# Patient Record
Sex: Female | Born: 1940 | Race: White | Hispanic: No | Marital: Married | Smoking: Never smoker
Health system: Southern US, Community
[De-identification: ages and names within clinical notes are randomized; demographics above are authoritative.]

## PROBLEM LIST (undated history)

## (undated) DIAGNOSIS — S4992XA Unspecified injury of left shoulder and upper arm, initial encounter: Secondary | ICD-10-CM

## (undated) DIAGNOSIS — Z8719 Personal history of other diseases of the digestive system: Secondary | ICD-10-CM

## (undated) DIAGNOSIS — B029 Zoster without complications: Secondary | ICD-10-CM

## (undated) DIAGNOSIS — K219 Gastro-esophageal reflux disease without esophagitis: Secondary | ICD-10-CM

## (undated) DIAGNOSIS — Z8669 Personal history of other diseases of the nervous system and sense organs: Secondary | ICD-10-CM

## (undated) DIAGNOSIS — F329 Major depressive disorder, single episode, unspecified: Secondary | ICD-10-CM

## (undated) DIAGNOSIS — M25561 Pain in right knee: Secondary | ICD-10-CM

## (undated) DIAGNOSIS — H02409 Unspecified ptosis of unspecified eyelid: Secondary | ICD-10-CM

## (undated) DIAGNOSIS — H5440 Blindness, one eye, unspecified eye: Secondary | ICD-10-CM

## (undated) DIAGNOSIS — E039 Hypothyroidism, unspecified: Secondary | ICD-10-CM

## (undated) DIAGNOSIS — K5792 Diverticulitis of intestine, part unspecified, without perforation or abscess without bleeding: Secondary | ICD-10-CM

## (undated) DIAGNOSIS — F32A Depression, unspecified: Secondary | ICD-10-CM

## (undated) DIAGNOSIS — C9210 Chronic myeloid leukemia, BCR/ABL-positive, not having achieved remission: Secondary | ICD-10-CM

## (undated) DIAGNOSIS — M1990 Unspecified osteoarthritis, unspecified site: Secondary | ICD-10-CM

## (undated) DIAGNOSIS — Z856 Personal history of leukemia: Secondary | ICD-10-CM

## (undated) DIAGNOSIS — Z973 Presence of spectacles and contact lenses: Secondary | ICD-10-CM

## (undated) HISTORY — PX: OTHER SURGICAL HISTORY: SHX169

## (undated) HISTORY — PX: COLONOSCOPY: SHX174

## (undated) HISTORY — DX: Unspecified ptosis of unspecified eyelid: H02.409

## (undated) HISTORY — DX: Chronic myeloid leukemia, BCR/ABL-positive, not having achieved remission: C92.10

## (undated) HISTORY — PX: APPENDECTOMY: SHX54

## (undated) HISTORY — DX: Unspecified osteoarthritis, unspecified site: M19.90

## (undated) HISTORY — DX: Blindness, one eye, unspecified eye: H54.40

## (undated) HISTORY — DX: Unspecified injury of left shoulder and upper arm, initial encounter: S49.92XA

## (undated) HISTORY — DX: Pain in right knee: M25.561

## (undated) HISTORY — DX: Personal history of other diseases of the nervous system and sense organs: Z86.69

---

## 1978-07-11 HISTORY — PX: OTHER SURGICAL HISTORY: SHX169

## 1998-11-19 ENCOUNTER — Inpatient Hospital Stay (HOSPITAL_COMMUNITY): Admission: AD | Admit: 1998-11-19 | Discharge: 1998-11-21 | Payer: Self-pay | Admitting: *Deleted

## 1998-11-20 ENCOUNTER — Encounter: Payer: Self-pay | Admitting: *Deleted

## 2001-03-30 ENCOUNTER — Ambulatory Visit (HOSPITAL_COMMUNITY): Admission: RE | Admit: 2001-03-30 | Discharge: 2001-03-30 | Payer: Self-pay | Admitting: Internal Medicine

## 2001-03-30 ENCOUNTER — Encounter: Admission: RE | Admit: 2001-03-30 | Discharge: 2001-03-30 | Payer: Self-pay | Admitting: Oncology

## 2001-03-30 ENCOUNTER — Encounter (INDEPENDENT_AMBULATORY_CARE_PROVIDER_SITE_OTHER): Payer: Self-pay | Admitting: Internal Medicine

## 2001-03-30 ENCOUNTER — Encounter (HOSPITAL_COMMUNITY): Admission: RE | Admit: 2001-03-30 | Discharge: 2001-04-29 | Payer: Self-pay | Admitting: Oncology

## 2001-04-30 ENCOUNTER — Encounter (HOSPITAL_COMMUNITY): Admission: RE | Admit: 2001-04-30 | Discharge: 2001-05-30 | Payer: Self-pay | Admitting: Oncology

## 2001-04-30 ENCOUNTER — Encounter: Admission: RE | Admit: 2001-04-30 | Discharge: 2001-04-30 | Payer: Self-pay | Admitting: Oncology

## 2001-06-12 ENCOUNTER — Encounter (HOSPITAL_COMMUNITY): Admission: RE | Admit: 2001-06-12 | Discharge: 2001-07-12 | Payer: Self-pay | Admitting: Oncology

## 2001-06-12 ENCOUNTER — Encounter: Admission: RE | Admit: 2001-06-12 | Discharge: 2001-06-12 | Payer: Self-pay | Admitting: Oncology

## 2001-07-25 ENCOUNTER — Encounter (HOSPITAL_COMMUNITY): Admission: RE | Admit: 2001-07-25 | Discharge: 2001-08-24 | Payer: Self-pay | Admitting: Oncology

## 2001-07-25 ENCOUNTER — Encounter: Admission: RE | Admit: 2001-07-25 | Discharge: 2001-07-25 | Payer: Self-pay | Admitting: Oncology

## 2001-07-28 ENCOUNTER — Encounter: Payer: Self-pay | Admitting: Internal Medicine

## 2001-07-28 ENCOUNTER — Emergency Department (HOSPITAL_COMMUNITY): Admission: EM | Admit: 2001-07-28 | Discharge: 2001-07-28 | Payer: Self-pay | Admitting: Internal Medicine

## 2001-08-09 ENCOUNTER — Ambulatory Visit (HOSPITAL_COMMUNITY): Admission: RE | Admit: 2001-08-09 | Discharge: 2001-08-09 | Payer: Self-pay | Admitting: Pulmonary Disease

## 2001-08-31 ENCOUNTER — Encounter: Admission: RE | Admit: 2001-08-31 | Discharge: 2001-08-31 | Payer: Self-pay | Admitting: Oncology

## 2001-08-31 ENCOUNTER — Encounter (HOSPITAL_COMMUNITY): Admission: RE | Admit: 2001-08-31 | Discharge: 2001-09-30 | Payer: Self-pay | Admitting: Oncology

## 2001-10-22 ENCOUNTER — Encounter (HOSPITAL_COMMUNITY): Admission: RE | Admit: 2001-10-22 | Discharge: 2001-11-21 | Payer: Self-pay | Admitting: Oncology

## 2001-10-22 ENCOUNTER — Encounter: Admission: RE | Admit: 2001-10-22 | Discharge: 2001-10-22 | Payer: Self-pay | Admitting: Oncology

## 2001-10-31 ENCOUNTER — Observation Stay (HOSPITAL_COMMUNITY): Admission: AD | Admit: 2001-10-31 | Discharge: 2001-11-02 | Payer: Self-pay | Admitting: Pulmonary Disease

## 2001-11-27 ENCOUNTER — Encounter (HOSPITAL_COMMUNITY): Admission: RE | Admit: 2001-11-27 | Discharge: 2001-12-27 | Payer: Self-pay | Admitting: Oncology

## 2001-11-27 ENCOUNTER — Encounter: Admission: RE | Admit: 2001-11-27 | Discharge: 2001-11-27 | Payer: Self-pay | Admitting: Oncology

## 2001-12-31 ENCOUNTER — Encounter: Admission: RE | Admit: 2001-12-31 | Discharge: 2001-12-31 | Payer: Self-pay | Admitting: Oncology

## 2001-12-31 ENCOUNTER — Encounter (HOSPITAL_COMMUNITY): Admission: RE | Admit: 2001-12-31 | Discharge: 2002-01-30 | Payer: Self-pay | Admitting: Oncology

## 2002-02-06 ENCOUNTER — Encounter: Admission: RE | Admit: 2002-02-06 | Discharge: 2002-02-06 | Payer: Self-pay | Admitting: Oncology

## 2002-02-06 ENCOUNTER — Encounter (HOSPITAL_COMMUNITY): Admission: RE | Admit: 2002-02-06 | Discharge: 2002-03-08 | Payer: Self-pay | Admitting: Oncology

## 2002-03-27 ENCOUNTER — Encounter: Admission: RE | Admit: 2002-03-27 | Discharge: 2002-03-27 | Payer: Self-pay | Admitting: Oncology

## 2002-03-27 ENCOUNTER — Encounter (HOSPITAL_COMMUNITY): Admission: RE | Admit: 2002-03-27 | Discharge: 2002-04-26 | Payer: Self-pay | Admitting: Oncology

## 2002-04-09 ENCOUNTER — Ambulatory Visit (HOSPITAL_COMMUNITY): Admission: RE | Admit: 2002-04-09 | Discharge: 2002-04-09 | Payer: Self-pay | Admitting: Internal Medicine

## 2002-04-10 ENCOUNTER — Encounter (INDEPENDENT_AMBULATORY_CARE_PROVIDER_SITE_OTHER): Payer: Self-pay | Admitting: Internal Medicine

## 2002-04-10 ENCOUNTER — Ambulatory Visit (HOSPITAL_COMMUNITY): Admission: RE | Admit: 2002-04-10 | Discharge: 2002-04-10 | Payer: Self-pay | Admitting: Internal Medicine

## 2002-04-26 ENCOUNTER — Encounter (HOSPITAL_COMMUNITY): Admission: RE | Admit: 2002-04-26 | Discharge: 2002-05-26 | Payer: Self-pay | Admitting: Oncology

## 2002-04-26 ENCOUNTER — Encounter: Admission: RE | Admit: 2002-04-26 | Discharge: 2002-04-26 | Payer: Self-pay | Admitting: Oncology

## 2002-05-24 ENCOUNTER — Encounter: Payer: Self-pay | Admitting: General Surgery

## 2002-05-24 ENCOUNTER — Ambulatory Visit (HOSPITAL_COMMUNITY): Admission: RE | Admit: 2002-05-24 | Discharge: 2002-05-24 | Payer: Self-pay | Admitting: General Surgery

## 2002-06-10 ENCOUNTER — Encounter: Admission: RE | Admit: 2002-06-10 | Discharge: 2002-06-10 | Payer: Self-pay | Admitting: Oncology

## 2002-06-10 ENCOUNTER — Encounter (HOSPITAL_COMMUNITY): Admission: RE | Admit: 2002-06-10 | Discharge: 2002-07-10 | Payer: Self-pay | Admitting: Oncology

## 2002-06-21 ENCOUNTER — Ambulatory Visit (HOSPITAL_COMMUNITY): Admission: RE | Admit: 2002-06-21 | Discharge: 2002-06-21 | Payer: Self-pay | Admitting: Cardiology

## 2002-07-17 ENCOUNTER — Encounter: Admission: RE | Admit: 2002-07-17 | Discharge: 2002-07-17 | Payer: Self-pay | Admitting: Oncology

## 2002-07-17 ENCOUNTER — Encounter (HOSPITAL_COMMUNITY): Admission: RE | Admit: 2002-07-17 | Discharge: 2002-08-16 | Payer: Self-pay | Admitting: Oncology

## 2002-08-19 ENCOUNTER — Encounter (HOSPITAL_COMMUNITY): Admission: RE | Admit: 2002-08-19 | Discharge: 2002-09-18 | Payer: Self-pay | Admitting: Oncology

## 2002-08-19 ENCOUNTER — Encounter: Admission: RE | Admit: 2002-08-19 | Discharge: 2002-08-19 | Payer: Self-pay | Admitting: Oncology

## 2002-09-30 ENCOUNTER — Encounter: Admission: RE | Admit: 2002-09-30 | Discharge: 2002-09-30 | Payer: Self-pay | Admitting: Oncology

## 2002-09-30 ENCOUNTER — Encounter (HOSPITAL_COMMUNITY): Admission: RE | Admit: 2002-09-30 | Discharge: 2002-10-30 | Payer: Self-pay | Admitting: Oncology

## 2002-11-04 ENCOUNTER — Encounter (HOSPITAL_COMMUNITY): Payer: Self-pay | Admitting: Oncology

## 2002-11-04 ENCOUNTER — Encounter (HOSPITAL_COMMUNITY): Admission: RE | Admit: 2002-11-04 | Discharge: 2002-12-04 | Payer: Self-pay | Admitting: Oncology

## 2002-11-05 ENCOUNTER — Encounter: Admission: RE | Admit: 2002-11-05 | Discharge: 2002-11-05 | Payer: Self-pay | Admitting: Oncology

## 2002-11-09 ENCOUNTER — Emergency Department (HOSPITAL_COMMUNITY): Admission: EM | Admit: 2002-11-09 | Discharge: 2002-11-09 | Payer: Self-pay | Admitting: Emergency Medicine

## 2002-11-09 ENCOUNTER — Encounter: Payer: Self-pay | Admitting: Emergency Medicine

## 2002-12-10 ENCOUNTER — Encounter (HOSPITAL_COMMUNITY): Admission: RE | Admit: 2002-12-10 | Discharge: 2003-01-09 | Payer: Self-pay | Admitting: Oncology

## 2002-12-10 ENCOUNTER — Encounter: Admission: RE | Admit: 2002-12-10 | Discharge: 2002-12-10 | Payer: Self-pay | Admitting: Oncology

## 2002-12-11 ENCOUNTER — Ambulatory Visit (HOSPITAL_COMMUNITY): Admission: RE | Admit: 2002-12-11 | Discharge: 2002-12-11 | Payer: Self-pay | Admitting: Oncology

## 2002-12-11 ENCOUNTER — Encounter (HOSPITAL_COMMUNITY): Payer: Self-pay | Admitting: Oncology

## 2003-01-20 ENCOUNTER — Encounter (HOSPITAL_COMMUNITY): Admission: RE | Admit: 2003-01-20 | Discharge: 2003-02-19 | Payer: Self-pay | Admitting: Oncology

## 2003-01-20 ENCOUNTER — Encounter: Admission: RE | Admit: 2003-01-20 | Discharge: 2003-01-20 | Payer: Self-pay | Admitting: Oncology

## 2003-03-06 ENCOUNTER — Encounter (HOSPITAL_COMMUNITY): Admission: RE | Admit: 2003-03-06 | Discharge: 2003-04-05 | Payer: Self-pay | Admitting: Oncology

## 2003-03-06 ENCOUNTER — Encounter: Admission: RE | Admit: 2003-03-06 | Discharge: 2003-03-06 | Payer: Self-pay | Admitting: Oncology

## 2003-04-11 ENCOUNTER — Encounter (HOSPITAL_COMMUNITY): Admission: RE | Admit: 2003-04-11 | Discharge: 2003-05-11 | Payer: Self-pay | Admitting: Oncology

## 2003-04-11 ENCOUNTER — Encounter: Admission: RE | Admit: 2003-04-11 | Discharge: 2003-04-11 | Payer: Self-pay | Admitting: Oncology

## 2003-05-19 ENCOUNTER — Encounter (HOSPITAL_COMMUNITY): Admission: RE | Admit: 2003-05-19 | Discharge: 2003-06-18 | Payer: Self-pay | Admitting: Oncology

## 2003-05-19 ENCOUNTER — Encounter: Admission: RE | Admit: 2003-05-19 | Discharge: 2003-05-19 | Payer: Self-pay | Admitting: Oncology

## 2003-05-22 ENCOUNTER — Ambulatory Visit (HOSPITAL_COMMUNITY): Admission: RE | Admit: 2003-05-22 | Discharge: 2003-05-22 | Payer: Self-pay | Admitting: Pulmonary Disease

## 2003-06-25 ENCOUNTER — Encounter: Admission: RE | Admit: 2003-06-25 | Discharge: 2003-06-25 | Payer: Self-pay | Admitting: Oncology

## 2003-06-25 ENCOUNTER — Encounter (HOSPITAL_COMMUNITY): Admission: RE | Admit: 2003-06-25 | Discharge: 2003-07-25 | Payer: Self-pay | Admitting: Oncology

## 2003-08-07 ENCOUNTER — Encounter (HOSPITAL_COMMUNITY): Admission: RE | Admit: 2003-08-07 | Discharge: 2003-09-06 | Payer: Self-pay | Admitting: Oncology

## 2003-08-07 ENCOUNTER — Encounter: Admission: RE | Admit: 2003-08-07 | Discharge: 2003-08-07 | Payer: Self-pay | Admitting: Oncology

## 2003-09-04 ENCOUNTER — Ambulatory Visit (HOSPITAL_COMMUNITY): Admission: RE | Admit: 2003-09-04 | Discharge: 2003-09-04 | Payer: Self-pay | Admitting: Pulmonary Disease

## 2003-09-09 ENCOUNTER — Encounter (HOSPITAL_COMMUNITY): Admission: RE | Admit: 2003-09-09 | Discharge: 2003-10-09 | Payer: Self-pay | Admitting: Oncology

## 2003-09-09 ENCOUNTER — Encounter: Admission: RE | Admit: 2003-09-09 | Discharge: 2003-09-09 | Payer: Self-pay | Admitting: Oncology

## 2003-11-12 ENCOUNTER — Encounter (HOSPITAL_COMMUNITY): Admission: RE | Admit: 2003-11-12 | Discharge: 2003-12-12 | Payer: Self-pay | Admitting: Oncology

## 2003-11-12 ENCOUNTER — Encounter: Admission: RE | Admit: 2003-11-12 | Discharge: 2003-11-12 | Payer: Self-pay | Admitting: Oncology

## 2003-12-23 ENCOUNTER — Encounter: Admission: RE | Admit: 2003-12-23 | Discharge: 2003-12-23 | Payer: Self-pay | Admitting: Oncology

## 2003-12-23 ENCOUNTER — Encounter (HOSPITAL_COMMUNITY): Admission: RE | Admit: 2003-12-23 | Discharge: 2004-01-22 | Payer: Self-pay | Admitting: Oncology

## 2004-02-04 ENCOUNTER — Encounter: Admission: RE | Admit: 2004-02-04 | Discharge: 2004-02-04 | Payer: Self-pay | Admitting: Oncology

## 2004-02-04 ENCOUNTER — Encounter (HOSPITAL_COMMUNITY): Admission: RE | Admit: 2004-02-04 | Discharge: 2004-03-05 | Payer: Self-pay | Admitting: Oncology

## 2004-03-23 ENCOUNTER — Encounter: Admission: RE | Admit: 2004-03-23 | Discharge: 2004-04-09 | Payer: Self-pay | Admitting: Oncology

## 2004-03-23 ENCOUNTER — Encounter (HOSPITAL_COMMUNITY): Admission: RE | Admit: 2004-03-23 | Discharge: 2004-04-09 | Payer: Self-pay | Admitting: Oncology

## 2004-04-20 ENCOUNTER — Encounter (HOSPITAL_COMMUNITY): Admission: RE | Admit: 2004-04-20 | Discharge: 2004-05-20 | Payer: Self-pay | Admitting: Oncology

## 2004-04-20 ENCOUNTER — Encounter: Admission: RE | Admit: 2004-04-20 | Discharge: 2004-04-20 | Payer: Self-pay | Admitting: Oncology

## 2004-05-19 ENCOUNTER — Ambulatory Visit (HOSPITAL_COMMUNITY): Payer: Self-pay | Admitting: Oncology

## 2004-05-25 ENCOUNTER — Encounter: Admission: RE | Admit: 2004-05-25 | Discharge: 2004-05-25 | Payer: Self-pay | Admitting: Oncology

## 2004-05-25 ENCOUNTER — Encounter (HOSPITAL_COMMUNITY): Admission: RE | Admit: 2004-05-25 | Discharge: 2004-06-24 | Payer: Self-pay | Admitting: Oncology

## 2004-07-20 ENCOUNTER — Encounter (HOSPITAL_COMMUNITY): Admission: RE | Admit: 2004-07-20 | Discharge: 2004-08-19 | Payer: Self-pay | Admitting: Oncology

## 2004-07-20 ENCOUNTER — Encounter: Admission: RE | Admit: 2004-07-20 | Discharge: 2004-07-20 | Payer: Self-pay | Admitting: Oncology

## 2004-07-21 ENCOUNTER — Ambulatory Visit (HOSPITAL_COMMUNITY): Payer: Self-pay | Admitting: Oncology

## 2004-09-06 ENCOUNTER — Ambulatory Visit (HOSPITAL_COMMUNITY): Payer: Self-pay | Admitting: Oncology

## 2004-09-06 ENCOUNTER — Encounter (HOSPITAL_COMMUNITY): Admission: RE | Admit: 2004-09-06 | Discharge: 2004-10-06 | Payer: Self-pay | Admitting: Oncology

## 2004-09-06 ENCOUNTER — Encounter: Admission: RE | Admit: 2004-09-06 | Discharge: 2004-09-06 | Payer: Self-pay | Admitting: Oncology

## 2004-10-19 ENCOUNTER — Encounter (HOSPITAL_COMMUNITY): Admission: RE | Admit: 2004-10-19 | Discharge: 2004-11-18 | Payer: Self-pay | Admitting: Oncology

## 2004-10-19 ENCOUNTER — Encounter: Admission: RE | Admit: 2004-10-19 | Discharge: 2004-10-19 | Payer: Self-pay | Admitting: Oncology

## 2004-11-14 ENCOUNTER — Emergency Department (HOSPITAL_COMMUNITY): Admission: EM | Admit: 2004-11-14 | Discharge: 2004-11-14 | Payer: Self-pay | Admitting: Emergency Medicine

## 2004-11-18 ENCOUNTER — Ambulatory Visit (HOSPITAL_COMMUNITY): Payer: Self-pay | Admitting: Oncology

## 2004-12-15 ENCOUNTER — Encounter (HOSPITAL_COMMUNITY): Admission: RE | Admit: 2004-12-15 | Discharge: 2005-01-14 | Payer: Self-pay | Admitting: Oncology

## 2004-12-15 ENCOUNTER — Encounter: Admission: RE | Admit: 2004-12-15 | Discharge: 2004-12-15 | Payer: Self-pay | Admitting: Oncology

## 2005-01-14 ENCOUNTER — Ambulatory Visit (HOSPITAL_COMMUNITY): Payer: Self-pay | Admitting: Oncology

## 2005-02-10 ENCOUNTER — Encounter (HOSPITAL_COMMUNITY): Admission: RE | Admit: 2005-02-10 | Discharge: 2005-03-12 | Payer: Self-pay | Admitting: Oncology

## 2005-02-10 ENCOUNTER — Encounter: Admission: RE | Admit: 2005-02-10 | Discharge: 2005-02-10 | Payer: Self-pay | Admitting: Oncology

## 2005-03-11 ENCOUNTER — Ambulatory Visit (HOSPITAL_COMMUNITY): Payer: Self-pay | Admitting: Oncology

## 2005-03-28 ENCOUNTER — Encounter: Admission: RE | Admit: 2005-03-28 | Discharge: 2005-04-08 | Payer: Self-pay | Admitting: Oncology

## 2005-03-28 ENCOUNTER — Encounter (HOSPITAL_COMMUNITY): Admission: RE | Admit: 2005-03-28 | Discharge: 2005-04-08 | Payer: Self-pay | Admitting: Oncology

## 2005-04-18 ENCOUNTER — Ambulatory Visit (HOSPITAL_COMMUNITY): Admission: RE | Admit: 2005-04-18 | Discharge: 2005-04-18 | Payer: Self-pay | Admitting: Pulmonary Disease

## 2005-04-28 ENCOUNTER — Ambulatory Visit (HOSPITAL_COMMUNITY): Payer: Self-pay | Admitting: Oncology

## 2005-04-28 ENCOUNTER — Encounter (HOSPITAL_COMMUNITY): Admission: RE | Admit: 2005-04-28 | Discharge: 2005-05-28 | Payer: Self-pay | Admitting: Oncology

## 2005-04-28 ENCOUNTER — Encounter: Admission: RE | Admit: 2005-04-28 | Discharge: 2005-04-28 | Payer: Self-pay | Admitting: Oncology

## 2005-06-24 ENCOUNTER — Ambulatory Visit (HOSPITAL_COMMUNITY): Payer: Self-pay | Admitting: Oncology

## 2005-06-24 ENCOUNTER — Encounter: Admission: RE | Admit: 2005-06-24 | Discharge: 2005-07-10 | Payer: Self-pay | Admitting: Oncology

## 2005-06-24 ENCOUNTER — Encounter (HOSPITAL_COMMUNITY): Admission: RE | Admit: 2005-06-24 | Discharge: 2005-07-10 | Payer: Self-pay | Admitting: Oncology

## 2005-08-10 ENCOUNTER — Encounter (HOSPITAL_COMMUNITY): Admission: RE | Admit: 2005-08-10 | Discharge: 2005-09-09 | Payer: Self-pay | Admitting: Oncology

## 2005-08-10 ENCOUNTER — Ambulatory Visit (HOSPITAL_COMMUNITY): Payer: Self-pay | Admitting: Oncology

## 2005-08-10 ENCOUNTER — Encounter: Admission: RE | Admit: 2005-08-10 | Discharge: 2005-08-10 | Payer: Self-pay | Admitting: Oncology

## 2005-10-05 ENCOUNTER — Ambulatory Visit (HOSPITAL_COMMUNITY): Payer: Self-pay | Admitting: Oncology

## 2005-10-05 ENCOUNTER — Encounter (HOSPITAL_COMMUNITY): Admission: RE | Admit: 2005-10-05 | Discharge: 2005-11-04 | Payer: Self-pay | Admitting: Oncology

## 2005-10-05 ENCOUNTER — Encounter: Admission: RE | Admit: 2005-10-05 | Discharge: 2005-10-05 | Payer: Self-pay | Admitting: Oncology

## 2005-11-30 ENCOUNTER — Encounter: Admission: RE | Admit: 2005-11-30 | Discharge: 2005-11-30 | Payer: Self-pay | Admitting: Oncology

## 2005-11-30 ENCOUNTER — Encounter (HOSPITAL_COMMUNITY): Admission: RE | Admit: 2005-11-30 | Discharge: 2005-12-30 | Payer: Self-pay | Admitting: Oncology

## 2005-11-30 ENCOUNTER — Ambulatory Visit (HOSPITAL_COMMUNITY): Payer: Self-pay | Admitting: Oncology

## 2006-01-30 ENCOUNTER — Encounter: Admission: RE | Admit: 2006-01-30 | Discharge: 2006-01-30 | Payer: Self-pay | Admitting: Oncology

## 2006-01-30 ENCOUNTER — Encounter (HOSPITAL_COMMUNITY): Admission: RE | Admit: 2006-01-30 | Discharge: 2006-03-01 | Payer: Self-pay | Admitting: Oncology

## 2006-01-30 ENCOUNTER — Ambulatory Visit (HOSPITAL_COMMUNITY): Payer: Self-pay | Admitting: Oncology

## 2006-03-08 ENCOUNTER — Encounter (HOSPITAL_COMMUNITY): Admission: RE | Admit: 2006-03-08 | Discharge: 2006-04-07 | Payer: Self-pay | Admitting: Oncology

## 2006-03-08 ENCOUNTER — Encounter: Admission: RE | Admit: 2006-03-08 | Discharge: 2006-04-07 | Payer: Self-pay | Admitting: Oncology

## 2006-03-30 ENCOUNTER — Ambulatory Visit (HOSPITAL_COMMUNITY): Payer: Self-pay | Admitting: Oncology

## 2006-04-28 ENCOUNTER — Encounter (HOSPITAL_COMMUNITY): Admission: RE | Admit: 2006-04-28 | Discharge: 2006-05-28 | Payer: Self-pay | Admitting: Oncology

## 2006-04-28 ENCOUNTER — Encounter: Admission: RE | Admit: 2006-04-28 | Discharge: 2006-04-28 | Payer: Self-pay | Admitting: Oncology

## 2006-05-21 ENCOUNTER — Emergency Department (HOSPITAL_COMMUNITY): Admission: EM | Admit: 2006-05-21 | Discharge: 2006-05-22 | Payer: Self-pay | Admitting: Emergency Medicine

## 2006-05-30 ENCOUNTER — Ambulatory Visit (HOSPITAL_COMMUNITY): Payer: Self-pay | Admitting: Oncology

## 2006-05-30 ENCOUNTER — Encounter (HOSPITAL_COMMUNITY): Admission: RE | Admit: 2006-05-30 | Discharge: 2006-06-29 | Payer: Self-pay | Admitting: Oncology

## 2006-08-09 ENCOUNTER — Ambulatory Visit (HOSPITAL_COMMUNITY): Payer: Self-pay | Admitting: Oncology

## 2006-09-20 ENCOUNTER — Encounter (HOSPITAL_COMMUNITY): Admission: RE | Admit: 2006-09-20 | Discharge: 2006-10-20 | Payer: Self-pay | Admitting: Oncology

## 2006-11-01 ENCOUNTER — Encounter (HOSPITAL_COMMUNITY): Admission: RE | Admit: 2006-11-01 | Discharge: 2006-12-01 | Payer: Self-pay | Admitting: Oncology

## 2006-11-01 ENCOUNTER — Ambulatory Visit (HOSPITAL_COMMUNITY): Payer: Self-pay | Admitting: Oncology

## 2006-12-15 ENCOUNTER — Encounter (HOSPITAL_COMMUNITY): Admission: RE | Admit: 2006-12-15 | Discharge: 2007-01-14 | Payer: Self-pay | Admitting: Oncology

## 2006-12-25 ENCOUNTER — Ambulatory Visit (HOSPITAL_COMMUNITY): Payer: Self-pay | Admitting: Oncology

## 2007-01-26 ENCOUNTER — Ambulatory Visit (HOSPITAL_COMMUNITY): Admission: RE | Admit: 2007-01-26 | Discharge: 2007-01-26 | Payer: Self-pay | Admitting: Pulmonary Disease

## 2007-01-26 ENCOUNTER — Encounter (HOSPITAL_COMMUNITY): Admission: RE | Admit: 2007-01-26 | Discharge: 2007-02-25 | Payer: Self-pay | Admitting: Oncology

## 2007-03-01 ENCOUNTER — Ambulatory Visit (HOSPITAL_COMMUNITY): Admission: RE | Admit: 2007-03-01 | Discharge: 2007-03-01 | Payer: Self-pay | Admitting: Pulmonary Disease

## 2007-03-26 ENCOUNTER — Ambulatory Visit (HOSPITAL_COMMUNITY): Payer: Self-pay | Admitting: Oncology

## 2007-03-26 ENCOUNTER — Encounter (HOSPITAL_COMMUNITY): Admission: RE | Admit: 2007-03-26 | Discharge: 2007-04-10 | Payer: Self-pay | Admitting: Oncology

## 2007-04-09 ENCOUNTER — Ambulatory Visit (HOSPITAL_COMMUNITY): Admission: RE | Admit: 2007-04-09 | Discharge: 2007-04-09 | Payer: Self-pay | Admitting: Nephrology

## 2007-04-12 ENCOUNTER — Encounter (HOSPITAL_COMMUNITY): Admission: RE | Admit: 2007-04-12 | Discharge: 2007-05-12 | Payer: Self-pay | Admitting: Oncology

## 2007-04-19 ENCOUNTER — Ambulatory Visit (HOSPITAL_COMMUNITY): Admission: RE | Admit: 2007-04-19 | Discharge: 2007-04-19 | Payer: Self-pay | Admitting: Pulmonary Disease

## 2007-05-08 ENCOUNTER — Encounter (HOSPITAL_COMMUNITY): Admission: RE | Admit: 2007-05-08 | Discharge: 2007-06-07 | Payer: Self-pay | Admitting: Endocrinology

## 2007-05-25 ENCOUNTER — Ambulatory Visit (HOSPITAL_COMMUNITY): Payer: Self-pay | Admitting: Oncology

## 2007-05-25 ENCOUNTER — Encounter (HOSPITAL_COMMUNITY): Admission: RE | Admit: 2007-05-25 | Discharge: 2007-06-24 | Payer: Self-pay | Admitting: Oncology

## 2007-06-25 ENCOUNTER — Encounter (HOSPITAL_COMMUNITY): Admission: RE | Admit: 2007-06-25 | Discharge: 2007-07-11 | Payer: Self-pay | Admitting: Oncology

## 2007-08-02 ENCOUNTER — Encounter (HOSPITAL_COMMUNITY): Admission: RE | Admit: 2007-08-02 | Discharge: 2007-09-01 | Payer: Self-pay | Admitting: Oncology

## 2007-08-02 ENCOUNTER — Ambulatory Visit (HOSPITAL_COMMUNITY): Payer: Self-pay | Admitting: Oncology

## 2007-10-02 ENCOUNTER — Encounter (HOSPITAL_COMMUNITY): Admission: RE | Admit: 2007-10-02 | Discharge: 2007-11-01 | Payer: Self-pay | Admitting: Oncology

## 2007-10-03 ENCOUNTER — Ambulatory Visit (HOSPITAL_COMMUNITY): Payer: Self-pay | Admitting: Oncology

## 2007-11-16 ENCOUNTER — Encounter (HOSPITAL_COMMUNITY): Admission: RE | Admit: 2007-11-16 | Discharge: 2007-12-16 | Payer: Self-pay | Admitting: Oncology

## 2008-01-09 ENCOUNTER — Ambulatory Visit (HOSPITAL_COMMUNITY): Payer: Self-pay | Admitting: Oncology

## 2008-01-09 ENCOUNTER — Encounter (HOSPITAL_COMMUNITY): Admission: RE | Admit: 2008-01-09 | Discharge: 2008-02-08 | Payer: Self-pay | Admitting: Oncology

## 2008-02-13 ENCOUNTER — Encounter (HOSPITAL_COMMUNITY): Admission: RE | Admit: 2008-02-13 | Discharge: 2008-03-14 | Payer: Self-pay | Admitting: Oncology

## 2008-03-12 ENCOUNTER — Ambulatory Visit (HOSPITAL_COMMUNITY): Payer: Self-pay | Admitting: Oncology

## 2008-04-17 ENCOUNTER — Ambulatory Visit (HOSPITAL_COMMUNITY): Admission: RE | Admit: 2008-04-17 | Discharge: 2008-04-17 | Payer: Self-pay | Admitting: Pulmonary Disease

## 2008-05-14 ENCOUNTER — Ambulatory Visit (HOSPITAL_COMMUNITY): Payer: Self-pay | Admitting: Oncology

## 2008-05-14 ENCOUNTER — Encounter (HOSPITAL_COMMUNITY): Admission: RE | Admit: 2008-05-14 | Discharge: 2008-06-13 | Payer: Self-pay | Admitting: Oncology

## 2008-07-07 ENCOUNTER — Encounter (HOSPITAL_COMMUNITY): Admission: RE | Admit: 2008-07-07 | Discharge: 2008-08-06 | Payer: Self-pay | Admitting: Oncology

## 2008-08-14 ENCOUNTER — Ambulatory Visit (HOSPITAL_COMMUNITY): Payer: Self-pay | Admitting: Oncology

## 2008-08-14 ENCOUNTER — Encounter (HOSPITAL_COMMUNITY): Admission: RE | Admit: 2008-08-14 | Discharge: 2008-09-13 | Payer: Self-pay | Admitting: Oncology

## 2008-10-15 ENCOUNTER — Ambulatory Visit (HOSPITAL_COMMUNITY): Admission: RE | Admit: 2008-10-15 | Discharge: 2008-10-15 | Payer: Self-pay | Admitting: Ophthalmology

## 2008-10-17 ENCOUNTER — Ambulatory Visit (HOSPITAL_COMMUNITY): Payer: Self-pay | Admitting: Oncology

## 2008-11-06 ENCOUNTER — Ambulatory Visit (HOSPITAL_COMMUNITY): Admission: RE | Admit: 2008-11-06 | Discharge: 2008-11-06 | Payer: Self-pay | Admitting: Ophthalmology

## 2008-12-03 ENCOUNTER — Encounter (HOSPITAL_COMMUNITY): Admission: RE | Admit: 2008-12-03 | Discharge: 2009-01-02 | Payer: Self-pay | Admitting: Oncology

## 2008-12-03 ENCOUNTER — Ambulatory Visit (HOSPITAL_COMMUNITY): Payer: Self-pay | Admitting: Oncology

## 2008-12-19 ENCOUNTER — Ambulatory Visit (HOSPITAL_COMMUNITY): Admission: RE | Admit: 2008-12-19 | Discharge: 2008-12-19 | Payer: Self-pay | Admitting: Pulmonary Disease

## 2009-03-17 ENCOUNTER — Ambulatory Visit (HOSPITAL_COMMUNITY): Payer: Self-pay | Admitting: Oncology

## 2009-03-17 ENCOUNTER — Encounter (HOSPITAL_COMMUNITY): Admission: RE | Admit: 2009-03-17 | Discharge: 2009-04-08 | Payer: Self-pay | Admitting: Oncology

## 2009-04-21 ENCOUNTER — Ambulatory Visit (HOSPITAL_COMMUNITY): Admission: RE | Admit: 2009-04-21 | Discharge: 2009-04-21 | Payer: Self-pay | Admitting: Unknown Physician Specialty

## 2009-08-10 ENCOUNTER — Ambulatory Visit (HOSPITAL_COMMUNITY): Payer: Self-pay | Admitting: Oncology

## 2009-08-10 ENCOUNTER — Encounter (HOSPITAL_COMMUNITY): Admission: RE | Admit: 2009-08-10 | Discharge: 2009-09-09 | Payer: Self-pay | Admitting: Oncology

## 2009-10-08 ENCOUNTER — Ambulatory Visit (HOSPITAL_COMMUNITY): Payer: Self-pay | Admitting: Oncology

## 2009-10-08 ENCOUNTER — Encounter (HOSPITAL_COMMUNITY): Admission: RE | Admit: 2009-10-08 | Discharge: 2009-11-07 | Payer: Self-pay | Admitting: Oncology

## 2009-12-03 ENCOUNTER — Ambulatory Visit (HOSPITAL_COMMUNITY): Payer: Self-pay | Admitting: Oncology

## 2009-12-03 ENCOUNTER — Encounter (HOSPITAL_COMMUNITY): Admission: RE | Admit: 2009-12-03 | Discharge: 2010-01-02 | Payer: Self-pay | Admitting: Oncology

## 2010-01-06 ENCOUNTER — Encounter (HOSPITAL_COMMUNITY): Admission: RE | Admit: 2010-01-06 | Discharge: 2010-02-05 | Payer: Self-pay | Admitting: Oncology

## 2010-01-22 ENCOUNTER — Ambulatory Visit (HOSPITAL_COMMUNITY): Payer: Self-pay | Admitting: Oncology

## 2010-03-17 ENCOUNTER — Ambulatory Visit (HOSPITAL_COMMUNITY): Payer: Self-pay | Admitting: Oncology

## 2010-03-17 ENCOUNTER — Encounter (HOSPITAL_COMMUNITY)
Admission: RE | Admit: 2010-03-17 | Discharge: 2010-04-09 | Payer: Self-pay | Source: Home / Self Care | Admitting: Oncology

## 2010-04-26 ENCOUNTER — Encounter (HOSPITAL_COMMUNITY)
Admission: RE | Admit: 2010-04-26 | Discharge: 2010-05-26 | Payer: Self-pay | Source: Home / Self Care | Admitting: Oncology

## 2010-04-29 ENCOUNTER — Ambulatory Visit (HOSPITAL_COMMUNITY): Admission: RE | Admit: 2010-04-29 | Discharge: 2010-04-29 | Payer: Self-pay | Admitting: Pulmonary Disease

## 2010-05-21 ENCOUNTER — Ambulatory Visit (HOSPITAL_COMMUNITY): Admission: RE | Admit: 2010-05-21 | Discharge: 2010-05-21 | Payer: Self-pay | Admitting: General Surgery

## 2010-06-25 ENCOUNTER — Ambulatory Visit (HOSPITAL_COMMUNITY): Payer: Self-pay | Admitting: Oncology

## 2010-06-30 ENCOUNTER — Ambulatory Visit (HOSPITAL_COMMUNITY)
Admission: RE | Admit: 2010-06-30 | Discharge: 2010-06-30 | Payer: Self-pay | Source: Home / Self Care | Attending: Pulmonary Disease | Admitting: Pulmonary Disease

## 2010-07-08 ENCOUNTER — Encounter (HOSPITAL_COMMUNITY)
Admission: RE | Admit: 2010-07-08 | Discharge: 2010-08-07 | Payer: Self-pay | Source: Home / Self Care | Attending: Pulmonary Disease | Admitting: Pulmonary Disease

## 2010-07-14 ENCOUNTER — Ambulatory Visit (HOSPITAL_COMMUNITY)
Admission: RE | Admit: 2010-07-14 | Discharge: 2010-08-10 | Payer: Self-pay | Source: Home / Self Care | Attending: Oncology | Admitting: Oncology

## 2010-07-14 ENCOUNTER — Encounter (HOSPITAL_COMMUNITY)
Admission: RE | Admit: 2010-07-14 | Discharge: 2010-08-10 | Payer: Self-pay | Source: Home / Self Care | Attending: Oncology | Admitting: Oncology

## 2010-07-14 LAB — CBC
HCT: 35.6 % — ABNORMAL LOW (ref 36.0–46.0)
Hemoglobin: 11.9 g/dL — ABNORMAL LOW (ref 12.0–15.0)
MCH: 33.4 pg (ref 26.0–34.0)
MCHC: 33.4 g/dL (ref 30.0–36.0)
MCV: 100 fL (ref 78.0–100.0)
Platelets: 152 10*3/uL (ref 150–400)
RBC: 3.56 MIL/uL — ABNORMAL LOW (ref 3.87–5.11)
RDW: 17.1 % — ABNORMAL HIGH (ref 11.5–15.5)
WBC: 4 10*3/uL (ref 4.0–10.5)

## 2010-07-14 LAB — COMPREHENSIVE METABOLIC PANEL
ALT: 14 U/L (ref 0–35)
AST: 29 U/L (ref 0–37)
Albumin: 4.1 g/dL (ref 3.5–5.2)
Alkaline Phosphatase: 70 U/L (ref 39–117)
BUN: 14 mg/dL (ref 6–23)
CO2: 24 mEq/L (ref 19–32)
Calcium: 9.1 mg/dL (ref 8.4–10.5)
Chloride: 109 mEq/L (ref 96–112)
Creatinine, Ser: 1.6 mg/dL — ABNORMAL HIGH (ref 0.4–1.2)
GFR calc Af Amer: 39 mL/min — ABNORMAL LOW (ref >60)
GFR calc non Af Amer: 32 mL/min — ABNORMAL LOW (ref >60)
Glucose, Bld: 116 mg/dL — ABNORMAL HIGH (ref 70–99)
Potassium: 4.2 mEq/L (ref 3.5–5.1)
Sodium: 140 mEq/L (ref 135–145)
Total Bilirubin: 0.4 mg/dL (ref 0.3–1.2)
Total Protein: 6.2 g/dL (ref 6.0–8.3)

## 2010-07-14 LAB — DIFFERENTIAL
Basophils Absolute: 0 10*3/uL (ref 0.0–0.1)
Basophils Relative: 0 % (ref 0–1)
Eosinophils Absolute: 0.1 10*3/uL (ref 0.0–0.7)
Eosinophils Relative: 1 % (ref 0–5)
Lymphocytes Relative: 17 % (ref 12–46)
Lymphs Abs: 0.7 10*3/uL (ref 0.7–4.0)
Monocytes Absolute: 0.3 10*3/uL (ref 0.1–1.0)
Monocytes Relative: 7 % (ref 3–12)
Neutro Abs: 3 10*3/uL (ref 1.7–7.7)
Neutrophils Relative %: 75 % (ref 43–77)

## 2010-08-01 ENCOUNTER — Encounter: Payer: Self-pay | Admitting: Obstetrics & Gynecology

## 2010-08-20 ENCOUNTER — Other Ambulatory Visit (HOSPITAL_COMMUNITY): Payer: Self-pay

## 2010-08-31 ENCOUNTER — Ambulatory Visit (INDEPENDENT_AMBULATORY_CARE_PROVIDER_SITE_OTHER): Payer: Self-pay | Admitting: Internal Medicine

## 2010-09-03 ENCOUNTER — Other Ambulatory Visit (HOSPITAL_COMMUNITY): Payer: Self-pay

## 2010-09-10 ENCOUNTER — Encounter (HOSPITAL_COMMUNITY): Payer: Medicare Other | Attending: Oncology

## 2010-09-10 ENCOUNTER — Other Ambulatory Visit (HOSPITAL_COMMUNITY): Payer: Medicare Other

## 2010-09-10 DIAGNOSIS — C9210 Chronic myeloid leukemia, BCR/ABL-positive, not having achieved remission: Secondary | ICD-10-CM

## 2010-09-10 DIAGNOSIS — C9111 Chronic lymphocytic leukemia of B-cell type in remission: Secondary | ICD-10-CM | POA: Insufficient documentation

## 2010-09-17 ENCOUNTER — Other Ambulatory Visit (HOSPITAL_COMMUNITY): Payer: Self-pay

## 2010-09-20 ENCOUNTER — Ambulatory Visit (HOSPITAL_COMMUNITY): Payer: Self-pay | Admitting: Oncology

## 2010-09-21 LAB — SURGICAL PCR SCREEN: Staphylococcus aureus: NEGATIVE

## 2010-09-21 LAB — CBC
MCH: 28.5 pg (ref 26.0–34.0)
MCHC: 32.4 g/dL (ref 30.0–36.0)
Platelets: 109 10*3/uL — ABNORMAL LOW (ref 150–400)
WBC: 3.7 10*3/uL — ABNORMAL LOW (ref 4.0–10.5)

## 2010-09-21 LAB — BASIC METABOLIC PANEL: Potassium: 4 mEq/L (ref 3.5–5.1)

## 2010-09-22 LAB — DIFFERENTIAL
Basophils Absolute: 0 10*3/uL (ref 0.0–0.1)
Basophils Relative: 1 % (ref 0–1)
Eosinophils Relative: 4 % (ref 0–5)
Lymphocytes Relative: 29 % (ref 12–46)
Lymphs Abs: 1.2 10*3/uL (ref 0.7–4.0)
Neutro Abs: 2.4 10*3/uL (ref 1.7–7.7)

## 2010-09-22 LAB — COMPREHENSIVE METABOLIC PANEL
ALT: 18 U/L (ref 0–35)
Albumin: 3.9 g/dL (ref 3.5–5.2)
BUN: 19 mg/dL (ref 6–23)
CO2: 24 mEq/L (ref 19–32)
Calcium: 8.6 mg/dL (ref 8.4–10.5)
Chloride: 108 mEq/L (ref 96–112)
GFR calc Af Amer: 44 mL/min — ABNORMAL LOW (ref >60)
GFR calc non Af Amer: 37 mL/min — ABNORMAL LOW (ref >60)
Potassium: 4 mEq/L (ref 3.5–5.1)
Total Bilirubin: 0.2 mg/dL — ABNORMAL LOW (ref 0.3–1.2)
Total Protein: 6.3 g/dL (ref 6.0–8.3)

## 2010-09-22 LAB — CBC
HCT: 31.5 % — ABNORMAL LOW (ref 36.0–46.0)
Platelets: 108 10*3/uL — ABNORMAL LOW (ref 150–400)
RBC: 3.69 MIL/uL — ABNORMAL LOW (ref 3.87–5.11)
WBC: 4.2 10*3/uL (ref 4.0–10.5)

## 2010-09-23 LAB — DIFFERENTIAL
Eosinophils Relative: 6 % — ABNORMAL HIGH (ref 0–5)
Lymphs Abs: 0.5 10*3/uL — ABNORMAL LOW (ref 0.7–4.0)
Monocytes Absolute: 0.3 10*3/uL (ref 0.1–1.0)
Neutro Abs: 2.8 10*3/uL (ref 1.7–7.7)

## 2010-09-23 LAB — CBC: MCH: 27.7 pg (ref 26.0–34.0)

## 2010-09-25 LAB — CBC
MCHC: 32.2 g/dL (ref 30.0–36.0)
Platelets: 95 10*3/uL — ABNORMAL LOW (ref 150–400)
RBC: 3.74 MIL/uL — ABNORMAL LOW (ref 3.87–5.11)
RDW: 17.7 % — ABNORMAL HIGH (ref 11.5–15.5)
WBC: 3.7 10*3/uL — ABNORMAL LOW (ref 4.0–10.5)

## 2010-09-25 LAB — DIFFERENTIAL
Basophils Relative: 1 % (ref 0–1)
Eosinophils Absolute: 0.1 10*3/uL (ref 0.0–0.7)
Lymphs Abs: 0.8 10*3/uL (ref 0.7–4.0)
Monocytes Absolute: 0.3 10*3/uL (ref 0.1–1.0)
Neutro Abs: 2.5 10*3/uL (ref 1.7–7.7)

## 2010-09-26 LAB — CBC
HCT: 29.6 % — ABNORMAL LOW (ref 36.0–46.0)
HCT: 33.3 % — ABNORMAL LOW (ref 36.0–46.0)
Hemoglobin: 9.9 g/dL — ABNORMAL LOW (ref 12.0–15.0)
Hemoglobin: 10.8 g/dL — ABNORMAL LOW (ref 12.0–15.0)
MCH: 32.1 pg (ref 26.0–34.0)
MCHC: 32.5 g/dL (ref 30.0–36.0)
MCV: 100.3 fL — ABNORMAL HIGH (ref 78.0–100.0)
MCV: 98.6 fL (ref 78.0–100.0)
RBC: 3.38 MIL/uL — ABNORMAL LOW (ref 3.87–5.11)
RDW: 15.3 % (ref 11.5–15.5)
WBC: 3.9 10*3/uL — ABNORMAL LOW (ref 4.0–10.5)

## 2010-09-26 LAB — COMPREHENSIVE METABOLIC PANEL
ALT: 17 U/L (ref 0–35)
Albumin: 3.8 g/dL (ref 3.5–5.2)
Alkaline Phosphatase: 69 U/L (ref 39–117)
Creatinine, Ser: 1.42 mg/dL — ABNORMAL HIGH (ref 0.4–1.2)
GFR calc Af Amer: 44 mL/min — ABNORMAL LOW (ref >60)
Glucose, Bld: 82 mg/dL (ref 70–99)
Total Protein: 6 g/dL (ref 6.0–8.3)

## 2010-09-26 LAB — DIFFERENTIAL
Basophils Relative: 0 % (ref 0–1)
Eosinophils Absolute: 0.2 10*3/uL (ref 0.0–0.7)
Eosinophils Relative: 4 % (ref 0–5)
Lymphocytes Relative: 23 % (ref 12–46)
Lymphocytes Relative: 18 % (ref 12–46)
Monocytes Absolute: 0.3 10*3/uL (ref 0.1–1.0)
Monocytes Absolute: 0.3 10*3/uL (ref 0.1–1.0)
Neutro Abs: 2.7 10*3/uL (ref 1.7–7.7)

## 2010-09-27 LAB — DIFFERENTIAL
Basophils Absolute: 0 10*3/uL (ref 0.0–0.1)
Basophils Relative: 0 % (ref 0–1)
Blasts: 0 %
Eosinophils Absolute: 0.2 10*3/uL (ref 0.0–0.7)
Eosinophils Relative: 3 % (ref 0–5)
Metamyelocytes Relative: 0 %
Monocytes Absolute: 0.2 10*3/uL (ref 0.1–1.0)
Myelocytes: 0 %
Neutrophils Relative %: 69 % (ref 43–77)
Promyelocytes Absolute: 0 %
nRBC: 0 /100 WBC

## 2010-09-27 LAB — COMPREHENSIVE METABOLIC PANEL
Alkaline Phosphatase: 61 U/L (ref 39–117)
BUN: 13 mg/dL (ref 6–23)
CO2: 25 mEq/L (ref 19–32)
GFR calc non Af Amer: 33 mL/min — ABNORMAL LOW (ref >60)
Total Bilirubin: 0.1 mg/dL — ABNORMAL LOW (ref 0.3–1.2)

## 2010-09-27 LAB — CBC
MCHC: 31.5 g/dL (ref 30.0–36.0)
MCV: 98.5 fL (ref 78.0–100.0)
RBC: 2.95 MIL/uL — ABNORMAL LOW (ref 3.87–5.11)
RDW: 16.5 % — ABNORMAL HIGH (ref 11.5–15.5)
WBC: 5.4 10*3/uL (ref 4.0–10.5)

## 2010-10-01 ENCOUNTER — Other Ambulatory Visit (HOSPITAL_COMMUNITY): Payer: Self-pay

## 2010-10-03 LAB — COMPREHENSIVE METABOLIC PANEL
ALT: 19 U/L (ref 0–35)
AST: 29 U/L (ref 0–37)
Alkaline Phosphatase: 57 U/L (ref 39–117)
CO2: 24 mEq/L (ref 19–32)
GFR calc Af Amer: 47 mL/min — ABNORMAL LOW (ref >60)
Glucose, Bld: 124 mg/dL — ABNORMAL HIGH (ref 70–99)
Potassium: 3.6 mEq/L (ref 3.5–5.1)
Sodium: 140 mEq/L (ref 135–145)
Total Protein: 5.5 g/dL — ABNORMAL LOW (ref 6.0–8.3)

## 2010-10-03 LAB — DIFFERENTIAL
Basophils Relative: 1 % (ref 0–1)
Eosinophils Absolute: 0.1 10*3/uL (ref 0.0–0.7)
Eosinophils Relative: 3 % (ref 0–5)
Monocytes Relative: 7 % (ref 3–12)
Neutrophils Relative %: 68 % (ref 43–77)

## 2010-10-03 LAB — CBC
Hemoglobin: 9.4 g/dL — ABNORMAL LOW (ref 12.0–15.0)
RBC: 2.8 MIL/uL — ABNORMAL LOW (ref 3.87–5.11)
RDW: 16.1 % — ABNORMAL HIGH (ref 11.5–15.5)

## 2010-10-03 LAB — BCR/ABL GENE REARRANGEMENT QNT, PCR

## 2010-10-04 ENCOUNTER — Ambulatory Visit (HOSPITAL_COMMUNITY): Payer: Self-pay | Admitting: Oncology

## 2010-10-15 LAB — DIFFERENTIAL
Basophils Absolute: 0 10*3/uL (ref 0.0–0.1)
Eosinophils Relative: 3 % (ref 0–5)
Lymphocytes Relative: 21 % (ref 12–46)
Lymphs Abs: 1 10*3/uL (ref 0.7–4.0)
Monocytes Absolute: 0.4 10*3/uL (ref 0.1–1.0)
Neutro Abs: 3.3 10*3/uL (ref 1.7–7.7)

## 2010-10-15 LAB — COMPREHENSIVE METABOLIC PANEL
AST: 30 U/L (ref 0–37)
Albumin: 3.6 g/dL (ref 3.5–5.2)
BUN: 17 mg/dL (ref 6–23)
Calcium: 8.8 mg/dL (ref 8.4–10.5)
Creatinine, Ser: 1.48 mg/dL — ABNORMAL HIGH (ref 0.4–1.2)
GFR calc Af Amer: 42 mL/min — ABNORMAL LOW (ref >60)
Total Protein: 5.9 g/dL — ABNORMAL LOW (ref 6.0–8.3)

## 2010-10-15 LAB — CBC
HCT: 30.7 % — ABNORMAL LOW (ref 36.0–46.0)
MCHC: 34.8 g/dL (ref 30.0–36.0)
MCV: 101.9 fL — ABNORMAL HIGH (ref 78.0–100.0)
Platelets: 89 10*3/uL — ABNORMAL LOW (ref 150–400)
RDW: 15.8 % — ABNORMAL HIGH (ref 11.5–15.5)
WBC: 4.8 10*3/uL (ref 4.0–10.5)

## 2010-10-19 LAB — COMPREHENSIVE METABOLIC PANEL
ALT: 21 U/L (ref 0–35)
Alkaline Phosphatase: 55 U/L (ref 39–117)
BUN: 19 mg/dL (ref 6–23)
CO2: 28 mEq/L (ref 19–32)
Calcium: 8.7 mg/dL (ref 8.4–10.5)
GFR calc non Af Amer: 28 mL/min — ABNORMAL LOW (ref >60)
Glucose, Bld: 123 mg/dL — ABNORMAL HIGH (ref 70–99)
Potassium: 3.1 mEq/L — ABNORMAL LOW (ref 3.5–5.1)
Sodium: 139 mEq/L (ref 135–145)
Total Protein: 5.9 g/dL — ABNORMAL LOW (ref 6.0–8.3)

## 2010-10-19 LAB — CBC
HCT: 29.5 % — ABNORMAL LOW (ref 36.0–46.0)
Hemoglobin: 10.5 g/dL — ABNORMAL LOW (ref 12.0–15.0)
MCHC: 35.6 g/dL (ref 30.0–36.0)
RBC: 2.98 MIL/uL — ABNORMAL LOW (ref 3.87–5.11)
RDW: 14.5 % (ref 11.5–15.5)

## 2010-10-19 LAB — DIFFERENTIAL
Basophils Relative: 1 % (ref 0–1)
Eosinophils Absolute: 0.1 10*3/uL (ref 0.0–0.7)
Monocytes Relative: 8 % (ref 3–12)
Neutro Abs: 2.2 10*3/uL (ref 1.7–7.7)
Neutrophils Relative %: 66 % (ref 43–77)

## 2010-10-20 LAB — HEMOGLOBIN AND HEMATOCRIT, BLOOD
HCT: 30.8 % — ABNORMAL LOW (ref 36.0–46.0)
Hemoglobin: 10.7 g/dL — ABNORMAL LOW (ref 12.0–15.0)

## 2010-10-21 LAB — BASIC METABOLIC PANEL
BUN: 9 mg/dL (ref 6–23)
Calcium: 9.2 mg/dL (ref 8.4–10.5)
Creatinine, Ser: 1.07 mg/dL (ref 0.4–1.2)
GFR calc Af Amer: >60 mL/min (ref >60)
GFR calc non Af Amer: 51 mL/min — ABNORMAL LOW (ref >60)

## 2010-10-21 LAB — HEMOGLOBIN AND HEMATOCRIT, BLOOD
HCT: 28.3 % — ABNORMAL LOW (ref 36.0–46.0)
Hemoglobin: 9.6 g/dL — ABNORMAL LOW (ref 12.0–15.0)

## 2010-10-26 LAB — DIFFERENTIAL
Basophils Absolute: 0 10*3/uL (ref 0.0–0.1)
Basophils Relative: 0 % (ref 0–1)
Lymphocytes Relative: 7 % — ABNORMAL LOW (ref 12–46)
Monocytes Relative: 4 % (ref 3–12)
Neutro Abs: 6.3 10*3/uL (ref 1.7–7.7)
Neutrophils Relative %: 89 % — ABNORMAL HIGH (ref 43–77)

## 2010-10-26 LAB — CBC
HCT: 33.4 % — ABNORMAL LOW (ref 36.0–46.0)
Hemoglobin: 11.2 g/dL — ABNORMAL LOW (ref 12.0–15.0)
MCHC: 33.6 g/dL (ref 30.0–36.0)
MCV: 98.9 fL (ref 78.0–100.0)
Platelets: 97 10*3/uL — ABNORMAL LOW (ref 150–400)
RDW: 16.1 % — ABNORMAL HIGH (ref 11.5–15.5)

## 2010-10-26 LAB — COMPREHENSIVE METABOLIC PANEL
Alkaline Phosphatase: 67 U/L (ref 39–117)
BUN: 20 mg/dL (ref 6–23)
Creatinine, Ser: 1.28 mg/dL — ABNORMAL HIGH (ref 0.4–1.2)
Glucose, Bld: 122 mg/dL — ABNORMAL HIGH (ref 70–99)
Potassium: 3.4 mEq/L — ABNORMAL LOW (ref 3.5–5.1)
Total Protein: 6.8 g/dL (ref 6.0–8.3)

## 2010-11-03 ENCOUNTER — Other Ambulatory Visit (HOSPITAL_COMMUNITY): Payer: Self-pay | Admitting: Oncology

## 2010-11-03 ENCOUNTER — Encounter (HOSPITAL_COMMUNITY): Payer: Medicare Other | Attending: Oncology

## 2010-11-03 DIAGNOSIS — C9111 Chronic lymphocytic leukemia of B-cell type in remission: Secondary | ICD-10-CM | POA: Insufficient documentation

## 2010-11-03 DIAGNOSIS — C9210 Chronic myeloid leukemia, BCR/ABL-positive, not having achieved remission: Secondary | ICD-10-CM

## 2010-11-08 ENCOUNTER — Encounter (HOSPITAL_COMMUNITY): Payer: Medicare Other | Admitting: Oncology

## 2010-11-08 DIAGNOSIS — C9210 Chronic myeloid leukemia, BCR/ABL-positive, not having achieved remission: Secondary | ICD-10-CM

## 2010-11-19 LAB — MISCELLANEOUS TEST

## 2010-11-26 NOTE — Discharge Summary (Signed)
[facility]  Patient:    Ana Nelson, Ana Nelson Visit Number: [id number] MRN: [id number]          Service Type: OBV Location: 3A A313 01 Attending Physician:  Fredirick Maudlin Dictated by:   Kari Baars, M.D. Admit Date:  10/31/2001 Discharge Date: 11/02/2001                             Discharge Summary  PROBLEMS: 1. Gastrointestinal bleeding, probably nonsteroidal anti-inflammatory    drug-induced. 2. Anemia secondary to #1. 3. 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 4. Chronic arthritis. 5. Migraine headaches.  HISTORY:  The patient is a 70 year old who was in her normal state of fairly good health, although she has a leukemia that is being treated with Gleevec and doing very well. She had blood checked the week prior to admission and it showed that she had a good hemoglobin level of greater than 10, then it was checked early on the week of admission, and her hemoglobin had dropped to approximately 7. She said she is not aware of any bleeding, but she is on iron so she cannot tell if her stools change color or not. She has no previous episodes of GI bleeding. She does take Celebrex on an almost daily basis for complaints of arthritis. Her physical exam on admission showed that her stool was heme-positive and dark, her chest was clear, she looked fairly comfortable despite her hemoglobin being down. She did not seem to have any hemodynamic instability. Her chest was fairly clear, heart regular without gallop, her abdomen soft. He hemoglobin level was around 7.  HOSPITAL COURSE:  She was given 2 units of packed red blood cells and underwent a GI consultation and had endoscopy. Her endoscopy did not show a definite bleeding site. It was felt that she probably had a nonsteroidal anti-inflammatory drug-induced GI bleed. She improved, had repeat hemoglobin level checked. This was found to be still somewhat low, and she was given  one more unit of packed red blood cells for a total of three and discharged home.  DISCHARGE INSTRUCTIONS:  She is going to have her hemoglobin level checked again in about three days. She is going to stay off of the nonsteroidal anti-inflammatory drug. She is to restart her iron. Dictated by:   Kari Baars, M.D. Attending Physician:  Fredirick Maudlin DD:  11/02/01 TD:  11/02/01 Job: [id number]

## 2010-11-26 NOTE — Op Note (Signed)
   NAME:  Ana Nelson, Ana Nelson                       ACCOUNT NO.:  [id number]   MEDICAL RECORD NO.:  [id number]                   PATIENT TYPE:  AMB   LOCATION:  DAY                                  FACILITY:  [facility]   PHYSICIAN:  Jerolyn Shin C. Katrinka Blazing, M.D.                DATE OF BIRTH:  05/28/1941   DATE OF PROCEDURE:  05/24/2002  DATE OF DISCHARGE:  05/24/2002                                 OPERATIVE REPORT   PREOPERATIVE DIAGNOSIS:  Chronic myelogenous leukemia.   POSTOPERATIVE DIAGNOSIS:  Chronic myelogenous leukemia.   PROCEDURE:  Port-A-Cath insertion.   SURGEON:  Dirk Dress. Katrinka Blazing, M.D.   DESCRIPTION OF PROCEDURE:  The patient was taken to the operating room and  was given heavy IV sedation and monitored closely by anesthesia.  The left  neck and chest were prepped and draped in a sterile field.  Local  infiltration with 1% Xylocaine was used for anesthesia.  The left subclavian  vein was accessed without difficulty with the patient in Trendelenburg  position.  A guidewire was placed and was followed by fluoroscopy.  Once  this was done, the pocket was developed in the left parasternal area and the  catheter was tunneled.  Using an introducer set, the catheter was positioned  into superior vena cava uneventfully.  It was attached to the ______ chamber  and the system was flushed with heparinized saline.  The catheter was  sutured to the fascia with 3-0 Prolene.  The pocket was closed with 3-0  Biosyn and 4-0 Dexon.  The patient tolerated the procedure well.  Dressing  was placed.  She was transferred to a bed and taken to the post-anesthetic  care unit for followup chest x-ray and transferred to day surgery.                                               Dirk Dress. Katrinka Blazing, M.D.    LCS/MEDQ  D:  06/29/2002  T:  07/01/2002  Job:  [id number]

## 2010-11-26 NOTE — H&P (Signed)
   NAME:  Ana Nelson, Ana Nelson                       ACCOUNT NO.:  [id number]   MEDICAL RECORD NO.:  [id number]                   PATIENT TYPE:  AMB   LOCATION:  DAY                                  FACILITY:  [facility]   PHYSICIAN:  Jerolyn Shin C. Katrinka Blazing, M.D.                DATE OF BIRTH:  1941-01-29   DATE OF ADMISSION:  05/24/2002  DATE OF DISCHARGE:                                HISTORY & PHYSICAL   HISTORY OF PRESENT ILLNESS:  [age]-year-old female with chronic  myelogenous leukemia for one year.  She has been undergoing chemotherapy and  has had difficulty with venous access; she is scheduled for Port-A-Cath  insertion.   PAST HISTORY:  She has hypertension and migraine headaches.   PAST SURGICAL HISTORY:  Surgery includes right nephrectomy in 1980 and had a  small-bowel resection in 1980.   TRANSFUSIONS:  She has a history of multiple blood transfusions.   MEDICATIONS:  1. Gleevec.  2. Cozaar 50 mg every day.  3. Demerol q.4h. p.r.n.  4. Zomig p.r.n. for headache.  5. Lasix p.r.n.   ALLERGIES:  VISTARIL and NUBAIN which cause tongue swelling.   FAMILY HISTORY:  Family history is positive for hypertension,  atherosclerotic heart disease, stroke and depression.   PHYSICAL EXAMINATION:  VITAL SIGNS:  Blood pressure 130/84, pulse 68,  respirations 16.  Weight 159 pounds.  HEENT:  Unremarkable.  NECK:  Neck is supple.  There is no adenopathy.  CHEST:  Chest clear to auscultation.  No rales, rubs, rhonchi or wheezes.  HEART:  Regular rate and rhythm without murmur, gallop or rub.  ABDOMEN:  Abdomen soft and nontender.  No masses.  EXTREMITIES:  No cyanosis, clubbing or edema.  NEUROLOGIC:  No motor, sensory or cerebellar deficit.   IMPRESSION:  1. Chronic myelogenous leukemia.  2.     Hypertension.  3. Migraine headaches.   PLAN:  Port-A-Cath insertion.                                               Dirk Dress. Katrinka Blazing, M.D.    LCS/MEDQ  D:  05/23/2002  T:  05/24/2002  Job:   [id number]

## 2010-11-26 NOTE — Group Therapy Note (Signed)
[facility]  Patient:    JURLINE, FOLGER Visit Number: [id number] MRN: [id number]          Service Type: OBV Location: 3A A313 01 Attending Physician:  Fredirick Maudlin Dictated by:   Kari Baars, M.D. Admit Date:  10/31/2001                               Progress Note  PROBLEMS:  Anemia, chronic myelogenous leukemia.  SUBJECTIVE:  Ms. Cihlar says she is doing okay.  She has no new complaints. She denies any nausea or vomiting.  She has not noticed any blood in her stool but her stool is heme-positive.  PHYSICAL EXAMINATION  LABORATORIES:  Hemoglobin is about 10.5 after 2 units of packed red blood cells.  CHEST:  Clear.  HEART:  Regular.  ABDOMEN:  Soft.  RECTAL:  Stool, as mentioned, is heme-positive.  ASSESSMENT:  She has heme-positive stool.  She has myelogenous leukemia.  She has multiple other medical problems.  PLAN:  Continue treatments.  Continue medications.  She is set for EGD by Dr. Karilyn Cota today.  This may well be an NSAID related problem and if so she will be removed from the NSAID. Dictated by:   Kari Baars, M.D. Attending Physician:  Fredirick Maudlin DD:  11/01/01 TD:  11/02/01 Job: [id number]

## 2010-11-26 NOTE — Consult Note (Signed)
NAME:  Ana Nelson, Ana Nelson                       ACCOUNT NO.:   MEDICAL RECORD NO.:                             PATIENT TYPE:  OUT   LOCATION:  RAD                                  FACILITY:  [facility]   PHYSICIAN:  Lionel December, M.D.                 DATE OF BIRTH:  Sep 14, 1940   DATE OF CONSULTATION:  DATE OF DISCHARGE:                                   CONSULTATION   PRESENTING COMPLAINT:  Anemia and multiple heme-positive stools.   HISTORY OF PRESENT ILLNESS:  The patient is a 70 year old Caucasian female  who is being seen at Dr. Thornton Papas request.  The patient, however, is well  known to me from previous evaluation.  She was last seen while hospitalized  in April 2003.  At that time, she was admitted with profound normocytic  anemia and a heme-positive stool.  She had taken a few doses of Celebrex and  Advil prior to her hospitalization.  Her H&H had dropped to 6.9 and 20 and  she was given 2 or 3 units of PRBC's.  She had esophagogastroduodenoscopy  which showed small sliding hiatal hernia, mild changes of reflux esophagitis  of the EG junction, erosive antral gastritis.  Her H. pylori serology was  checked.  I believe it was negative but I do not have the result in front of  me.  She was maintained on PPI.  Since then, she has had multiple stools and  they have been guaiac positive.  Her hemoglobin did drop once and she had to  be retransfused.  She has been on oral iron therapy but apparently does not  absorb well and she has had parenteral iron, according to the patient.  She  is due for another H&H soon.  Her hemoglobin was 10.4 on January 14, 2002 and  last month it was in the 9 range.  She denies frank melena or rectal  bleeding.  She states at times her stools are dark but it turns the water  color to pink.  She denies abdominal pain, nausea, vomiting, but she has  been experiencing daily heartburn for a month.  She has been hoarse for two  months.  She has an appointment  with an ENT specialist the later part of  this month.  She also complains of dysphagia, which is experienced primarily  with liquids but not to solids.  She has had this for several months.  She  has a good appetite.  She has not lost any weight recently.   MEDICATIONS:  1. Gleevec 400 mg q.d.  2. Elavil 50 mg q.d.  3. Xanax 1 mg q.h.s.  4. Stool softener q.d.  5. Ex-Lax 2-3 every other day.  She has had chronic constipation.   PAST MEDICAL HISTORY:  The patient did have a colonoscopy in September 2002  because of change in her bowel habits and  LUQ abdominal pain.  She had  melanosis coli, small external hemorrhoids; otherwise normal exam.  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  She has history of hypertension, she is not  on any medication now.  Chronic constipation.  History of GI bleed in the  past due to Vioxx.  This was a few years ago.  Her Vioxx was simply  discontinued but she did not have any workup at that time.  She has CML,  which was diagnosed one year ago and she is doing very well with therapy.  She had cytogenetics done by Dr. Mariel Sleet in April 2003 and she only had  15% of the cells with bcr/abl positivity.  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  She also had a small bowel resection  several years ago secondary to an intestinal obstruction due to adhesions.  History of GERD and migraine.   ALLERGIES:  NUBAIN and RESTORIL resulting in tongue swelling.   FAMILY HISTORY:  Significant for various cancers.  Her grandmother and four  aunts have had breast cancer.  One aunt with uterine cancer.  Her son had  surgery for colon carcinoma at age 50 and 10 years later he is doing fine.  She lost one brother from an MI at age 31.   SOCIAL HISTORY:  She is married and has three children.  She has never  smoked cigarettes and does not drink alcohol.   PHYSICAL  EXAMINATION:  GENERAL:  A pleasant, well-developed, well-nourished  Caucasian female who is in no acute distress.  VITAL SIGNS:  She weighs 165 pounds.  She is 5 feet 5 inches tall.  Pulse 84  per minute, blood pressure 120/72.  She is afebrile.  HEENT:  Conjunctiva is pale.  Sclera is non-icteric.  Oropharyngeal mucosa  is normal.  Several teeth are missing.  Remaining are in satisfactory  condition.  NECK:  Without masses or thyromegaly.  CARDIAC:  Regular rhythm.  Normal S1, S2.  No murmur or gallop noted.  LUNGS:  Clear to auscultation.  ABDOMEN:  Full.  No bruits noted.  Palpation reveals a soft abdomen without  tenderness or masses.  Spleen is not palpable anymore.  RECTAL:  Deferred.  EXTREMITIES:  She does not have peripheral edema, clubbing, or paronychia.   ASSESSMENT:  The patient is a 70 year old Caucasian female with a one-year  history of chronic myelogenous leukemia who is in remission with Gleevec who  was noted to have multiple heme-positive stools.  She also has anemia  associated with this.  It previously has been documented to be iron  deficiency.  She had a colonoscopy one year ago without significant findings  and she had an esophagogastroduodenoscopy about five months ago with a  gastritis but no lesion to explain her blood loss.  Since her H&H cannot be  maintained at a reasonable level and stools are repeatedly heme-positive,  further workup is warranted.  Her lower gastrointestinal tract needs to be  reevaluated and, if it is normal, esophagogastroduodenoscopy should also be  performed.  If these studies are negative, would consider a small bowel  study and, if all of these are negative, we will do a GIVEN study.   The patient appears pale to me and she has noted some fatigue, and therefore  we will let her go to the hospital ahead of schedule for CBC  today.   RECOMMENDATIONS:  She will have her CBC today.  We will proceed with a total colonoscopy plus or  minus  esophagogastroduodenoscopy at [facility] in the near future.  I  have reviewed both of the procedures with the patient but she is quite  familiar with and I have answered all of her questions.  She is agreeable.   We will start her on Nexium 40 mg p.o. q.a.m. for gastroesophageal reflux  disease symptoms.  I suspect her hoarseness is also secondary to it and  hopefully she will be feeling better by the time she sees Dr. Pollyann Kennedy or  associates.                                                Lionel December, M.D.    NR/MEDQ  D:  03/27/2002  T:  03/27/2002  Job:  [id number]   cc:   Ladona Horns. Neijstrom, M.D.  [address]  [place]  [place] [address]  Fax: [phone]   Fredirick Maudlin, M.D.

## 2010-11-26 NOTE — Procedures (Signed)
[facility]  Patient:    Ana Nelson, Ana Nelson Visit Number: [id number] MRN: [id number]          Service Type: OUT Location: RAD Attending Physician:  Fredirick Maudlin Dictated by:   Kari Baars, M.D. Admit Date:  08/09/2001                                Stress Test  REASON FOR PROCEDURE:  Chest and jaw pain.  Ms. Puccio has had chest and jaw pain which are generally associated with exertion.  She is undergoing graded exercise testing with Persantine protocol to rule out ischemic cardiac disease.  There are no contraindications to Persantine Cardiolite graded exercise testing.  Persantine was infused per protocol and at six minutes Cardiolite was injected.  Ms. Vandekamp had no symptoms from the Persantine and had normal blood pressures and no EKG changes.  Cardiolite images are pending. Dictated by:   Kari Baars, M.D. Attending Physician:  Fredirick Maudlin DD:  08/09/01 TD:  08/09/01 Job: [id number]

## 2010-12-16 ENCOUNTER — Encounter (HOSPITAL_COMMUNITY): Payer: Medicare Other

## 2011-01-27 ENCOUNTER — Encounter (HOSPITAL_COMMUNITY): Payer: Medicare Other | Attending: Oncology

## 2011-01-27 DIAGNOSIS — C9111 Chronic lymphocytic leukemia of B-cell type in remission: Secondary | ICD-10-CM | POA: Insufficient documentation

## 2011-01-27 DIAGNOSIS — C9210 Chronic myeloid leukemia, BCR/ABL-positive, not having achieved remission: Secondary | ICD-10-CM

## 2011-01-27 LAB — DIFFERENTIAL
Basophils Absolute: 0 10*3/uL (ref 0.0–0.1)
Lymphocytes Relative: 23 % (ref 12–46)
Lymphs Abs: 0.9 10*3/uL (ref 0.7–4.0)
Neutro Abs: 2.5 10*3/uL (ref 1.7–7.7)
Neutrophils Relative %: 65 % (ref 43–77)

## 2011-01-27 LAB — COMPREHENSIVE METABOLIC PANEL
ALT: 17 U/L (ref 0–35)
Chloride: 106 mEq/L (ref 96–112)
GFR calc Af Amer: 40 mL/min — ABNORMAL LOW (ref >60)
GFR calc non Af Amer: 33 mL/min — ABNORMAL LOW (ref >60)
Potassium: 4.1 mEq/L (ref 3.5–5.1)
Total Protein: 6.7 g/dL (ref 6.0–8.3)

## 2011-01-27 LAB — CBC
HCT: 33.6 % — ABNORMAL LOW (ref 36.0–46.0)
Platelets: 112 10*3/uL — ABNORMAL LOW (ref 150–400)
RBC: 3.31 MIL/uL — ABNORMAL LOW (ref 3.87–5.11)
RDW: 15.4 % (ref 11.5–15.5)
WBC: 3.8 10*3/uL — ABNORMAL LOW (ref 4.0–10.5)

## 2011-01-27 NOTE — Progress Notes (Signed)
Labs drawn today for cbc/diff,cmp 

## 2011-02-28 ENCOUNTER — Other Ambulatory Visit (HOSPITAL_COMMUNITY): Payer: Self-pay | Admitting: Oncology

## 2011-02-28 ENCOUNTER — Encounter (HOSPITAL_COMMUNITY): Payer: Medicare Other | Attending: Oncology

## 2011-02-28 DIAGNOSIS — R5381 Other malaise: Secondary | ICD-10-CM | POA: Insufficient documentation

## 2011-02-28 DIAGNOSIS — R531 Weakness: Secondary | ICD-10-CM

## 2011-02-28 LAB — COMPREHENSIVE METABOLIC PANEL
AST: 28 U/L (ref 0–37)
Albumin: 3.8 g/dL (ref 3.5–5.2)
Alkaline Phosphatase: 76 U/L (ref 39–117)
BUN: 20 mg/dL (ref 6–23)
CO2: 25 mEq/L (ref 19–32)
Chloride: 108 mEq/L (ref 96–112)
GFR calc non Af Amer: 25 mL/min — ABNORMAL LOW (ref >60)
Potassium: 4.6 mEq/L (ref 3.5–5.1)
Total Bilirubin: 0.2 mg/dL — ABNORMAL LOW (ref 0.3–1.2)

## 2011-02-28 LAB — DIFFERENTIAL
Basophils Absolute: 0 10*3/uL (ref 0.0–0.1)
Basophils Relative: 1 % (ref 0–1)
Lymphocytes Relative: 28 % (ref 12–46)
Monocytes Relative: 7 % (ref 3–12)
Neutro Abs: 2.7 10*3/uL (ref 1.7–7.7)
Neutrophils Relative %: 62 % (ref 43–77)

## 2011-02-28 LAB — CBC
HCT: 34.5 % — ABNORMAL LOW (ref 36.0–46.0)
Hemoglobin: 11.4 g/dL — ABNORMAL LOW (ref 12.0–15.0)
MCV: 102.4 fL — ABNORMAL HIGH (ref 78.0–100.0)
RBC: 3.37 MIL/uL — ABNORMAL LOW (ref 3.87–5.11)
RDW: 15.5 % (ref 11.5–15.5)
WBC: 4.4 10*3/uL (ref 4.0–10.5)

## 2011-02-28 NOTE — Progress Notes (Signed)
Ana Nelson presented for labwork. Labs per MD order drawn via Peripheral Line 25 gauge needle inserted in rt ac.  Good blood return present. Procedure without incident.  Needle removed intact. Patient tolerated procedure well.

## 2011-03-01 ENCOUNTER — Telehealth (HOSPITAL_COMMUNITY): Payer: Self-pay | Admitting: *Deleted

## 2011-03-01 LAB — IRON AND TIBC
Saturation Ratios: 32 % (ref 20–55)
UIBC: 179 ug/dL

## 2011-03-01 LAB — FERRITIN: Ferritin: 153 ng/mL (ref 10–291)

## 2011-03-01 NOTE — Telephone Encounter (Signed)
Message copied by Dennie Maizes on Tue Mar 01, 2011  2:36 PM ------      Message from: Mariel Sleet, ERIC S      Created: Tue Mar 01, 2011  2:29 PM       Labs stable, needs to follow-up with Befedadu!

## 2011-03-01 NOTE — Telephone Encounter (Signed)
Spoke with pt, states she is still following up with Dr.Befekadu. Also reports she checked with her pharmacist if there was anything she was taking that would effect her creatinine level. Reports she was taking Aleve for back pain and they told her to stop taking it. Also she has already been in contact with Dr.Befekadu's office today. She wants you to know that she walked a mile today and plans on doing that every day that the weather permits.

## 2011-03-10 ENCOUNTER — Encounter (HOSPITAL_COMMUNITY): Payer: Medicare Other

## 2011-03-31 ENCOUNTER — Other Ambulatory Visit (HOSPITAL_COMMUNITY): Payer: Self-pay | Admitting: Pulmonary Disease

## 2011-03-31 ENCOUNTER — Ambulatory Visit (HOSPITAL_COMMUNITY)
Admission: RE | Admit: 2011-03-31 | Discharge: 2011-03-31 | Disposition: A | Discharge status: Home / Self Care | Payer: Medicare Other | Source: Ambulatory Visit | Attending: Pulmonary Disease | Admitting: Pulmonary Disease

## 2011-03-31 DIAGNOSIS — R52 Pain, unspecified: Secondary | ICD-10-CM

## 2011-03-31 DIAGNOSIS — M545 Low back pain, unspecified: Secondary | ICD-10-CM

## 2011-03-31 DIAGNOSIS — M899 Disorder of bone, unspecified: Secondary | ICD-10-CM | POA: Insufficient documentation

## 2011-03-31 DIAGNOSIS — M51379 Other intervertebral disc degeneration, lumbosacral region without mention of lumbar back pain or lower extremity pain: Secondary | ICD-10-CM | POA: Insufficient documentation

## 2011-03-31 DIAGNOSIS — M5137 Other intervertebral disc degeneration, lumbosacral region: Secondary | ICD-10-CM | POA: Insufficient documentation

## 2011-03-31 DIAGNOSIS — R0789 Other chest pain: Secondary | ICD-10-CM | POA: Insufficient documentation

## 2011-03-31 DIAGNOSIS — M79609 Pain in unspecified limb: Secondary | ICD-10-CM | POA: Insufficient documentation

## 2011-03-31 LAB — CBC
HCT: 35.2 — ABNORMAL LOW
Hemoglobin: 11.9 — ABNORMAL LOW
MCHC: 33.9
MCV: 98.3
RBC: 3.58 — ABNORMAL LOW

## 2011-04-01 LAB — CBC
MCHC: 34.7
MCV: 97.2
Platelets: 105 — ABNORMAL LOW
RDW: 14.8
WBC: 4.4

## 2011-04-04 LAB — CBC
HCT: 29.1 — ABNORMAL LOW
MCV: 97.7
RBC: 2.98 — ABNORMAL LOW
WBC: 4.4

## 2011-04-04 LAB — BCR/ABL GENE REARRANGEMENT QNT, PCR

## 2011-04-06 LAB — CBC
HCT: 30.3 — ABNORMAL LOW
MCV: 97.8
Platelets: 103 — ABNORMAL LOW
RDW: 15

## 2011-04-07 LAB — CBC
HCT: 32.8 — ABNORMAL LOW
MCHC: 33.8
Platelets: 105 — ABNORMAL LOW
RDW: 15.1

## 2011-04-08 LAB — DIFFERENTIAL
Basophils Absolute: 0
Lymphocytes Relative: 20
Monocytes Absolute: 0.4
Neutro Abs: 3.5
Neutrophils Relative %: 67

## 2011-04-08 LAB — CBC
Hemoglobin: 10.2 — ABNORMAL LOW
RBC: 3.09 — ABNORMAL LOW
RDW: 14.7

## 2011-04-12 LAB — DIFFERENTIAL
Eosinophils Relative: 4
Lymphocytes Relative: 27
Lymphs Abs: 1.2
Monocytes Absolute: 0.4
Monocytes Relative: 8

## 2011-04-12 LAB — CBC
HCT: 32 — ABNORMAL LOW
Hemoglobin: 10.9 — ABNORMAL LOW
Platelets: 100 — ABNORMAL LOW
RBC: 3.21 — ABNORMAL LOW

## 2011-04-12 LAB — COMPREHENSIVE METABOLIC PANEL
AST: 40 — ABNORMAL HIGH
Albumin: 4.3
Calcium: 9.1
Creatinine, Ser: 1.47 — ABNORMAL HIGH
GFR calc Af Amer: 43 — ABNORMAL LOW

## 2011-04-13 LAB — COMPREHENSIVE METABOLIC PANEL
Albumin: 3.8
BUN: 15
Chloride: 106
Creatinine, Ser: 1.29 — ABNORMAL HIGH
Total Bilirubin: 0.5

## 2011-04-13 LAB — CBC
HCT: 30.1 — ABNORMAL LOW
MCV: 99.2
Platelets: 89 — ABNORMAL LOW
RDW: 15.6 — ABNORMAL HIGH
WBC: 3.8 — ABNORMAL LOW

## 2011-04-13 LAB — DIFFERENTIAL
Basophils Absolute: 0
Lymphocytes Relative: 25
Monocytes Absolute: 0.3
Neutro Abs: 2.3

## 2011-04-14 LAB — CBC
HCT: 32.4 % — ABNORMAL LOW (ref 36.0–46.0)
Hemoglobin: 10.9 g/dL — ABNORMAL LOW (ref 12.0–15.0)
RBC: 3.3 MIL/uL — ABNORMAL LOW (ref 3.87–5.11)
RDW: 15.4 % (ref 11.5–15.5)
WBC: 3.7 10*3/uL — ABNORMAL LOW (ref 4.0–10.5)

## 2011-04-14 LAB — DIFFERENTIAL
Eosinophils Relative: 2 % (ref 0–5)
Lymphocytes Relative: 25 % (ref 12–46)
Lymphs Abs: 0.9 10*3/uL (ref 0.7–4.0)
Monocytes Absolute: 0.3 10*3/uL (ref 0.1–1.0)
Monocytes Relative: 9 % (ref 3–12)

## 2011-04-15 LAB — DIFFERENTIAL
Basophils Absolute: 0
Eosinophils Absolute: 0.2
Eosinophils Relative: 4
Monocytes Absolute: 0.3

## 2011-04-15 LAB — CBC
HCT: 34 — ABNORMAL LOW
MCHC: 34
MCV: 99.6
Platelets: 98 — ABNORMAL LOW
RDW: 15.2

## 2011-04-19 LAB — CBC
HCT: 32.4 — ABNORMAL LOW
Hemoglobin: 10.9 — ABNORMAL LOW
MCHC: 33.6
Platelets: 95 — ABNORMAL LOW
RDW: 15.3
WBC: 3.7 — ABNORMAL LOW

## 2011-04-19 LAB — METHYLMALONIC ACID, SERUM: Methylmalonic Acid, Quantitative: 289 nmol/L (ref 87–318)

## 2011-04-20 LAB — CBC
Hemoglobin: 10.7 — ABNORMAL LOW
RBC: 3.19 — ABNORMAL LOW
RDW: 14.6 — ABNORMAL HIGH

## 2011-04-20 LAB — METHYLMALONIC ACID, SERUM: Methylmalonic Acid, Quantitative: 254 nmol/L (ref 87–318)

## 2011-04-21 ENCOUNTER — Encounter (HOSPITAL_COMMUNITY): Payer: Medicare Other

## 2011-04-21 LAB — VITAMIN B12: Vitamin B-12: 303 (ref 211–911)

## 2011-04-21 LAB — MISCELLANEOUS TEST

## 2011-04-21 LAB — DIFFERENTIAL
Basophils Absolute: 0
Basophils Relative: 1
Eosinophils Absolute: 0.1
Eosinophils Relative: 4
Lymphocytes Relative: 29
Lymphs Abs: 1
Monocytes Absolute: 0.3
Monocytes Relative: 9
Neutro Abs: 2
Neutrophils Relative %: 58

## 2011-04-21 LAB — METHYLMALONIC ACID, SERUM: Methylmalonic Acid, Quantitative: 451 nmol/L — ABNORMAL HIGH (ref 87–318)

## 2011-04-21 LAB — CBC
HCT: 32.9 — ABNORMAL LOW
Platelets: 113 — ABNORMAL LOW
RDW: 15.3 — ABNORMAL HIGH

## 2011-04-22 ENCOUNTER — Encounter (HOSPITAL_COMMUNITY): Payer: Medicare Other | Attending: Oncology

## 2011-04-22 DIAGNOSIS — C9210 Chronic myeloid leukemia, BCR/ABL-positive, not having achieved remission: Secondary | ICD-10-CM

## 2011-04-22 LAB — COMPREHENSIVE METABOLIC PANEL
AST: 26 U/L (ref 0–37)
BUN: 13 mg/dL (ref 6–23)
CO2: 28 mEq/L (ref 19–32)
Calcium: 9.3 mg/dL (ref 8.4–10.5)
Creatinine, Ser: 1.45 mg/dL — ABNORMAL HIGH (ref 0.50–1.10)
GFR calc Af Amer: 41 mL/min — ABNORMAL LOW (ref >90)
GFR calc non Af Amer: 36 mL/min — ABNORMAL LOW (ref >90)
Glucose, Bld: 104 mg/dL — ABNORMAL HIGH (ref 70–99)

## 2011-04-22 LAB — CBC
HCT: 34.3 % — ABNORMAL LOW (ref 36.0–46.0)
MCH: 32.9 pg (ref 26.0–34.0)
MCV: 103.6 fL — ABNORMAL HIGH (ref 78.0–100.0)
Platelets: 97 10*3/uL — ABNORMAL LOW (ref 150–400)
RBC: 3.31 MIL/uL — ABNORMAL LOW (ref 3.87–5.11)

## 2011-04-22 LAB — DIFFERENTIAL
Basophils Absolute: 0 10*3/uL (ref 0.0–0.1)
Lymphocytes Relative: 21 % (ref 12–46)
Neutro Abs: 2.4 10*3/uL (ref 1.7–7.7)
Neutrophils Relative %: 67 % (ref 43–77)

## 2011-04-22 NOTE — Progress Notes (Signed)
Labs drawn today for cbc/diff,cmp,bcr/abl 

## 2011-04-25 LAB — CBC
HCT: 36.2
Hemoglobin: 12.2
RBC: 3.71 — ABNORMAL LOW
WBC: 4

## 2011-04-27 LAB — CBC
HCT: 30.9 — ABNORMAL LOW
Hemoglobin: 11 — ABNORMAL LOW
MCHC: 35.5
RBC: 3.21 — ABNORMAL LOW
RDW: 15.1 — ABNORMAL HIGH

## 2011-04-27 LAB — BCR/ABL GENE REARRANGEMENT QNT, PCR: BCR ABL1 / ABL1 IS: 0 not reported

## 2011-04-28 LAB — DIFFERENTIAL
Basophils Absolute: 0
Basophils Relative: 1
Eosinophils Absolute: 0.2
Monocytes Absolute: 0.4
Neutro Abs: 2.5
Neutrophils Relative %: 65

## 2011-04-28 LAB — CBC
MCHC: 35.1
MCV: 96.8
RDW: 15.2 — ABNORMAL HIGH

## 2011-04-29 ENCOUNTER — Encounter (HOSPITAL_COMMUNITY): Payer: Self-pay

## 2011-05-02 ENCOUNTER — Encounter (HOSPITAL_COMMUNITY): Payer: Self-pay | Admitting: Oncology

## 2011-05-02 ENCOUNTER — Encounter (HOSPITAL_BASED_OUTPATIENT_CLINIC_OR_DEPARTMENT_OTHER): Payer: Medicare Other | Admitting: Oncology

## 2011-05-02 VITALS — BP 124/68 | HR 97 | Temp 98.5°F | Wt 127.4 lb

## 2011-05-02 DIAGNOSIS — N289 Disorder of kidney and ureter, unspecified: Secondary | ICD-10-CM

## 2011-05-02 DIAGNOSIS — C9210 Chronic myeloid leukemia, BCR/ABL-positive, not having achieved remission: Secondary | ICD-10-CM

## 2011-05-02 DIAGNOSIS — I10 Essential (primary) hypertension: Secondary | ICD-10-CM

## 2011-05-02 NOTE — Patient Instructions (Signed)
[facility]  Discharge Instructions  RECOMMENDATIONS MADE BY THE CONSULTANT AND ANY TEST RESULTS WILL BE SENT TO YOUR REFERRING DOCTOR.        SPECIAL INSTRUCTIONS/FOLLOW-UP: Labs in 3 months and 6 months and then see the MD after 6 month labs.   I acknowledge that I have been informed and understand all the instructions given to me and received a copy. I do not have any more questions at this time, but understand that I may call the Specialty Clinic at [facility] at [phone] during business hours should I have any further questions or need assistance in obtaining follow-up care.    __________________________________________  _____________  __________ Signature of Patient or Authorized Representative            Date                   Time    __________________________________________ Nurse's Signature

## 2011-05-02 NOTE — Progress Notes (Signed)
This office note has been dictated.

## 2011-05-23 ENCOUNTER — Other Ambulatory Visit (HOSPITAL_COMMUNITY): Payer: Self-pay | Admitting: Oncology

## 2011-05-23 DIAGNOSIS — Z139 Encounter for screening, unspecified: Secondary | ICD-10-CM

## 2011-05-30 NOTE — Progress Notes (Signed)
CC:   Ana Nelson, M.D. Ana Nelson, M.D. Ana Nelson, M.D.  DIAGNOSES: 1. 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  She still,     however, has mild thrombocytopenia and leukopenia. 2. Renal insufficiency.  Seeing Dr. Kristian Covey and her recent creatinine     was actually somewhat better.  She was taken off her Aleve which     she was taking on a fairly regular basis.  Her creatinine has gone     from 2.00 to 1.45 as of 04/22/2011. 3. gastroesophageal reflux disease. 4. History of migraine headaches. 5. Non-nephrotic proteinuria. 6. Hypertension. 7. History of depression which is essentially resolved. 8. 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 9. History of a pericardial effusion in the past.  Suad is doing so much better.  She is no longer anxious, she states. She is still on her Gleevec 400 mg.  She tolerates it really quite well. She does not miss a dose, she states.  Her Gleevec is still 400 mg a day.  Her counts the other day did show her platelets still to be slightly low at 97,000, hematocrit 34.3%, hemoglobin 10.9 grams, white count 3600 with an unremarkable differential; and once again, her PCR was absolutely negative the other day.  Her sense of well being is excellent.  Review of systems is negative. Her abdomen is still soft, nontender without hepatosplenomegaly whatsoever.  Bowel sounds are normal.  She has no distention.  She has no inguinal nodes.  Heart shows regular rhythm and rate.  She has no peripheral edema.  Her other vital signs show that her weight is 127 pounds and that is stable compared to July as well as up slightly compared to May of last year.  Other vital signs show blood pressure 124/68 right arm in sitting position, pulse right around 88 to 92 and regular, respirations 16  and unlabored.  She is afebrile.  So, we will continue to follow her with blood work every 12 weeks this time.  She has been very, very stable, of course, for quite some time. We will get a PCR analysis in 6 months and I will see her right after that.  If there is a problem with her labs in 3 months, we will see her then too.    ______________________________ Ladona Horns. Mariel Sleet, MD ESN/MEDQ  D:  05/02/2011  T:  05/02/2011  Job:  [id number]

## 2011-06-09 ENCOUNTER — Ambulatory Visit (HOSPITAL_COMMUNITY): Payer: Medicare Other

## 2011-06-27 ENCOUNTER — Encounter (HOSPITAL_COMMUNITY): Payer: Medicare Other | Attending: Oncology

## 2011-06-27 ENCOUNTER — Other Ambulatory Visit (HOSPITAL_COMMUNITY): Payer: Self-pay | Admitting: Oncology

## 2011-06-27 DIAGNOSIS — C9210 Chronic myeloid leukemia, BCR/ABL-positive, not having achieved remission: Secondary | ICD-10-CM | POA: Insufficient documentation

## 2011-06-27 LAB — COMPREHENSIVE METABOLIC PANEL
ALT: 17 U/L (ref 0–35)
AST: 36 U/L (ref 0–37)
Albumin: 4 g/dL (ref 3.5–5.2)
CO2: 27 mEq/L (ref 19–32)
Chloride: 108 mEq/L (ref 96–112)
Creatinine, Ser: 1.69 mg/dL — ABNORMAL HIGH (ref 0.50–1.10)
GFR calc non Af Amer: 30 mL/min — ABNORMAL LOW (ref >90)
Sodium: 141 mEq/L (ref 135–145)
Total Bilirubin: 0.3 mg/dL (ref 0.3–1.2)

## 2011-06-27 LAB — DIFFERENTIAL
Basophils Absolute: 0 10*3/uL (ref 0.0–0.1)
Basophils Relative: 1 % (ref 0–1)
Eosinophils Relative: 3 % (ref 0–5)
Lymphocytes Relative: 28 % (ref 12–46)
Monocytes Absolute: 0.3 10*3/uL (ref 0.1–1.0)
Monocytes Relative: 10 % (ref 3–12)
Neutro Abs: 1.9 10*3/uL (ref 1.7–7.7)

## 2011-06-27 LAB — CBC
Hemoglobin: 11.5 g/dL — ABNORMAL LOW (ref 12.0–15.0)
MCH: 33 pg (ref 26.0–34.0)
MCHC: 32.8 g/dL (ref 30.0–36.0)
Platelets: 103 10*3/uL — ABNORMAL LOW (ref 150–400)

## 2011-06-27 NOTE — Progress Notes (Signed)
Labs drawn today for cbc/diff.cmp 

## 2011-08-02 ENCOUNTER — Other Ambulatory Visit (HOSPITAL_COMMUNITY): Payer: Medicare Other

## 2011-09-01 ENCOUNTER — Ambulatory Visit (HOSPITAL_COMMUNITY)
Admission: RE | Admit: 2011-09-01 | Discharge: 2011-09-01 | Disposition: A | Discharge status: Home / Self Care | Payer: Medicare Other | Source: Ambulatory Visit | Attending: Oncology | Admitting: Oncology

## 2011-09-01 DIAGNOSIS — Z139 Encounter for screening, unspecified: Secondary | ICD-10-CM

## 2011-09-01 DIAGNOSIS — Z1231 Encounter for screening mammogram for malignant neoplasm of breast: Secondary | ICD-10-CM | POA: Insufficient documentation

## 2011-09-06 ENCOUNTER — Other Ambulatory Visit (HOSPITAL_COMMUNITY): Payer: Self-pay | Admitting: Oncology

## 2011-10-27 ENCOUNTER — Encounter (HOSPITAL_COMMUNITY): Payer: Medicare Other | Attending: Oncology

## 2011-10-27 DIAGNOSIS — D696 Thrombocytopenia, unspecified: Secondary | ICD-10-CM | POA: Insufficient documentation

## 2011-10-27 DIAGNOSIS — G43909 Migraine, unspecified, not intractable, without status migrainosus: Secondary | ICD-10-CM | POA: Insufficient documentation

## 2011-10-27 DIAGNOSIS — N289 Disorder of kidney and ureter, unspecified: Secondary | ICD-10-CM | POA: Insufficient documentation

## 2011-10-27 DIAGNOSIS — F329 Major depressive disorder, single episode, unspecified: Secondary | ICD-10-CM | POA: Insufficient documentation

## 2011-10-27 DIAGNOSIS — C9210 Chronic myeloid leukemia, BCR/ABL-positive, not having achieved remission: Secondary | ICD-10-CM | POA: Insufficient documentation

## 2011-10-27 DIAGNOSIS — D649 Anemia, unspecified: Secondary | ICD-10-CM | POA: Insufficient documentation

## 2011-10-27 DIAGNOSIS — F3289 Other specified depressive episodes: Secondary | ICD-10-CM | POA: Insufficient documentation

## 2011-10-27 DIAGNOSIS — D72819 Decreased white blood cell count, unspecified: Secondary | ICD-10-CM | POA: Insufficient documentation

## 2011-10-27 LAB — CBC
Hemoglobin: 10.7 g/dL — ABNORMAL LOW (ref 12.0–15.0)
Platelets: 87 10*3/uL — ABNORMAL LOW (ref 150–400)
RBC: 3.22 MIL/uL — ABNORMAL LOW (ref 3.87–5.11)
WBC: 3.3 10*3/uL — ABNORMAL LOW (ref 4.0–10.5)

## 2011-10-27 LAB — DIFFERENTIAL
Lymphocytes Relative: 26 % (ref 12–46)
Lymphs Abs: 0.9 10*3/uL (ref 0.7–4.0)
Monocytes Relative: 8 % (ref 3–12)
Neutrophils Relative %: 63 % (ref 43–77)

## 2011-10-27 NOTE — Progress Notes (Signed)
Labs today

## 2011-10-31 ENCOUNTER — Other Ambulatory Visit (HOSPITAL_COMMUNITY): Payer: Medicare Other

## 2011-10-31 LAB — BCR/ABL GENE REARRANGEMENT QNT, PCR: BCR ABL1 / ABL1 IS: 0 not reported

## 2011-10-31 LAB — P190 BCR-ABL 1: P190 BCR ABL1: NOT DETECTED

## 2011-10-31 LAB — P210 BCR-ABL 1: P210 BCR ABL1: NOT DETECTED

## 2011-11-01 ENCOUNTER — Encounter (HOSPITAL_BASED_OUTPATIENT_CLINIC_OR_DEPARTMENT_OTHER): Payer: Medicare Other | Admitting: Oncology

## 2011-11-01 ENCOUNTER — Encounter (HOSPITAL_COMMUNITY): Payer: Self-pay | Admitting: Oncology

## 2011-11-01 VITALS — BP 95/55 | HR 62 | Temp 98.4°F | Ht 62.0 in | Wt 127.6 lb

## 2011-11-01 DIAGNOSIS — N289 Disorder of kidney and ureter, unspecified: Secondary | ICD-10-CM

## 2011-11-01 DIAGNOSIS — C9210 Chronic myeloid leukemia, BCR/ABL-positive, not having achieved remission: Secondary | ICD-10-CM

## 2011-11-01 NOTE — Patient Instructions (Signed)
Ana Nelson  [id number] Jan 07, 1941 Dr. Glenford Peers   [facility]  Discharge Instructions  RECOMMENDATIONS MADE BY THE CONSULTANT AND ANY TEST RESULTS WILL BE SENT TO YOUR REFERRING DOCTOR.   EXAM FINDINGS BY MD TODAY AND SIGNS AND SYMPTOMS TO REPORT TO CLINIC OR PRIMARY MD: Your PRC/ABL was normal.  No changes in therapy.  MEDICATIONS PRESCRIBED: none   INSTRUCTIONS GIVEN AND DISCUSSED: Other :  Report fevers, chills, night sweats, recurring infections, etc.  SPECIAL INSTRUCTIONS/FOLLOW-UP: Lab work Needed every 12 weeks  and Return to Clinic in 6 months to see MD.   I acknowledge that I have been informed and understand all the instructions given to me and received a copy. I do not have any more questions at this time, but understand that I may call the Specialty Clinic at [facility] at [phone] during business hours should I have any further questions or need assistance in obtaining follow-up care.    __________________________________________  _____________  __________ Signature of Patient or Authorized Representative            Date                   Time    __________________________________________ Nurse's Signature

## 2011-11-01 NOTE — Progress Notes (Signed)
Problem #1 CML Philadelphia chromosome positive presenting here in September 2000 still in a cytogenetic complete remission. She does have mild anemia leukopenia and thrombocytopenia however.  Problem #2 renal sufficiency seeing Dr. Kristian Covey  Problem #3 migraine headaches  Problem #4 history depression much improved  Chele's PCR analysis is 0.000. Her counts are stable renal function is stable. The only time that she does not take her Gleevec is which has a migraine headache and is so nauseated she can eat. That occurs about once a month or every other month.  Her physical exam shows no adenopathy stable vital signs the normal lung exam heart exam is also normal. She has no hepatosplenomegaly quite bowel sounds today and no edema  We'll see her back in 6 months with blood tests in 3 months and 6 months.

## 2011-11-02 ENCOUNTER — Ambulatory Visit (HOSPITAL_COMMUNITY): Payer: Medicare Other | Admitting: Oncology

## 2012-01-24 ENCOUNTER — Encounter (HOSPITAL_COMMUNITY): Payer: Medicare Other | Attending: Oncology

## 2012-01-24 DIAGNOSIS — C9210 Chronic myeloid leukemia, BCR/ABL-positive, not having achieved remission: Secondary | ICD-10-CM

## 2012-01-24 LAB — DIFFERENTIAL
Basophils Relative: 0 % (ref 0–1)
Eosinophils Absolute: 0.1 10*3/uL (ref 0.0–0.7)
Eosinophils Relative: 3 % (ref 0–5)
Monocytes Absolute: 0.3 10*3/uL (ref 0.1–1.0)
Monocytes Relative: 9 % (ref 3–12)

## 2012-01-24 LAB — COMPREHENSIVE METABOLIC PANEL
Albumin: 3.8 g/dL (ref 3.5–5.2)
BUN: 13 mg/dL (ref 6–23)
Calcium: 9 mg/dL (ref 8.4–10.5)
Creatinine, Ser: 1.43 mg/dL — ABNORMAL HIGH (ref 0.50–1.10)
Total Bilirubin: 0.2 mg/dL — ABNORMAL LOW (ref 0.3–1.2)
Total Protein: 6.1 g/dL (ref 6.0–8.3)

## 2012-01-24 LAB — CBC
HCT: 32.8 % — ABNORMAL LOW (ref 36.0–46.0)
Hemoglobin: 10.5 g/dL — ABNORMAL LOW (ref 12.0–15.0)
MCH: 32.9 pg (ref 26.0–34.0)
MCHC: 32 g/dL (ref 30.0–36.0)
RDW: 15.7 % — ABNORMAL HIGH (ref 11.5–15.5)

## 2012-01-24 NOTE — Progress Notes (Signed)
Labs drawn today for cbc/diff,cmp 

## 2012-04-10 ENCOUNTER — Telehealth (HOSPITAL_COMMUNITY): Payer: Self-pay | Admitting: *Deleted

## 2012-04-10 NOTE — Telephone Encounter (Signed)
Will defer to Dr. Mariel Sleet

## 2012-04-17 ENCOUNTER — Encounter (HOSPITAL_COMMUNITY): Payer: Medicare Other | Attending: Oncology

## 2012-04-17 ENCOUNTER — Telehealth (HOSPITAL_COMMUNITY): Payer: Self-pay

## 2012-04-17 DIAGNOSIS — C9210 Chronic myeloid leukemia, BCR/ABL-positive, not having achieved remission: Secondary | ICD-10-CM

## 2012-04-17 LAB — CBC
HCT: 34.4 % — ABNORMAL LOW (ref 36.0–46.0)
MCHC: 32.3 g/dL (ref 30.0–36.0)
RDW: 15.6 % — ABNORMAL HIGH (ref 11.5–15.5)

## 2012-04-17 LAB — COMPREHENSIVE METABOLIC PANEL
Alkaline Phosphatase: 90 U/L (ref 39–117)
BUN: 16 mg/dL (ref 6–23)
CO2: 25 mEq/L (ref 19–32)
Chloride: 108 mEq/L (ref 96–112)
Creatinine, Ser: 1.56 mg/dL — ABNORMAL HIGH (ref 0.50–1.10)
GFR calc non Af Amer: 32 mL/min — ABNORMAL LOW (ref >90)
Glucose, Bld: 113 mg/dL — ABNORMAL HIGH (ref 70–99)
Potassium: 4 mEq/L (ref 3.5–5.1)
Total Bilirubin: 0.2 mg/dL — ABNORMAL LOW (ref 0.3–1.2)

## 2012-04-17 LAB — DIFFERENTIAL
Lymphocytes Relative: 20 % (ref 12–46)
Lymphs Abs: 0.8 10*3/uL (ref 0.7–4.0)
Monocytes Absolute: 0.3 10*3/uL (ref 0.1–1.0)
Monocytes Relative: 8 % (ref 3–12)
Neutro Abs: 2.6 10*3/uL (ref 1.7–7.7)

## 2012-04-17 NOTE — Progress Notes (Signed)
Labs drawn today for cbc/diff,cmp,BCR/ABL 

## 2012-04-17 NOTE — Telephone Encounter (Signed)
Per Dr. Lytle Butte - ok for patient to take glucosamine if she desires.  Patient notified.

## 2012-04-21 LAB — P190 BCR-ABL 1: P190 BCR ABL1: NOT DETECTED

## 2012-04-21 LAB — P210 BCR-ABL 1: P210 BCR ABL1: NOT DETECTED

## 2012-04-21 LAB — BCR/ABL GENE REARRANGEMENT QNT, PCR
BCR ABL1 / ABL1 IS: 0 not reported
BCR ABL1 / ABL1: 0 not reported
BCR/ABL Source: 0
Prior result - BCRQ: 0

## 2012-04-23 ENCOUNTER — Encounter (HOSPITAL_COMMUNITY): Payer: Medicare Other | Admitting: Oncology

## 2012-04-23 VITALS — BP 139/65 | HR 67 | Temp 98.0°F | Resp 20 | Wt 126.0 lb

## 2012-04-24 NOTE — Progress Notes (Signed)
Pt left without being seen.

## 2012-05-04 ENCOUNTER — Other Ambulatory Visit (HOSPITAL_COMMUNITY): Payer: Self-pay | Admitting: Oncology

## 2012-05-04 ENCOUNTER — Encounter (HOSPITAL_COMMUNITY): Payer: Self-pay | Admitting: Oncology

## 2012-05-04 DIAGNOSIS — C9210 Chronic myeloid leukemia, BCR/ABL-positive, not having achieved remission: Secondary | ICD-10-CM

## 2012-05-04 HISTORY — DX: Chronic myeloid leukemia, BCR/ABL-positive, not having achieved remission: C92.10

## 2012-05-04 MED ORDER — IMATINIB MESYLATE 400 MG PO TABS: 400.0000 mg | ORAL_TABLET | Freq: Every day | ORAL | Status: DC

## 2012-05-08 ENCOUNTER — Other Ambulatory Visit (HOSPITAL_COMMUNITY): Payer: Self-pay | Admitting: Pulmonary Disease

## 2012-05-08 ENCOUNTER — Ambulatory Visit (HOSPITAL_COMMUNITY)
Admission: RE | Admit: 2012-05-08 | Discharge: 2012-05-08 | Disposition: A | Discharge status: Home / Self Care | Payer: Medicare Other | Source: Ambulatory Visit | Attending: Pulmonary Disease | Admitting: Pulmonary Disease

## 2012-05-08 DIAGNOSIS — R51 Headache: Secondary | ICD-10-CM | POA: Insufficient documentation

## 2012-05-08 DIAGNOSIS — W19XXXA Unspecified fall, initial encounter: Secondary | ICD-10-CM

## 2012-05-08 DIAGNOSIS — R079 Chest pain, unspecified: Secondary | ICD-10-CM | POA: Insufficient documentation

## 2012-05-08 DIAGNOSIS — S0083XA Contusion of other part of head, initial encounter: Secondary | ICD-10-CM | POA: Insufficient documentation

## 2012-05-08 DIAGNOSIS — S0003XA Contusion of scalp, initial encounter: Secondary | ICD-10-CM | POA: Insufficient documentation

## 2012-06-18 ENCOUNTER — Ambulatory Visit (HOSPITAL_COMMUNITY): Payer: PRIVATE HEALTH INSURANCE | Admitting: Oncology

## 2012-07-06 ENCOUNTER — Other Ambulatory Visit (HOSPITAL_COMMUNITY): Payer: Self-pay | Admitting: *Deleted

## 2012-07-06 DIAGNOSIS — C9210 Chronic myeloid leukemia, BCR/ABL-positive, not having achieved remission: Secondary | ICD-10-CM

## 2012-07-06 NOTE — Progress Notes (Signed)
Pt called to c/o weight loss from 126 to 114. She has nausea with just the thought of food. Request lab work and Dr.'s visit moved up. Appts made and lab orders obtained.

## 2012-07-09 ENCOUNTER — Other Ambulatory Visit (HOSPITAL_COMMUNITY): Payer: PRIVATE HEALTH INSURANCE

## 2012-07-16 ENCOUNTER — Encounter (HOSPITAL_COMMUNITY): Payer: PRIVATE HEALTH INSURANCE | Attending: Oncology

## 2012-07-16 DIAGNOSIS — C9210 Chronic myeloid leukemia, BCR/ABL-positive, not having achieved remission: Secondary | ICD-10-CM

## 2012-07-16 LAB — CBC WITH DIFFERENTIAL/PLATELET
Basophils Absolute: 0 10*3/uL (ref 0.0–0.1)
Basophils Relative: 1 % (ref 0–1)
HCT: 36.5 % (ref 36.0–46.0)
MCHC: 32.9 g/dL (ref 30.0–36.0)
Monocytes Absolute: 0.3 10*3/uL (ref 0.1–1.0)
Neutro Abs: 2.3 10*3/uL (ref 1.7–7.7)
Platelets: 108 10*3/uL — ABNORMAL LOW (ref 150–400)
RDW: 15.1 % (ref 11.5–15.5)

## 2012-07-16 LAB — COMPREHENSIVE METABOLIC PANEL
AST: 30 U/L (ref 0–37)
Albumin: 4.1 g/dL (ref 3.5–5.2)
Calcium: 9.4 mg/dL (ref 8.4–10.5)
Chloride: 108 mEq/L (ref 96–112)
Creatinine, Ser: 1.62 mg/dL — ABNORMAL HIGH (ref 0.50–1.10)
Total Bilirubin: 0.2 mg/dL — ABNORMAL LOW (ref 0.3–1.2)

## 2012-07-16 NOTE — Progress Notes (Signed)
Labs drawn today for cmp,cbc/diff,BCR/ABL

## 2012-07-20 ENCOUNTER — Encounter (HOSPITAL_COMMUNITY): Payer: Self-pay | Admitting: Oncology

## 2012-07-20 ENCOUNTER — Encounter (HOSPITAL_BASED_OUTPATIENT_CLINIC_OR_DEPARTMENT_OTHER): Payer: PRIVATE HEALTH INSURANCE | Admitting: Oncology

## 2012-07-20 VITALS — BP 132/67 | HR 74 | Temp 97.9°F | Resp 18 | Wt 119.3 lb

## 2012-07-20 DIAGNOSIS — C9210 Chronic myeloid leukemia, BCR/ABL-positive, not having achieved remission: Secondary | ICD-10-CM

## 2012-07-20 DIAGNOSIS — F341 Dysthymic disorder: Secondary | ICD-10-CM

## 2012-07-20 DIAGNOSIS — D696 Thrombocytopenia, unspecified: Secondary | ICD-10-CM

## 2012-07-20 DIAGNOSIS — N289 Disorder of kidney and ureter, unspecified: Secondary | ICD-10-CM

## 2012-07-20 NOTE — Progress Notes (Signed)
Problem number 1 CML Philadelphia chromosome positive, presenting here in September 2000 still in a complete cytogenetic remission on Gleevec. Problem #2 renal sufficiency which is getting slightly worse Problem #3 migraine headaches much improved Problem #4 depression and anxiety intermittently. Her labs remain excellentExcept for mild thrombocytopenia. Her most recent hemoglobin is 12.0 g. White count 4100 with an unremarkable differential. Unfortunately her creatinine is 1.62 now.  She feels mildly weak and tired only on review of systems oncologically. Vital signs are stable. She looks great. Lungs are clear. Breast exam is negative for masses. She has no obvious hepatosplenomegaly. Heart shows a regular rhythm and rate without murmur rub or gallop. She has no peripheral edema. Her skin exam does reveal scratches over her chest, breast, abdomen and chest wall. 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 Nevertheless her PCR is pending but we will continue to see her in 3 months and 6 months for labs. She has done extremely well overall.

## 2012-07-20 NOTE — Patient Instructions (Addendum)
.  [facility] Discharge Instructions  RECOMMENDATIONS MADE BY THE CONSULTANT AND ANY TEST RESULTS WILL BE SENT TO YOUR REFERRING PHYSICIAN.  EXAM FINDINGS BY THE PHYSICIAN TODAY AND SIGNS OR SYMPTOMS TO REPORT TO CLINIC OR PRIMARY PHYSICIAN: exam good.   MEDICATIONS PRESCRIBED:  Continue same  INSTRUCTIONS GIVEN AND DISCUSSED: Fatigue can be due to your kidney function, gleevec or combination of health problems  SPECIAL INSTRUCTIONS/FOLLOW-UP: Every 3 month labs See [place] back in 6 months  Thank you for choosing [facility] to provide your oncology and hematology care.  To afford each patient quality time with our providers, please arrive at least 15 minutes before your scheduled appointment time.  With your help, our goal is to use those 15 minutes to complete the necessary work-up to ensure our physicians have the information they need to help with your evaluation and healthcare recommendations.    Effective January 1st, 2014, we ask that you re-schedule your appointment with our physicians should you arrive 10 or more minutes late for your appointment.  We strive to give you quality time with our providers, and arriving late affects you and other patients whose appointments are after yours.    Again, thank you for choosing [facility].  Our hope is that these requests will decrease the amount of time that you wait before being seen by our physicians.       _____________________________________________________________  Should you have questions after your visit to [facility], please contact our office at [phone] between the hours of 8:30 a.m. and 5:00 p.m.  Voicemails left after 4:30 p.m. will not be returned until the following business day.  For prescription refill requests, have your pharmacy contact our office with your prescription refill request.

## 2012-07-24 ENCOUNTER — Ambulatory Visit (HOSPITAL_COMMUNITY): Payer: PRIVATE HEALTH INSURANCE | Admitting: Oncology

## 2012-08-24 ENCOUNTER — Ambulatory Visit (HOSPITAL_COMMUNITY)
Admission: RE | Admit: 2012-08-24 | Discharge: 2012-08-24 | Disposition: A | Discharge status: Home / Self Care | Payer: Medicare Other | Source: Ambulatory Visit | Attending: Pulmonary Disease | Admitting: Pulmonary Disease

## 2012-08-24 ENCOUNTER — Other Ambulatory Visit (HOSPITAL_COMMUNITY): Payer: Self-pay | Admitting: Pulmonary Disease

## 2012-08-24 DIAGNOSIS — R410 Disorientation, unspecified: Secondary | ICD-10-CM

## 2012-08-24 DIAGNOSIS — R4182 Altered mental status, unspecified: Secondary | ICD-10-CM | POA: Insufficient documentation

## 2012-08-24 DIAGNOSIS — R2681 Unsteadiness on feet: Secondary | ICD-10-CM

## 2012-08-24 DIAGNOSIS — R42 Dizziness and giddiness: Secondary | ICD-10-CM | POA: Insufficient documentation

## 2012-08-24 DIAGNOSIS — R269 Unspecified abnormalities of gait and mobility: Secondary | ICD-10-CM | POA: Insufficient documentation

## 2012-08-29 ENCOUNTER — Ambulatory Visit (HOSPITAL_COMMUNITY)
Admission: RE | Admit: 2012-08-29 | Discharge: 2012-08-29 | Disposition: A | Discharge status: Home / Self Care | Payer: Medicare Other | Source: Ambulatory Visit | Attending: Pulmonary Disease | Admitting: Pulmonary Disease

## 2012-08-29 ENCOUNTER — Other Ambulatory Visit (HOSPITAL_COMMUNITY): Payer: Self-pay | Admitting: Pulmonary Disease

## 2012-08-29 DIAGNOSIS — Q762 Congenital spondylolisthesis: Secondary | ICD-10-CM | POA: Insufficient documentation

## 2012-08-29 DIAGNOSIS — R209 Unspecified disturbances of skin sensation: Secondary | ICD-10-CM | POA: Insufficient documentation

## 2012-08-29 DIAGNOSIS — R200 Anesthesia of skin: Secondary | ICD-10-CM

## 2012-08-29 DIAGNOSIS — M5030 Other cervical disc degeneration, unspecified cervical region: Secondary | ICD-10-CM | POA: Insufficient documentation

## 2012-08-29 DIAGNOSIS — M542 Cervicalgia: Secondary | ICD-10-CM | POA: Insufficient documentation

## 2012-09-01 ENCOUNTER — Encounter (HOSPITAL_COMMUNITY): Payer: Self-pay | Admitting: *Deleted

## 2012-09-01 ENCOUNTER — Emergency Department (HOSPITAL_COMMUNITY)
Admission: EM | Admit: 2012-09-01 | Discharge: 2012-09-01 | Disposition: A | Discharge status: Home / Self Care | Payer: Medicare Other | Attending: Emergency Medicine | Admitting: Emergency Medicine

## 2012-09-01 DIAGNOSIS — Z8679 Personal history of other diseases of the circulatory system: Secondary | ICD-10-CM | POA: Insufficient documentation

## 2012-09-01 DIAGNOSIS — C9210 Chronic myeloid leukemia, BCR/ABL-positive, not having achieved remission: Secondary | ICD-10-CM | POA: Insufficient documentation

## 2012-09-01 DIAGNOSIS — M6281 Muscle weakness (generalized): Secondary | ICD-10-CM | POA: Insufficient documentation

## 2012-09-01 DIAGNOSIS — Z8619 Personal history of other infectious and parasitic diseases: Secondary | ICD-10-CM | POA: Insufficient documentation

## 2012-09-01 DIAGNOSIS — Z79899 Other long term (current) drug therapy: Secondary | ICD-10-CM | POA: Insufficient documentation

## 2012-09-01 DIAGNOSIS — Z87828 Personal history of other (healed) physical injury and trauma: Secondary | ICD-10-CM | POA: Insufficient documentation

## 2012-09-01 DIAGNOSIS — IMO0002 Reserved for concepts with insufficient information to code with codable children: Secondary | ICD-10-CM | POA: Insufficient documentation

## 2012-09-01 DIAGNOSIS — M2559 Pain in other specified joint: Secondary | ICD-10-CM | POA: Insufficient documentation

## 2012-09-01 DIAGNOSIS — M1990 Unspecified osteoarthritis, unspecified site: Secondary | ICD-10-CM | POA: Insufficient documentation

## 2012-09-01 DIAGNOSIS — M792 Neuralgia and neuritis, unspecified: Secondary | ICD-10-CM

## 2012-09-01 DIAGNOSIS — Z8669 Personal history of other diseases of the nervous system and sense organs: Secondary | ICD-10-CM | POA: Insufficient documentation

## 2012-09-01 DIAGNOSIS — R209 Unspecified disturbances of skin sensation: Secondary | ICD-10-CM | POA: Insufficient documentation

## 2012-09-01 LAB — CBC WITH DIFFERENTIAL/PLATELET
Eosinophils Absolute: 0.1 10*3/uL (ref 0.0–0.7)
Eosinophils Relative: 3 % (ref 0–5)
Hemoglobin: 10.1 g/dL — ABNORMAL LOW (ref 12.0–15.0)
Lymphs Abs: 0.6 10*3/uL — ABNORMAL LOW (ref 0.7–4.0)
MCH: 34.4 pg — ABNORMAL HIGH (ref 26.0–34.0)
MCV: 106.5 fL — ABNORMAL HIGH (ref 78.0–100.0)
Monocytes Absolute: 0.4 10*3/uL (ref 0.1–1.0)
Monocytes Relative: 12 % (ref 3–12)
RBC: 2.94 MIL/uL — ABNORMAL LOW (ref 3.87–5.11)

## 2012-09-01 LAB — BASIC METABOLIC PANEL
BUN: 9 mg/dL (ref 6–23)
Calcium: 9.7 mg/dL (ref 8.4–10.5)
GFR calc non Af Amer: 42 mL/min — ABNORMAL LOW (ref >90)
Glucose, Bld: 105 mg/dL — ABNORMAL HIGH (ref 70–99)

## 2012-09-01 MED ORDER — HYDROMORPHONE HCL PF 1 MG/ML IJ SOLN
0.5000 mg | Freq: Once | INTRAMUSCULAR | Status: AC
  Administered 2012-09-01: 0.5 mg via INTRAVENOUS
  Filled 2012-09-01: qty 1

## 2012-09-01 MED ORDER — HYDROMORPHONE HCL 4 MG PO TABS: 4.0000 mg | ORAL_TABLET | Freq: Four times a day (QID) | ORAL | Status: DC | PRN

## 2012-09-01 MED ORDER — ONDANSETRON HCL 4 MG/2ML IJ SOLN
4.0000 mg | Freq: Once | INTRAMUSCULAR | Status: AC
  Administered 2012-09-01: 4 mg via INTRAVENOUS
  Filled 2012-09-01: qty 2

## 2012-09-01 NOTE — ED Provider Notes (Signed)
History    This chart was scribed for Benny Lennert, MD by Melba Coon, ED Scribe. The patient was seen in room APA17/APA17 and the patient's care was started at 4:09PM.    CSN: [id number]  Arrival date & time 09/01/12  1550   First MD Initiated Contact with Patient 09/01/12 1605      Chief Complaint  Patient presents with  . Arm Pain  . Numbness    (Consider location/radiation/quality/duration/timing/severity/associated sxs/prior treatment) Patient is a 72 y.o. female presenting with extremity pain. The history is provided by the patient. No language interpreter was used.  Extremity Pain This is a chronic problem. The current episode started more than 1 week ago (3 weeks ago). The problem has been gradually worsening. Nothing aggravates the symptoms. Nothing relieves the symptoms. Treatments tried: oxycodone and neurontin. The treatment provided no relief.   Ana Nelson is a 72 y.o. female who presents to the Emergency Department complaining of constant, moderate to severe left arm pain with associated left arm numbness with a gradual onset 3 weeks ago. Pt reports that the left arm pain and numbness has been constantly present since 2 months ago but has worsened since onset when she developed shingles on her left arm. Oxycodone 4 times a day and neurontin is not alleviating the pain. Movement and ROM of the left arm does not aggravate the pain. She reports arthritic pain in her left neck. No other pertinent medical symptoms.  PCP: Dr Juanetta Gosling  Past Medical History  Diagnosis Date  . Osteoarthritis   . CML (chronic myelocytic leukemia)   . History of migraine headaches   . Right knee pain   . Injury of left shoulder   . Blindness of right eye   . Drooping eyelid     left eye  . 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 Bowel blockage surgery    . Right kidney removed  1980  . Bone marrow bx & asp    . Rt.  cataract surgery      Family History  Problem Relation Age of Onset  . Cancer Maternal Uncle     bone  . Cancer Paternal Aunt     uterine  . Cancer Maternal Grandmother     breast  . Cancer Maternal Grandfather     throat  . Cancer Paternal Grandmother     leukemia  . Cancer Son     colon    History  Substance Use Topics  . Smoking status: Never Smoker   . Smokeless tobacco: Never Used  . Alcohol Use: No    OB History   Grav Para Term Preterm Abortions TAB SAB Ect Mult Living                  Review of Systems  Constitutional: Negative for fever.  Musculoskeletal: Positive for arthralgias (left arm and left neck) and extremity weakness.  Neurological: Positive for numbness (left arm).  All other systems reviewed and are negative.   10 Systems reviewed and all are negative for acute change except as noted in the HPI.   Allergies  Nubain; Perphenazine; and Vistaril  Home Medications   Current Outpatient Rx  Name  Route  Sig  Dispense  Refill  . ALPRAZolam (XANAX) 1 MG tablet   Oral   Take 1 mg by mouth at bedtime as needed.           Marland Kitchen  baclofen (LIORESAL) 10 MG tablet   Oral   Take 10 mg by mouth as needed.          . butorphanol (STADOL) 10 MG/ML nasal spray   Nasal   Place 1 spray into the nose as needed.           . calcium carbonate (OS-CAL) 600 MG TABS   Oral   Take 600 mg by mouth daily.         Tery Sanfilippo Calcium (STOOL SOFTENER PO)   Oral   Take by mouth as needed.           . ergocalciferol (VITAMIN D2) 50000 UNITS capsule   Oral   Take 50,000 Units by mouth once a week.         Marland Kitchen glucosamine-chondroitin 500-400 MG tablet   Oral   Take 1 tablet by mouth 3 (three) times daily.         Marland Kitchen imatinib (GLEEVEC) 400 MG tablet   Oral   Take 1 tablet (400 mg total) by mouth daily. Take with meals and large glass of water.Caution:Chemotherapy.   30 tablet   5   . MAXALT-MLT 10 MG disintegrating tablet   Oral   Take 10 mg  by mouth as needed.          . ondansetron (ZOFRAN-ODT) 4 MG disintegrating tablet      4 mg every 8 (eight) hours as needed.          . potassium chloride (K-DUR) 10 MEQ tablet   Oral   Take 10 mEq by mouth daily.           . Sertraline HCl (ZOLOFT PO)   Oral   Take 150 mg by mouth daily.           Marland Kitchen topiramate (TOPAMAX) 25 MG tablet   Oral   Take 25 mg by mouth 2 (two) times daily. 2 pills bid          . zolpidem (AMBIEN) 10 MG tablet   Oral   Take 10 mg by mouth at bedtime as needed.             BP 140/74  Pulse 83  Temp(Src) 99.2 F (37.3 C)  Resp 20  SpO2 99%  Physical Exam  Nursing note and vitals reviewed. Constitutional: She is oriented to person, place, and time.  Cachetic, very thin.  HENT:  Head: Normocephalic.  Eyes: Conjunctivae are normal.  Neck: No tracheal deviation present.  Cardiovascular:  No murmur heard. Musculoskeletal: Normal range of motion.  Neurological: She is oriented to person, place, and time.  Skin: Skin is warm. Rash noted.  Peeling, mild, papular rash to the left arm c/w history of shingles.  Psychiatric: She has a normal mood and affect.    ED Course  Procedures (including critical care time)  DIAGNOSTIC STUDIES: Oxygen Saturation is 99% on room air, normal by my interpretation.     COORDINATION OF CARE:  4:12PM - dilaudid, Zofran, CBC with differential, and BMP will be ordered for Ana Nelson.  Labs Reviewed - No data to display No results found.   No diagnosis found.    MDM    The chart was scribed for me under my direct supervision.  I personally performed the history, physical, and medical decision making and all procedures in the evaluation of this patient.Benny Lennert, MD 09/01/12 [phone]

## 2012-09-01 NOTE — ED Notes (Signed)
Pt c/o left arm pain which has been a chronic issue. Pt is currently receiving treatment for shingles in the left arm. Shingles is healing normally but pain meds are not working according to pt.

## 2012-09-01 NOTE — ED Notes (Signed)
Pt presents to er with left shoulder, left arm pain, pt states that the pain starts at the left scapula/shoulder region and radiates down into left fingertips. Has been evaluated by pcp for pain and numbness but pt states that the pain and numbness became worse a week ago, saw pcp this week, pt states "my doctor thought that the pain was worse from the shingles that I have", pain medication at home is not longer effective for pt.

## 2012-09-02 ENCOUNTER — Emergency Department (HOSPITAL_COMMUNITY): Payer: Medicare Other

## 2012-09-02 ENCOUNTER — Encounter (HOSPITAL_COMMUNITY): Payer: Self-pay

## 2012-09-02 ENCOUNTER — Emergency Department (HOSPITAL_COMMUNITY)
Admission: EM | Admit: 2012-09-02 | Discharge: 2012-09-02 | Disposition: A | Discharge status: Home / Self Care | Payer: Medicare Other | Attending: Emergency Medicine | Admitting: Emergency Medicine

## 2012-09-02 DIAGNOSIS — Z8619 Personal history of other infectious and parasitic diseases: Secondary | ICD-10-CM | POA: Insufficient documentation

## 2012-09-02 DIAGNOSIS — Z79899 Other long term (current) drug therapy: Secondary | ICD-10-CM | POA: Insufficient documentation

## 2012-09-02 DIAGNOSIS — M1990 Unspecified osteoarthritis, unspecified site: Secondary | ICD-10-CM | POA: Insufficient documentation

## 2012-09-02 DIAGNOSIS — Z8679 Personal history of other diseases of the circulatory system: Secondary | ICD-10-CM | POA: Insufficient documentation

## 2012-09-02 DIAGNOSIS — B0229 Other postherpetic nervous system involvement: Secondary | ICD-10-CM

## 2012-09-02 DIAGNOSIS — R6883 Chills (without fever): Secondary | ICD-10-CM | POA: Insufficient documentation

## 2012-09-02 DIAGNOSIS — M79609 Pain in unspecified limb: Secondary | ICD-10-CM | POA: Insufficient documentation

## 2012-09-02 DIAGNOSIS — Z8669 Personal history of other diseases of the nervous system and sense organs: Secondary | ICD-10-CM | POA: Insufficient documentation

## 2012-09-02 DIAGNOSIS — Z87828 Personal history of other (healed) physical injury and trauma: Secondary | ICD-10-CM | POA: Insufficient documentation

## 2012-09-02 DIAGNOSIS — C9210 Chronic myeloid leukemia, BCR/ABL-positive, not having achieved remission: Secondary | ICD-10-CM | POA: Insufficient documentation

## 2012-09-02 HISTORY — DX: Zoster without complications: B02.9

## 2012-09-02 LAB — URINALYSIS, ROUTINE W REFLEX MICROSCOPIC
Bilirubin Urine: NEGATIVE
Hgb urine dipstick: NEGATIVE
Protein, ur: NEGATIVE mg/dL
Urobilinogen, UA: 0.2 mg/dL (ref 0.0–1.0)

## 2012-09-02 MED ORDER — LORAZEPAM 2 MG/ML IJ SOLN
1.0000 mg | Freq: Once | INTRAMUSCULAR | Status: AC
  Administered 2012-09-02: 1 mg via INTRAVENOUS
  Filled 2012-09-02: qty 1

## 2012-09-02 MED ORDER — HYDROMORPHONE HCL PF 1 MG/ML IJ SOLN
1.0000 mg | Freq: Once | INTRAMUSCULAR | Status: AC
  Administered 2012-09-02: 1 mg via INTRAVENOUS
  Filled 2012-09-02: qty 1

## 2012-09-02 MED ORDER — ACETAMINOPHEN 325 MG PO TABS
650.0000 mg | ORAL_TABLET | Freq: Once | ORAL | Status: DC
  Filled 2012-09-02: qty 2

## 2012-09-02 NOTE — ED Notes (Signed)
Patient refused ekg. Stated she had one in office last week . Informed her og importance of new ekg and she still refused. Dr.wickline informed

## 2012-09-02 NOTE — ED Notes (Signed)
Patient was here yesterday for pain from the shingles. States that the pain medication did not help. States she is having pain in the left shoulder, and the way down the left arm, into fingers.

## 2012-09-02 NOTE — ED Provider Notes (Signed)
History     CSN: [id number]  Arrival date & time 09/02/12  0101   First MD Initiated Contact with Patient 09/02/12 0115      Chief Complaint  Patient presents with  . Herpes Zoster     Patient is a 72 y.o. female presenting with rash. The history is provided by the patient.  Rash Location: left arm. Quality: painful   Pain details:    Quality:  Shooting and throbbing   Severity:  Severe   Onset quality:  Gradual   Timing:  Constant   Progression:  Worsening Severity:  Moderate Onset quality:  Gradual Duration:  3 weeks Timing:  Constant Progression:  Worsening Chronicity:  Recurrent Relieved by:  Nothing Exacerbated by: palpation. Associated symptoms: no abdominal pain, no shortness of breath and not vomiting   pt reports she was diagnosed with shingles several wks ago (up to 3 weeks ago) She reports the rash is improved but pain is continuing in her arm She reports pain started in left scapula and radiates into left hand.  She reports "tingling" in the arm.  No new weakness. No trauma to arm.  No cp/sob reported.   She reports home meds are not working.  She was seen in the ED on 2/22, given pain meds but her pain returned and she can not sleep.    Past Medical History  Diagnosis Date  . Osteoarthritis   . CML (chronic myelocytic leukemia)   . History of migraine headaches   . Right knee pain   . Injury of left shoulder   . Blindness of right eye   . Drooping eyelid     left eye  . 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 Shingles     Past Surgical History  Procedure Laterality Date  . Bowel blockage surgery    . Right kidney removed  1980  . Bone marrow bx & asp    . Rt. cataract surgery      Family History  Problem Relation Age of Onset  . Cancer Maternal Uncle     bone  . Cancer Paternal Aunt     uterine  . Cancer Maternal Grandmother     breast  . Cancer Maternal Grandfather     throat  . Cancer Paternal Grandmother     leukemia  .  Cancer Son     colon    History  Substance Use Topics  . Smoking status: Never Smoker   . Smokeless tobacco: Never Used  . Alcohol Use: No    OB History   Grav Para Term Preterm Abortions TAB SAB Ect Mult Living                  Review of Systems  Constitutional: Positive for chills.  Respiratory: Negative for shortness of breath.   Gastrointestinal: Negative for vomiting and abdominal pain.  Musculoskeletal: Negative for back pain.  Skin: Positive for rash.  Psychiatric/Behavioral: Negative for agitation.  All other systems reviewed and are negative.    Allergies  Nubain; Perphenazine; Ibuprofen; Tylenol; and Vistaril  Home Medications   Current Outpatient Rx  Name  Route  Sig  Dispense  Refill  . ALPRAZolam (XANAX) 1 MG tablet   Oral   Take 1 mg by mouth at bedtime as needed.           . baclofen (LIORESAL) 10 MG tablet   Oral   Take 10 mg by mouth as needed.          Marland Kitchen  butorphanol (STADOL) 10 MG/ML nasal spray   Nasal   Place 1 spray into the nose as needed.           . calcium carbonate (OS-CAL) 600 MG TABS   Oral   Take 600 mg by mouth daily.         Tery Sanfilippo Calcium (STOOL SOFTENER PO)   Oral   Take by mouth as needed.           . ergocalciferol (VITAMIN D2) 50000 UNITS capsule   Oral   Take 50,000 Units by mouth once a week.         Marland Kitchen glucosamine-chondroitin 500-400 MG tablet   Oral   Take 1 tablet by mouth 3 (three) times daily.         Marland Kitchen HYDROcodone-acetaminophen (NORCO/VICODIN) 5-325 MG per tablet   Oral   Take 1 tablet by mouth 4 (four) times daily.         Marland Kitchen HYDROmorphone (DILAUDID) 4 MG tablet   Oral   Take 1 tablet (4 mg total) by mouth every 6 (six) hours as needed for pain.   20 tablet   0   . imatinib (GLEEVEC) 400 MG tablet   Oral   Take 1 tablet (400 mg total) by mouth daily. Take with meals and large glass of water.Caution:Chemotherapy.   30 tablet   5   . MAXALT-MLT 10 MG disintegrating tablet    Oral   Take 10 mg by mouth as needed.          . ondansetron (ZOFRAN-ODT) 4 MG disintegrating tablet      4 mg every 8 (eight) hours as needed.          Marland Kitchen oxyCODONE-acetaminophen (PERCOCET/ROXICET) 5-325 MG per tablet   Oral   Take 1 tablet by mouth 4 (four) times daily.         . potassium chloride (K-DUR) 10 MEQ tablet   Oral   Take 10 mEq by mouth daily.           . Sertraline HCl (ZOLOFT PO)   Oral   Take 150 mg by mouth daily.           Marland Kitchen topiramate (TOPAMAX) 25 MG tablet   Oral   Take 25 mg by mouth 2 (two) times daily. 2 pills bid          . zolpidem (AMBIEN) 10 MG tablet   Oral   Take 10 mg by mouth at bedtime as needed.             BP 166/81  Pulse 86  Temp(Src) 100 F (37.8 C) (Oral)  Resp 20  Wt 119 lb (53.978 kg)  BMI 21.76 kg/m2  SpO2 100% BP 151/76  Pulse 82  Temp(Src) 98.5 F (36.9 C) (Oral)  Resp 14  Wt 119 lb (53.978 kg)  BMI 21.76 kg/m2  SpO2 97%   Physical Exam CONSTITUTIONAL: elderly, mildly anxious HEAD: Normocephalic/atraumatic EYES: EOMI/PERRL ENMT: Mucous membranes moist NECK: supple no meningeal signs SPINE:entire spine nontender.  Tender to left scapula but no deformity CV: S1/S2 noted LUNGS: Lungs are clear to auscultation bilaterally, no apparent distress ABDOMEN: soft, nontender, no rebound or guarding GU:no cva tenderness NEURO: Pt is awake/alert, moves all extremitiesx4. She reports "tingling" to left UE but no sensory deficit. Equal power withwrist flex/extension, elbow flex/extension, and equal power with shoulder abduction/adduction. Equal biceps/brachioradial reflex in bilateral UE EXTREMITIES: pulses normal, full ROM. Tenderness to palpation of left shoulder and  left arm.  No deformity or bruising noted SKIN: warm, color normal.  Healing Rash to left UE.  No overlying erythema/drainage/crepitance/abscess noted PSYCH: anxious  ED Course  Procedures  Labs Reviewed  URINALYSIS, ROUTINE W REFLEX  MICROSCOPIC   2:10 AM Notes and labs from ED visit from 02/22 reviewed Will treat pain.  Will also add on urine (pt reports chills) and CXR.  Also will add on EKG.   2:11 AM Pt is refusing any meds for possible fever and refuses EKG 4:00 AM Pt still with arm pain despite multiple doses of dilaudid.   She has had pain for weeks.  Suspect this could be due to post herpetic neuralgia She has equal distal pulses, doubt vascular occlusion as cause No signs of cellulitis or DVT.   She may have some cervical radiculopathy as cause of pain, however no acute weakness to suggest need for surgical intervention I spoke to hospitalist to gain insight on further meds for pain control.  It was recommended that lyrica may be an option for patient 4:28 AM After discussion with patient/family and reviewing med list, she is actually supposed to be on neurontin BID.  She had stopped taking it because she did not think it worked.  I advised this may be her best option for post herpetic neuralgia I advised caution with taking neurontin with pain meds.  She had supposed to be on topamax as well but she stopped this on her own and this was removed from her med list.  Given all of the pain meds/anticonvulsants/ambien/xanax there is a high risk of oversedation.  Patient and family understand that mixing medications can cause sedation and she should only take oxycodone if pain is severe and not improving while at home.  She will avoid taking the oral dilaudid for now.  I advised her to call her PCP in one day to review all of her medications and further guidance on her use of neurontin.  Pt told me that it was mentioned to her by her PCP that she may require lyrica if neurontin does not work.   Currently pt appears improved and in no distress MDM  Nursing notes including past medical history and social history reviewed and considered in documentation Labs/vital reviewed and considered Previous records reviewed and  considered - recent labs and ED notes reviewed         Joya Gaskins, MD 09/02/12 [phone]

## 2012-09-02 NOTE — ED Notes (Signed)
Blankets given to patient

## 2012-09-10 ENCOUNTER — Telehealth (HOSPITAL_COMMUNITY): Payer: Self-pay

## 2012-09-10 NOTE — Telephone Encounter (Signed)
Correction - pain med she is taking is oxycodone, not hydrocodone.

## 2012-09-10 NOTE — Telephone Encounter (Signed)
Message copied by Evelena Leyden on Mon Sep 10, 2012  5:11 PM ------      Message from: Mariel Sleet, ERIC S      Created: Mon Sep 10, 2012  4:08 PM       We could try gabapentin if she wants      100 mg TID, then increase if needed      #100      She would need to call us in a week ------

## 2012-09-10 NOTE — Telephone Encounter (Signed)
Call from patient, complaining of severe burning pain in her left arm and hand.  States "I had shingles a month ago on my left arm and I'm having more pain now.  It's the worst pain I've ever had.  I saw Dr. Juanetta Gosling this morning and he said there was nothing else he could do.  I'm taking my hydrocodone every 5 hours and it's not relieving my discomfort.  They took me off the dilaudid because of my kidneys.  Just wanted to know if Dr. Mariel Sleet could do anything for me."

## 2012-09-10 NOTE — Telephone Encounter (Signed)
Spoke with Ana Nelson and she stated that "I was taken off the gabapentin before and put on Lyrica.  I'm planning to restart the Lyrica tonight."  Instructed that she needs to call us in a week and let us know how she's doing.  Verbalized understanding.  Dr. Mariel Sleet notified.

## 2012-09-19 ENCOUNTER — Observation Stay (HOSPITAL_COMMUNITY)
Admission: RE | Admit: 2012-09-19 | Discharge: 2012-09-20 | Disposition: A | Discharge status: Home / Self Care | Payer: Medicare Other | Source: Ambulatory Visit | Attending: Pulmonary Disease | Admitting: Pulmonary Disease

## 2012-09-19 ENCOUNTER — Observation Stay (HOSPITAL_COMMUNITY): Payer: Medicare Other

## 2012-09-19 ENCOUNTER — Encounter (HOSPITAL_COMMUNITY): Payer: Self-pay | Admitting: Unknown Physician Specialty

## 2012-09-19 DIAGNOSIS — R4182 Altered mental status, unspecified: Principal | ICD-10-CM | POA: Insufficient documentation

## 2012-09-19 DIAGNOSIS — F411 Generalized anxiety disorder: Secondary | ICD-10-CM | POA: Insufficient documentation

## 2012-09-19 DIAGNOSIS — B029 Zoster without complications: Secondary | ICD-10-CM | POA: Insufficient documentation

## 2012-09-19 DIAGNOSIS — C9501 Acute leukemia of unspecified cell type, in remission: Secondary | ICD-10-CM | POA: Insufficient documentation

## 2012-09-19 DIAGNOSIS — C9210 Chronic myeloid leukemia, BCR/ABL-positive, not having achieved remission: Secondary | ICD-10-CM

## 2012-09-19 LAB — COMPREHENSIVE METABOLIC PANEL
AST: 29 U/L (ref 0–37)
Albumin: 4.1 g/dL (ref 3.5–5.2)
BUN: 30 mg/dL — ABNORMAL HIGH (ref 6–23)
Calcium: 9.4 mg/dL (ref 8.4–10.5)
Chloride: 102 mEq/L (ref 96–112)
Creatinine, Ser: 1.57 mg/dL — ABNORMAL HIGH (ref 0.50–1.10)
Total Protein: 6.2 g/dL (ref 6.0–8.3)

## 2012-09-19 LAB — CBC WITH DIFFERENTIAL/PLATELET
Eosinophils Absolute: 0.1 10*3/uL (ref 0.0–0.7)
Eosinophils Relative: 1 % (ref 0–5)
HCT: 27.4 % — ABNORMAL LOW (ref 36.0–46.0)
Hemoglobin: 9.2 g/dL — ABNORMAL LOW (ref 12.0–15.0)
Lymphocytes Relative: 17 % (ref 12–46)
Lymphs Abs: 1.2 10*3/uL (ref 0.7–4.0)
MCH: 35 pg — ABNORMAL HIGH (ref 26.0–34.0)
MCV: 104.2 fL — ABNORMAL HIGH (ref 78.0–100.0)
Monocytes Absolute: 0.6 10*3/uL (ref 0.1–1.0)
Monocytes Relative: 8 % (ref 3–12)
Platelets: 93 10*3/uL — ABNORMAL LOW (ref 150–400)
RBC: 2.63 MIL/uL — ABNORMAL LOW (ref 3.87–5.11)
WBC: 7.3 10*3/uL (ref 4.0–10.5)

## 2012-09-19 MED ORDER — ENOXAPARIN SODIUM 40 MG/0.4ML ~~LOC~~ SOLN: 40.0000 mg | SUBCUTANEOUS | Status: DC

## 2012-09-19 MED ORDER — OXYCODONE HCL 5 MG PO TABS
5.0000 mg | ORAL_TABLET | ORAL | Status: DC | PRN
  Administered 2012-09-19 – 2012-09-20 (×2): 5 mg via ORAL
  Filled 2012-09-19 (×2): qty 1

## 2012-09-19 MED ORDER — PREDNISONE 20 MG PO TABS
40.0000 mg | ORAL_TABLET | Freq: Every day | ORAL | Status: DC
  Administered 2012-09-20: 40 mg via ORAL
  Filled 2012-09-19: qty 2

## 2012-09-19 MED ORDER — POTASSIUM CHLORIDE CRYS ER 10 MEQ PO TBCR
10.0000 meq | EXTENDED_RELEASE_TABLET | Freq: Every day | ORAL | Status: DC
  Administered 2012-09-19: 10 meq via ORAL
  Filled 2012-09-19 (×4): qty 1

## 2012-09-19 MED ORDER — DEXTROSE 5 % IV SOLN
INTRAVENOUS | Status: DC
  Administered 2012-09-19: 15:00:00 via INTRAVENOUS

## 2012-09-19 MED ORDER — ENOXAPARIN SODIUM 30 MG/0.3ML ~~LOC~~ SOLN
30.0000 mg | SUBCUTANEOUS | Status: DC
  Administered 2012-09-19: 30 mg via SUBCUTANEOUS
  Filled 2012-09-19: qty 0.3

## 2012-09-19 MED ORDER — RIZATRIPTAN BENZOATE 10 MG PO TBDP: 10.0000 mg | ORAL_TABLET | ORAL | Status: DC | PRN

## 2012-09-19 MED ORDER — SODIUM CHLORIDE 0.9 % IJ SOLN
3.0000 mL | Freq: Two times a day (BID) | INTRAMUSCULAR | Status: DC
  Administered 2012-09-19: 3 mL via INTRAVENOUS

## 2012-09-19 MED ORDER — SODIUM CHLORIDE 0.9 % IV SOLN: INTRAVENOUS | Status: DC

## 2012-09-19 MED ORDER — SUMATRIPTAN SUCCINATE 50 MG PO TABS
100.0000 mg | ORAL_TABLET | ORAL | Status: DC | PRN
  Filled 2012-09-19: qty 2

## 2012-09-19 MED ORDER — BUTORPHANOL TARTRATE 10 MG/ML NA SOLN: 1.0000 | NASAL | Status: DC | PRN

## 2012-09-19 MED ORDER — ALPRAZOLAM 1 MG PO TABS
1.0000 mg | ORAL_TABLET | Freq: Every evening | ORAL | Status: DC | PRN
  Administered 2012-09-19: 1 mg via ORAL
  Filled 2012-09-19: qty 1

## 2012-09-19 MED ORDER — ERGOCALCIFEROL 1.25 MG (50000 UT) PO CAPS: 50000.0000 [IU] | ORAL_CAPSULE | ORAL | Status: DC

## 2012-09-19 MED ORDER — ONDANSETRON HCL 4 MG PO TABS: 4.0000 mg | ORAL_TABLET | Freq: Four times a day (QID) | ORAL | Status: DC | PRN

## 2012-09-19 MED ORDER — IMATINIB MESYLATE 400 MG PO TABS
400.0000 mg | ORAL_TABLET | Freq: Every day | ORAL | Status: DC
  Administered 2012-09-19: 400 mg via ORAL
  Filled 2012-09-19 (×3): qty 1

## 2012-09-19 MED ORDER — ONDANSETRON HCL 4 MG/2ML IJ SOLN: 4.0000 mg | Freq: Four times a day (QID) | INTRAMUSCULAR | Status: DC | PRN

## 2012-09-19 MED ORDER — SERTRALINE HCL 50 MG PO TABS
50.0000 mg | ORAL_TABLET | Freq: Every day | ORAL | Status: DC
  Administered 2012-09-19: 50 mg via ORAL
  Filled 2012-09-19: qty 1

## 2012-09-19 MED ORDER — AMITRIPTYLINE HCL 25 MG PO TABS
25.0000 mg | ORAL_TABLET | Freq: Every day | ORAL | Status: DC
  Administered 2012-09-19: 25 mg via ORAL
  Filled 2012-09-19: qty 1

## 2012-09-19 MED ORDER — ALUM & MAG HYDROXIDE-SIMETH 200-200-20 MG/5ML PO SUSP: 30.0000 mL | Freq: Four times a day (QID) | ORAL | Status: DC | PRN

## 2012-09-20 LAB — TSH: TSH: 2.275 u[IU]/mL (ref 0.350–4.500)

## 2012-09-20 NOTE — Progress Notes (Signed)
Subjective: She is much improved this morning. She's complaining that her left elbow is painful. She is more alert and oriented.  Objective: Vital signs in last 24 hours: Temp:  [98.5 F (36.9 C)-99.1 F (37.3 C)] 98.5 F (36.9 C) (03/13 0422) Pulse Rate:  [64-75] 64 (03/13 0422) Resp:  [16-20] 16 (03/13 0422) BP: (110-137)/(62-80) 136/80 mmHg (03/13 0422) SpO2:  [95 %-100 %] 97 % (03/13 0422) Weight:  [48.8 kg (107 lb 9.4 oz)-49.5 kg (109 lb 2 oz)] 49.5 kg (109 lb 2 oz) (03/13 0422) Weight change:  Last BM Date: 09/18/12  Intake/Output from previous day: 03/12 0701 - 03/13 0700 In: 771 [P.O.:480; I.V.:291] Out: 1150 [Urine:1150]  PHYSICAL EXAM General appearance: alert, cooperative and no distress Resp: clear to auscultation bilaterally Cardio: regular rate and rhythm, S1, S2 normal, no murmur, click, rub or gallop GI: soft, non-tender; bowel sounds normal; no masses,  no organomegaly Extremities: extremities normal, atraumatic, no cyanosis or edema  Lab Results:    Basic Metabolic Panel:  Recent Labs  16/10/96 1422  NA 139  K 3.2*  CL 102  CO2 27  GLUCOSE 126*  BUN 30*  CREATININE 1.57*  CALCIUM 9.4   Liver Function Tests:  Recent Labs  09/19/12 1422  AST 29  ALT 23  ALKPHOS 65  BILITOT 0.4  PROT 6.2  ALBUMIN 4.1   No results found for this basename: LIPASE, AMYLASE,  in the last 72 hours No results found for this basename: AMMONIA,  in the last 72 hours CBC:  Recent Labs  09/19/12 1422  WBC 7.3  NEUTROABS 5.5  HGB 9.2*  HCT 27.4*  MCV 104.2*  PLT 93*   Cardiac Enzymes: No results found for this basename: CKTOTAL, CKMB, CKMBINDEX, TROPONINI,  in the last 72 hours BNP: No results found for this basename: PROBNP,  in the last 72 hours D-Dimer: No results found for this basename: DDIMER,  in the last 72 hours CBG: No results found for this basename: GLUCAP,  in the last 72 hours Hemoglobin A1C: No results found for this basename:  HGBA1C,  in the last 72 hours Fasting Lipid Panel: No results found for this basename: CHOL, HDL, LDLCALC, TRIG, CHOLHDL, LDLDIRECT,  in the last 72 hours Thyroid Function Tests:  Recent Labs  09/19/12 1422  TSH 2.275   Anemia Panel: No results found for this basename: VITAMINB12, FOLATE, FERRITIN, TIBC, IRON, RETICCTPCT,  in the last 72 hours Coagulation: No results found for this basename: LABPROT, INR,  in the last 72 hours Urine Drug Screen: Drugs of Abuse  No results found for this basename: labopia, cocainscrnur, labbenz, amphetmu, thcu, labbarb    Alcohol Level: No results found for this basename: ETH,  in the last 72 hours Urinalysis: No results found for this basename: COLORURINE, APPERANCEUR, LABSPEC, PHURINE, GLUCOSEU, HGBUR, BILIRUBINUR, KETONESUR, PROTEINUR, UROBILINOGEN, NITRITE, LEUKOCYTESUR,  in the last 72 hours Misc. Labs:  ABGS No results found for this basename: PHART, PCO2, PO2ART, TCO2, HCO3,  in the last 72 hours CULTURES No results found for this or any previous visit (from the past 240 hour(s)). Studies/Results: Mr Sherrin Daisy Contrast  09/19/2012  *RADIOLOGY REPORT*  Clinical Data: Mental status changes.  Confusion.  Weakness. Speech disturbance.  Left-sided weakness.  MRI HEAD WITHOUT CONTRAST  Technique:  Multiplanar, multiecho pulse sequences of the brain and surrounding structures were obtained according to standard protocol without intravenous contrast.  Comparison: Head CT 08/24/2012  Findings: Diffusion imaging does not show any acute or  subacute infarction.  The brainstem and cerebellum are normal.  The cerebral hemispheres show atrophy with moderate chronic small vessel changes affecting the deep and subcortical white matter.  No cortical or large vessel territory infarction.  No mass lesion, hemorrhage, hydrocephalus or extra-axial collection.  No pituitary mass.  No inflammatory sinus disease.  No skull or skull base lesion.  IMPRESSION: Moderate  chronic appearing small vessel changes throughout the cerebral hemispheric white matter.  No acute or reversible process.   Original Report Authenticated By: Paulina Fusi, M.D.     Medications:  Prior to Admission:  Prescriptions prior to admission  Medication Sig Dispense Refill  . ALPRAZolam (XANAX) 1 MG tablet Take 1 mg by mouth 3 (three) times daily as needed for sleep.      Marland Kitchen amitriptyline (ELAVIL) 25 MG tablet Take 25 mg by mouth 5 (five) times daily.      . butorphanol (STADOL) 10 MG/ML nasal spray Place 1 spray into the nose every 4 (four) hours as needed (migraines).      . docusate sodium (COLACE) 100 MG capsule Take 100 mg by mouth 2 (two) times daily as needed for constipation.      Marland Kitchen HYDROcodone-acetaminophen (NORCO/VICODIN) 5-325 MG per tablet Take 1 tablet by mouth 4 (four) times daily.      Marland Kitchen imatinib (GLEEVEC) 400 MG tablet Take 1 tablet (400 mg total) by mouth daily. Take with meals and large glass of water.Caution:Chemotherapy.  30 tablet  5  . ondansetron (ZOFRAN-ODT) 4 MG disintegrating tablet 4 mg every 8 (eight) hours as needed.       . potassium chloride (K-DUR) 10 MEQ tablet Take 10 mEq by mouth daily.        . rizatriptan (MAXALT-MLT) 10 MG disintegrating tablet Take 10 mg by mouth as needed for migraine. May repeat in 2 hours if needed      . sertraline (ZOLOFT) 50 MG tablet Take 50 mg by mouth daily.      Marland Kitchen zolpidem (AMBIEN) 10 MG tablet Take 10 mg by mouth at bedtime as needed for sleep.       Scheduled: . amitriptyline  25 mg Oral QHS  . enoxaparin (LOVENOX) injection  30 mg Subcutaneous Q24H  . imatinib  400 mg Oral Daily  . potassium chloride  10 mEq Oral Daily  . predniSONE  40 mg Oral Q breakfast  . sertraline  50 mg Oral Daily  . sodium chloride  3 mL Intravenous Q12H   Continuous: . dextrose 20 mL/hr at 09/19/12 1436   UXL:[id number], alum & mag hydroxide-simeth, butorphanol, ondansetron (ZOFRAN) IV, ondansetron, oxyCODONE, SUMAtriptan  Assesment:  She was admitted with altered mental status. She is much improved. Dr. Gerilyn Pilgrim the neurologist is getting ready to see her but I think she can be discharged home and less he has something that needs to be done as an inpatient. I think this is probably from a combination of medications. Active Problems:   * No active hospital problems. *    Plan: Discharge home unless she has some sort of inpatient procedure that needs to be done from a neurological point of view    LOS: 1 day   HAWKINS,EDWARD L 09/20/2012, 8:51 AM

## 2012-09-20 NOTE — Consult Note (Signed)
[facility] Kofi A. Gerilyn Pilgrim, MD     [email or web address]          Ana Nelson is an 72 y.o. female.   ASSESSMENT/PLAN: Altered mental status resolved likely due to medication effect. The main culprit Lyrica has been discontinued. However, appears that the effect was accommodation of Lyrica and oxycodone. She continues to have these events, an EEG is suggested.  Chronic daily migraine headaches. The patient has been started on amitriptyline for postherpetic neuralgia. This as they can also be used for headache prophylaxis. Given her age, I would recommend a low dose number greater and 50 mg at nighttime.  Postherpetic neuralgia. The patient is a 72 year old white female who has had at least 2 episodes of altered mental status. Dr. Juanetta Gosling believe that she may have had one event but to some weeks ago before she was started on Lyrica. The patient reports that she was started on Lyrica about 19 February. It appears that she may have had some mild confusion with the Lyrica. She also was having what appears to be jerky movements and tremors. The confusion got a lot worse last night after he took the Lyrica with oxycodone. She got dramatically confused and disoriented along with having some agitation. She was taken to the hospital and admitted. The cognition has improved. She is not baseline. The husband reports that she may have had some other minor spells of confusion and before she was on the Lyrica. The patient had a head CT scan done 2 weeks ago because of the confusion. This was negative. A brain MRI done during this admission has been unremarkable. The patient reports that she has a long-standing history of episodic headaches dating back since he was 72 years old. She does have frequent headaches. Month of February she has had about 15-18 headache days. The headaches are typically bilateral, associated with nausea, vomiting, photophobia and phonophobia. The patient tells me that she  has been seen in [facility] clinic for headaches. She has tried Topamax which did help her for unknown reasons this was discontinued. She did go up to 50 mg. It appears that she lost about 20 pounds on this medication and this may be why the medication was discontinued by the patient. She has tried Botox injections but this was not effective. They are visiting symptoms is otherwise unremarkable.  GENERAL: This is a thin pleasant lady in no acute distress.  HEENT: Retro-palatal space   ABDOMEN: soft  EXTREMITIES: No edema   BACK: Unremarkable.  SKIN: Normal by inspection.    MENTAL STATUS: Alert and oriented. Speech, language and cognition are generally intact. Judgment and insight normal.   CRANIAL NERVES: Pupils are equal, round and reactive to light and accomodation; extra ocular movements are full, there is no significant nystagmus. However, the patient seemed to have overshooting jerkiness with eye movements.; visual fields are full; upper and lower facial muscles are normal in strength and symmetric, there is no flattening of the nasolabial folds; tongue is midline; uvula is midline; shoulder elevation is normal.  MOTOR: Normal tone, bulk and strength; no pronator drift.  COORDINATION: Finger to nose is normal on the right. On the left, she seems overshoot consistently. There is no dysmetria. There is no tremors. No parkinsonism.  REFLEXES: Deep tendon reflexes are symmetrical and normal. Plantar responses are flexor bilaterally.   SENSATION: Normal to light touch.     Past Medical History  Diagnosis Date  . Osteoarthritis   . CML (chronic  myelocytic leukemia)   . History of migraine headaches   . Right knee pain   . Injury of left shoulder   . Blindness of right eye   . Drooping eyelid     left eye  . 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 Shingles     Past Surgical History  Procedure Laterality Date  . Bowel blockage surgery    . Right  kidney removed  1980  . Bone marrow bx & asp    . Rt. cataract surgery      Family History  Problem Relation Age of Onset  . Cancer Maternal Uncle     bone  . Cancer Paternal Aunt     uterine  . Cancer Maternal Grandmother     breast  . Cancer Maternal Grandfather     throat  . Cancer Paternal Grandmother     leukemia  . Cancer Son     colon    Social History:  reports that she has never smoked. She has never used smokeless tobacco. She reports that she does not drink alcohol or use illicit drugs.  Allergies:  Allergies  Allergen Reactions  . Nubain (Nalbuphine Hcl) Swelling  . Perphenazine     seizures  . Ibuprofen   . Tylenol (Acetaminophen)   . Vistaril (Hydroxyzine Hcl) Other (See Comments)    Made my tongue swell    Medications: Prior to Admission medications   Medication Sig Start Date End Date Taking? Authorizing Provider  ALPRAZolam Prudy Feeler) 1 MG tablet Take 1 mg by mouth 3 (three) times daily as needed for sleep.   Yes Historical Provider, MD  amitriptyline (ELAVIL) 25 MG tablet Take 25 mg by mouth 5 (five) times daily.   Yes Historical Provider, MD  butorphanol (STADOL) 10 MG/ML nasal spray Place 1 spray into the nose every 4 (four) hours as needed (migraines).   Yes Historical Provider, MD  docusate sodium (COLACE) 100 MG capsule Take 100 mg by mouth 2 (two) times daily as needed for constipation.   Yes Historical Provider, MD  HYDROcodone-acetaminophen (NORCO/VICODIN) 5-325 MG per tablet Take 1 tablet by mouth 4 (four) times daily.   Yes Historical Provider, MD  imatinib (GLEEVEC) 400 MG tablet Take 1 tablet (400 mg total) by mouth daily. Take with meals and large glass of water.Caution:Chemotherapy. 05/04/12  Yes Maurine Minister Kefalas, PA-C  ondansetron (ZOFRAN-ODT) 4 MG disintegrating tablet 4 mg every 8 (eight) hours as needed.  04/13/12  Yes Historical Provider, MD  potassium chloride (K-DUR) 10 MEQ tablet Take 10 mEq by mouth daily.     Yes Historical Provider,  MD  rizatriptan (MAXALT-MLT) 10 MG disintegrating tablet Take 10 mg by mouth as needed for migraine. May repeat in 2 hours if needed   Yes Historical Provider, MD  sertraline (ZOLOFT) 50 MG tablet Take 50 mg by mouth daily.   Yes Historical Provider, MD  zolpidem (AMBIEN) 10 MG tablet Take 10 mg by mouth at bedtime as needed for sleep.   Yes Historical Provider, MD    Scheduled Meds: . amitriptyline  25 mg Oral QHS  . enoxaparin (LOVENOX) injection  30 mg Subcutaneous Q24H  . imatinib  400 mg Oral Daily  . potassium chloride  10 mEq Oral Daily  . predniSONE  40 mg Oral Q breakfast  . sertraline  50 mg Oral Daily  . sodium chloride  3 mL Intravenous Q12H   Continuous Infusions: . dextrose 20 mL/hr at 09/19/12 1436   PRN Meds:.ALPRAZolam,  alum & mag hydroxide-simeth, butorphanol, ondansetron (ZOFRAN) IV, ondansetron, oxyCODONE, SUMAtriptan  Blood pressure 136/80, pulse 64, temperature 98.5 F (36.9 C), temperature source Oral, resp. rate 16, height 5\' 1"  (1.549 m), weight 49.5 kg (109 lb 2 oz), SpO2 97.00%.   Results for orders placed during the hospital encounter of 09/19/12 (from the past 48 hour(s))  COMPREHENSIVE METABOLIC PANEL     Status: Abnormal   Collection Time    09/19/12  2:22 PM      Result Value Range   Sodium 139  135 - 145 mEq/L   Potassium 3.2 (*) 3.5 - 5.1 mEq/L   Chloride 102  96 - 112 mEq/L   CO2 27  19 - 32 mEq/L   Glucose, Bld 126 (*) 70 - 99 mg/dL   BUN 30 (*) 6 - 23 mg/dL   Creatinine, Ser 1.61 (*) 0.50 - 1.10 mg/dL   Calcium 9.4  8.4 - 09.6 mg/dL   Total Protein 6.2  6.0 - 8.3 g/dL   Albumin 4.1  3.5 - 5.2 g/dL   AST 29  0 - 37 U/L   ALT 23  0 - 35 U/L   Alkaline Phosphatase 65  39 - 117 U/L   Total Bilirubin 0.4  0.3 - 1.2 mg/dL   GFR calc non Af Amer 32 (*) >90 mL/min   GFR calc Af Amer 37 (*) >90 mL/min   Comment:            The eGFR has been calculated     using the CKD EPI equation.     This calculation has not been     validated in all  clinical     situations.     eGFR's persistently     <90 mL/min signify     possible Chronic Kidney Disease.  CBC WITH DIFFERENTIAL     Status: Abnormal   Collection Time    09/19/12  2:22 PM      Result Value Range   WBC 7.3  4.0 - 10.5 K/uL   RBC 2.63 (*) 3.87 - 5.11 MIL/uL   Hemoglobin 9.2 (*) 12.0 - 15.0 g/dL   HCT 04.5 (*) 40.9 - 81.1 %   MCV 104.2 (*) 78.0 - 100.0 fL   MCH 35.0 (*) 26.0 - 34.0 pg   MCHC 33.6  30.0 - 36.0 g/dL   RDW 91.4 (*) 78.2 - 95.6 %   Platelets 93 (*) 150 - 400 K/uL   Neutrophils Relative 75  43 - 77 %   Neutro Abs 5.5  1.7 - 7.7 K/uL   Lymphocytes Relative 17  12 - 46 %   Lymphs Abs 1.2  0.7 - 4.0 K/uL   Monocytes Relative 8  3 - 12 %   Monocytes Absolute 0.6  0.1 - 1.0 K/uL   Eosinophils Relative 1  0 - 5 %   Eosinophils Absolute 0.1  0.0 - 0.7 K/uL   Basophils Relative 0  0 - 1 %   Basophils Absolute 0.0  0.0 - 0.1 K/uL  TSH     Status: None   Collection Time    09/19/12  2:22 PM      Result Value Range   TSH 2.275  0.350 - 4.500 uIU/mL    Mr Brain Wo Contrast  09/19/2012  *RADIOLOGY REPORT*  Clinical Data: Mental status changes.  Confusion.  Weakness. Speech disturbance.  Left-sided weakness.  MRI HEAD WITHOUT CONTRAST  Technique:  Multiplanar, multiecho pulse sequences of the brain  and surrounding structures were obtained according to standard protocol without intravenous contrast.  Comparison: Head CT 08/24/2012  Findings: Diffusion imaging does not show any acute or subacute infarction.  The brainstem and cerebellum are normal.  The cerebral hemispheres show atrophy with moderate chronic small vessel changes affecting the deep and subcortical white matter.  No cortical or large vessel territory infarction.  No mass lesion, hemorrhage, hydrocephalus or extra-axial collection.  No pituitary mass.  No inflammatory sinus disease.  No skull or skull base lesion.  IMPRESSION: Moderate chronic appearing small vessel changes throughout the cerebral  hemispheric white matter.  No acute or reversible process.   Original Report Authenticated By: Paulina Fusi, M.D.         Kofi A. Gerilyn Pilgrim, M.D.  Diplomate, Biomedical engineer of Psychiatry and Neurology ( Neurology). 09/20/2012, 8:50 AM

## 2012-09-20 NOTE — H&P (Signed)
NAMEMarland Kitchen  Ana Nelson, Ana Nelson NO.:  [id number]  MEDICAL RECORD NO.:  [id number]  LOCATION:  A318                          FACILITY:  [facility]  PHYSICIAN:  Edward L. Juanetta Gosling, M.D.DATE OF BIRTH:  07/02/1941  DATE OF ADMISSION:  09/19/2012 DATE OF DISCHARGE:  03/13/2014LH                             HISTORY & PHYSICAL   REASON FOR ADMISSION:  Change in mental status.  HISTORY:  This is a 72 year old who was found to have shingles about 2 to 3 weeks ago.  She has a long history of chronic migraine headaches and has had a significant amount of pain, medication use, and she was difficult to control as far as her pain was concerned.  She had an episode of confusion after she was diagnosed with shingles and that was thought to be likely related to urinary tract infection.  She came to the office on the morning of admission with increasing problems with confusion, was still having some alteration in her speech.  PAST MEDICAL HISTORY:  Positive for chronic myelocytic leukemia, in remission on Gleevec migraine headaches, chronic right knee pain, shingles,chronic osteoarthritis.  PAST SURGICAL HISTORY:  She has had removal of a kidney.  She has had cataract surgery and she has had surgery for a bowel obstruction.  FAMILY HISTORY:  There is a history of bone cancer in her uncle and uterine cancer in her aunt.  Several other cancers in family members.  SOCIAL HISTORY:  She lives at home with her husband.  She is a nonsmoker.  REVIEW OF SYSTEMS:  Except as mentioned, she may be had a change in her medications that caused some of the problem.  She has not had any fever, chills, nausea, vomiting, seizures.  MEDICATIONS:  Her medication list, I do not think, is quite accurate but as best I can tell, she takes Xanax 1 mg at bedtime, Baclofen 10 mg as needed, Stadol nasal spray as needed, calcium carbonate daily, stool softener daily, vitamin D 50,000 units weekly, glucosamine  chondroitin 1 three times a day, Norco 5/325 four times a day as needed.  She had some Dilaudid but is not taking it.  Gleevec 400 mg daily, Maxalt-MLT 10 mg as needed, Zofran 4 mg every 8 hours as needed, oxycodone 5 mg as needed for pain, this is new since she has had the shingles, potassium chloride 10 mEq daily, Zoloft 150 mg daily, Topamax 25 mg 2 tablets b.i.d., zolpidem 10 mg at bedtime.  She has been on Elavil recently and prednisone, I did not prescribe these and I am not sure of the dose, and she has been on some Lyrica.  PHYSICAL EXAMINATION:  HEENT:  Her exam shows the pupils are reactive. Nose and throat are clear.  Mucous membranes are moist. GENERAL:  She is pale. NECK:  Supple. CHEST:  Relatively clear. HEART:  Regular. ABDOMEN:  Soft. EXTREMITIES:  She has no edema. CENTRAL NERVOUS SYSTEM:  She is confused.  ASSESSMENT:  She has acute confusion, I think it is probably related to medications, but she is going to be admitted for MRI.  Neurology consultation and Psychiatric consultation.     Edward L. Juanetta Gosling, M.D.  ELH/MEDQ  D:  09/20/2012  T:  09/20/2012  Job:  [id number]

## 2012-09-20 NOTE — Progress Notes (Signed)
Patient discharged home. Verbalizes understanding. Husband at bedside awaiting transport out. Cancer medications returned to patient from pharmacy.

## 2012-09-21 LAB — URINE CULTURE: Colony Count: 6000

## 2012-09-23 NOTE — Discharge Summary (Signed)
Physician Discharge Summary  Patient ID: Ana Nelson MRN: [id number] DOB/AGE: [birth year] 72 y.o. Primary Care Physician:Kaylem Gidney L, MD Admit date: 09/19/2012 Discharge date: 09/23/2012    Discharge Diagnoses:  Altered mental status Herpes zoster Acute leukemia in remission Chronic migraine headaches Anxiety Active Problems:   * No active hospital problems. *     Medication List    TAKE these medications       ALPRAZolam 1 MG tablet  Commonly known as:  XANAX  Take 1 mg by mouth 3 (three) times daily as needed for sleep.     amitriptyline 25 MG tablet  Commonly known as:  ELAVIL  Take 25 mg by mouth 5 (five) times daily.     butorphanol 10 MG/ML nasal spray  Commonly known as:  STADOL  Place 1 spray into the nose every 4 (four) hours as needed (migraines).     docusate sodium 100 MG capsule  Commonly known as:  COLACE  Take 100 mg by mouth 2 (two) times daily as needed for constipation.     HYDROcodone-acetaminophen 5-325 MG per tablet  Commonly known as:  NORCO/VICODIN  Take 1 tablet by mouth 4 (four) times daily.     imatinib 400 MG tablet  Commonly known as:  GLEEVEC  Take 1 tablet (400 mg total) by mouth daily. Take with meals and large glass of water.Caution:Chemotherapy.     ondansetron 4 MG disintegrating tablet  Commonly known as:  ZOFRAN-ODT  4 mg every 8 (eight) hours as needed.     potassium chloride 10 MEQ tablet  Commonly known as:  K-DUR  Take 10 mEq by mouth daily.     rizatriptan 10 MG disintegrating tablet  Commonly known as:  MAXALT-MLT  Take 10 mg by mouth as needed for migraine. May repeat in 2 hours if needed     sertraline 50 MG tablet  Commonly known as:  ZOLOFT  Take 50 mg by mouth daily.     zolpidem 10 MG tablet  Commonly known as:  AMBIEN  Take 10 mg by mouth at bedtime as needed for sleep.        Discharged Condition: Improved    Consults: Neurology  Significant Diagnostic Studies: Dg Chest 2  View  09/02/2012  *RADIOLOGY REPORT*  Clinical Data: Chest pain.  History of shingles.  CHEST - 2 VIEW  Comparison: 05/08/2012  Findings: Mild cardiac enlargement with normal pulmonary vascularity.  Pulmonary hyperinflation.  Central airways thickening suggesting chronic bronchitis or reactive airways disease.  No blunting of costophrenic angles.  No pneumothorax.  No focal consolidation or airspace disease in the lungs.  Degenerative changes in the spine.  IMPRESSION: Hyperinflation and central airways disease suggesting chronic bronchitis or reactive airways disease.  Cardiac enlargement with normal pulmonary vascularity.  No active infiltration.   Original Report Authenticated By: Burman Nieves, M.D.    Dg Cervical Spine Complete  08/29/2012  *RADIOLOGY REPORT*  Clinical Data: 72 year old female left arm numbness.  CERVICAL SPINE - COMPLETE 4+ VIEW  Comparison: 03/01/2007.  Findings: Prevertebral soft tissue contour is within normal limits. Stable cervical vertebral height and alignment, with straightening of lower cervical lordosis and chronic retrolisthesis of C3 on C4. Chronic posterior disc space loss at C3-C4.  Chronic disc space loss at C6-C7 has progressed along with degenerative spurring at that level. Bilateral posterior element alignment is within normal limits.  There is chronic severe cervical facet degeneration and left greater than right and maximal at C4-C5, C5-C6, and C6-C7.  AP alignment is stable.  Lung apices are clear.  Left chest port present before is no longer identified.  Calcified carotid atherosclerosis in the neck.  C1-C2 alignment and odontoid within normal limits. Cervicothoracic junction alignment is within normal limits.  IMPRESSION: 1.  No acute osseous abnormality identified in the cervical spine. 2.  Chronic C3-C4 spondylolisthesis.  Severe chronic left side cervical facet degeneration.  Progression of C6-C7 disc and endplate degeneration.   Original Report Authenticated  By: Erskine Speed, M.D.    Ct Head Wo Contrast  08/24/2012  *RADIOLOGY REPORT*  Clinical Data: Altered of consciousness, dizziness, unsteady gait, confusion  CT HEAD WITHOUT CONTRAST  Technique:  Contiguous axial images were obtained from the base of the skull through the vertex without contrast.  Comparison: 11/01/2001  Findings: Motion artifacts despite repeat imaging. Normal ventricular morphology. No midline shift or mass effect. Mild small vessel chronic ischemic changes of deep cerebral white matter. No intracranial hemorrhage, mass lesion, or evidence of acute infarction, within limitations imposed by motion. Bones and sinuses grossly unremarkable.  IMPRESSION: Mild small vessel chronic ischemic changes of deep cerebral white matter. No acute intracranial abnormalities.   Original Report Authenticated By: Ulyses Southward, M.D.    Mr Brain Wo Contrast  09/19/2012  *RADIOLOGY REPORT*  Clinical Data: Mental status changes.  Confusion.  Weakness. Speech disturbance.  Left-sided weakness.  MRI HEAD WITHOUT CONTRAST  Technique:  Multiplanar, multiecho pulse sequences of the brain and surrounding structures were obtained according to standard protocol without intravenous contrast.  Comparison: Head CT 08/24/2012  Findings: Diffusion imaging does not show any acute or subacute infarction.  The brainstem and cerebellum are normal.  The cerebral hemispheres show atrophy with moderate chronic small vessel changes affecting the deep and subcortical white matter.  No cortical or large vessel territory infarction.  No mass lesion, hemorrhage, hydrocephalus or extra-axial collection.  No pituitary mass.  No inflammatory sinus disease.  No skull or skull base lesion.  IMPRESSION: Moderate chronic appearing small vessel changes throughout the cerebral hemispheric white matter.  No acute or reversible process.   Original Report Authenticated By: Paulina Fusi, M.D.     Lab Results: Basic Metabolic Panel: No results found for  this basename: NA, K, CL, CO2, GLUCOSE, BUN, CREATININE, CALCIUM, MG, PHOS,  in the last 72 hours Liver Function Tests: No results found for this basename: AST, ALT, ALKPHOS, BILITOT, PROT, ALBUMIN,  in the last 72 hours   CBC: No results found for this basename: WBC, NEUTROABS, HGB, HCT, MCV, PLT,  in the last 72 hours  Recent Results (from the past 240 hour(s))  URINE CULTURE     Status: None   Collection Time    09/19/12  3:40 PM      Result Value Range Status   Specimen Description URINE, CLEAN CATCH   Final   Special Requests NONE   Final   Culture  Setup Time 09/19/2012 18:10   Final   Colony Count 6,000 COLONIES/ML   Final   Culture INSIGNIFICANT GROWTH   Final   Report Status 09/21/2012 FINAL   Final     Hospital Course: She has been having episodes of altered mental status for about 3 weeks. Some of this seems to be related to a diagnosis of herpes zoster and pain associated with that. She has been on an off multiple medications for pain. She's been to the emergency room to a dermatologist to my office on several occasions and I think the combination  of medications have caused her to have episodes of altered mental status. When she was seen in my office on the day of admission she was confused. She was brought to the hospital started on fluids had neurology consultation and he improved by the next morning. She was back essentially to baseline. Her medications were thought to be the problem were Lyrica in combination with oxycodone. She was told to stop both of those  Discharge Exam: Blood pressure 136/80, pulse 64, temperature 98.5 F (36.9 C), temperature source Oral, resp. rate 16, height 5\' 1"  (1.549 m), weight 49.5 kg (109 lb 2 oz), SpO2 97.00%. She is awake and alert. Her chest is clear. Her heart is regular.  Disposition: Home      Discharge Orders   Future Appointments Provider Department Dept Phone   01/14/2013 11:00 AM [facility]  [phone]   01/16/2013 1:30 PM Ellouise Newer, PA-C [facility] [phone]   Future Orders Complete By Expires     Discharge patient  As directed          Signed: Sarh Kirschenbaum L Pager [phone]  09/23/2012, 9:32 AM

## 2012-09-24 ENCOUNTER — Telehealth (HOSPITAL_COMMUNITY): Payer: Self-pay | Admitting: *Deleted

## 2012-09-24 NOTE — Telephone Encounter (Signed)
Pt was returning Sue's call [id number]. Continues to have pain in hand and elbow. She is seeing Dr. Hall(dermatologist) and he has her on prednisone and elavil.

## 2012-09-27 NOTE — Progress Notes (Signed)
UR Chart Review Completed  

## 2012-10-30 ENCOUNTER — Other Ambulatory Visit (HOSPITAL_COMMUNITY): Payer: Self-pay | Admitting: Oncology

## 2013-01-14 ENCOUNTER — Encounter (HOSPITAL_COMMUNITY): Payer: Medicare Other | Attending: Internal Medicine

## 2013-01-14 DIAGNOSIS — C9210 Chronic myeloid leukemia, BCR/ABL-positive, not having achieved remission: Secondary | ICD-10-CM

## 2013-01-14 LAB — COMPREHENSIVE METABOLIC PANEL
ALT: 18 U/L (ref 0–35)
CO2: 32 mEq/L (ref 19–32)
Calcium: 9.4 mg/dL (ref 8.4–10.5)
GFR calc Af Amer: 40 mL/min — ABNORMAL LOW (ref >90)
GFR calc non Af Amer: 35 mL/min — ABNORMAL LOW (ref >90)
Glucose, Bld: 95 mg/dL (ref 70–99)
Sodium: 142 mEq/L (ref 135–145)

## 2013-01-14 LAB — CBC WITH DIFFERENTIAL/PLATELET
Eosinophils Relative: 4 % (ref 0–5)
HCT: 34 % — ABNORMAL LOW (ref 36.0–46.0)
Hemoglobin: 11.2 g/dL — ABNORMAL LOW (ref 12.0–15.0)
Lymphocytes Relative: 22 % (ref 12–46)
Lymphs Abs: 0.9 10*3/uL (ref 0.7–4.0)
MCV: 101.8 fL — ABNORMAL HIGH (ref 78.0–100.0)
Monocytes Absolute: 0.4 10*3/uL (ref 0.1–1.0)
Monocytes Relative: 11 % (ref 3–12)
Platelets: 98 10*3/uL — ABNORMAL LOW (ref 150–400)
RBC: 3.34 MIL/uL — ABNORMAL LOW (ref 3.87–5.11)
WBC: 4.1 10*3/uL (ref 4.0–10.5)

## 2013-01-14 NOTE — Progress Notes (Signed)
Labs drawn today for cbc/diff,cmp,BCR/ABL

## 2013-01-16 ENCOUNTER — Encounter (HOSPITAL_COMMUNITY): Payer: Self-pay | Admitting: Oncology

## 2013-01-16 ENCOUNTER — Encounter (HOSPITAL_BASED_OUTPATIENT_CLINIC_OR_DEPARTMENT_OTHER): Payer: Medicare Other | Admitting: Oncology

## 2013-01-16 VITALS — BP 122/68 | HR 78 | Temp 98.4°F | Resp 18 | Wt 114.2 lb

## 2013-01-16 DIAGNOSIS — C9210 Chronic myeloid leukemia, BCR/ABL-positive, not having achieved remission: Secondary | ICD-10-CM

## 2013-01-16 NOTE — Progress Notes (Signed)
Ana Maudlin, MD [address] [place] [address]  CML (chronic myelocytic leukemia)  CURRENT THERAPY: Gleevac 400 mg daily  INTERVAL HISTORY: Ana Nelson 72 y.o. 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  She is tolerating therapy well.  I personally reviewed and went over laboratory results with the patient.  Blood cell count is within normal limits at 4.1, hemoglobin mildly anemic at 11.2 g/dL, and platelet count 47,829 which is stable. Her metabolic panel is impressive for a hypokalemia 3.1.  She admits that she had a migraine headache for quite a few days. She reports it lasted for 19 days. During this time, she ceased from taking her potassium chloride. I suspect this is the cause of her hypokalemia. Recommend that she restart her potassium recommend that she double the amount to 20 mEq daily for a few days and then back to her usual dose of 10 mEq daily.  I personally reviewed and went over radiographic studies with the patient.   MRI of brain in March of 2014 was negative for any acute abnormalities. She is overdue for her screening mammogram which was last performed in February of 2013. She was given a family reminder that she is overdue for this and she admits that she will get this performed sooner rather than later.  She denies any complaints and ROS questioning is negative.  Her husband who accompanies her today, is very upset to Dr. Mariel Sleet has left the clinic. He expresses his feelings with me which are negative cores Dr. Thornton Papas character and I listened his complaints and did not comment.  Past Medical History  Diagnosis Date  . Osteoarthritis   . CML (chronic myelocytic leukemia)   . History of migraine headaches   . Right knee pain   . Injury of left shoulder   . Blindness of right eye   .  Drooping eyelid     left eye  . 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 Shingles     has CML (chronic myelocytic leukemia) on her problem list.     is allergic to nubain; perphenazine; ibuprofen; tylenol; and vistaril.  [name] does not currently have medications on file.  Past Surgical History  Procedure Laterality Date  . Bowel blockage surgery    . Right kidney removed  1980  . Bone marrow bx & asp    . Rt. cataract surgery      Denies any headaches, dizziness, double vision, fevers, chills, night sweats, nausea, vomiting, diarrhea, constipation, chest pain, heart palpitations, shortness of breath, blood in stool, black tarry stool, urinary pain, urinary burning, urinary frequency, hematuria.   PHYSICAL EXAMINATION  ECOG PERFORMANCE STATUS: 0 - Asymptomatic  Filed Vitals:   01/16/13 1343  BP: 122/68  Pulse: 78  Temp: 98.4 F (36.9 C)  Resp: 18    GENERAL:alert, no distress, well nourished, well developed, comfortable, cooperative and smiling SKIN: skin color, texture, turgor are normal, no rashes or significant lesions HEAD: Normocephalic, No masses, lesions, tenderness or abnormalities EYES: normal, PERRLA, EOMI, Conjunctiva are pink and non-injected EARS: External ears normal OROPHARYNX:mucous membranes are moist  NECK: supple, no adenopathy, thyroid normal size, non-tender, without nodularity, no stridor, non-tender, trachea midline LYMPH:  no palpable lymphadenopathy, no hepatosplenomegaly BREAST:not examined LUNGS: clear to auscultation and percussion HEART: regular rate & rhythm, no murmurs, no gallops,  S1 normal and S2 normal ABDOMEN:abdomen soft, non-tender, normal bowel sounds, no masses or organomegaly and no hepatosplenomegaly BACK: Back symmetric, no curvature., No CVA tenderness EXTREMITIES:less then 2 second capillary refill, no joint deformities, effusion, or inflammation, no edema, no skin discoloration, no clubbing, no cyanosis  NEURO:  alert & oriented x 3 with fluent speech, no focal motor/sensory deficits, gait normal   LABORATORY DATA: CBC    Component Value Date/Time   WBC 4.1 01/14/2013 1052   RBC 3.34* 01/14/2013 1052   HGB 11.2* 01/14/2013 1052   HCT 34.0* 01/14/2013 1052   PLT 98* 01/14/2013 1052   MCV 101.8* 01/14/2013 1052   MCH 33.5 01/14/2013 1052   MCHC 32.9 01/14/2013 1052   RDW 15.1 01/14/2013 1052   LYMPHSABS 0.9 01/14/2013 1052   MONOABS 0.4 01/14/2013 1052   EOSABS 0.2 01/14/2013 1052   BASOSABS 0.0 01/14/2013 1052      Chemistry      Component Value Date/Time   NA 142 01/14/2013 1052   K 3.1* 01/14/2013 1052   CL 104 01/14/2013 1052   CO2 32 01/14/2013 1052   BUN 12 01/14/2013 1052   CREATININE 1.46* 01/14/2013 1052      Component Value Date/Time   CALCIUM 9.4 01/14/2013 1052   ALKPHOS 74 01/14/2013 1052   AST 34 01/14/2013 1052   ALT 18 01/14/2013 1052   BILITOT 0.3 01/14/2013 1052        PENDING LABS: BCR/ABL   RADIOGRAPHIC STUDIES:  09/19/2012  *RADIOLOGY REPORT*  Clinical Data: Mental status changes. Confusion. Weakness.  Speech disturbance. Left-sided weakness.  MRI HEAD WITHOUT CONTRAST  Technique: Multiplanar, multiecho pulse sequences of the brain and  surrounding structures were obtained according to standard protocol  without intravenous contrast.  Comparison: Head CT 08/24/2012  Findings: Diffusion imaging does not show any acute or subacute  infarction. The brainstem and cerebellum are normal. The cerebral  hemispheres show atrophy with moderate chronic small vessel changes  affecting the deep and subcortical white matter. No cortical or  large vessel territory infarction. No mass lesion, hemorrhage,  hydrocephalus or extra-axial collection. No pituitary mass. No  inflammatory sinus disease. No skull or skull base lesion.  IMPRESSION:  Moderate chronic appearing small vessel changes throughout the  cerebral hemispheric white matter. No acute or reversible process.  Original Report Authenticated By:  Paulina Fusi, M.D.     ASSESSMENT:  1. 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 2. Renal sufficiency which is getting slightly worse  3. Migraine headaches  4. Depression and anxiety intermittently.   PLAN:  1. I personally reviewed and went over laboratory results with the patient. 2. I personally reviewed and went over radiographic studies with the patient. 3. Screening mammogram is overdue and was to be performed in Feb 2014.  Patient reminded to get this scheduled.  4. BCR/ABL is pending 5. Labs in 3 months: CBC diff, CMET 6. Labs in 6 months: CBC diff, CMET, BCR/ABL 7. Return in 6 months for follow-up.   THERAPY PLAN:  Patient is tolerating therapy well and her last BCR/ABL was negative. Her most recent one is still pending. We'll continue with current therapy consisting of Gleevec. We'll continue with surveillance via a laboratory work every 3 months with a BCR/ABL every 6 months.  All questions were answered. The patient knows to call the clinic with any problems, questions or concerns. We can certainly see the patient much sooner if  necessary.  Patient and plan discussed with Dr. Gerarda Fraction and he is in agreement with the aforementioned.  Fatimah Sundquist

## 2013-01-16 NOTE — Patient Instructions (Addendum)
[facility] Discharge Instructions  RECOMMENDATIONS MADE BY THE CONSULTANT AND ANY TEST RESULTS WILL BE SENT TO YOUR REFERRING PHYSICIAN.  EXAM FINDINGS BY THE PHYSICIAN TODAY AND SIGNS OR SYMPTOMS TO REPORT TO CLINIC OR PRIMARY PHYSICIAN: Exam and discussion by PA.    MEDICATIONS PRESCRIBED:  Restart your potassium with 2 daily for 1 week then back to 1 daily.  INSTRUCTIONS GIVEN AND DISCUSSED: Report fevers, chills, recurring infections, etc.  SPECIAL INSTRUCTIONS/FOLLOW-UP: Blood work every 3 months and follow-up in 6 months.  Thank you for choosing [facility] to provide your oncology and hematology care.  To afford each patient quality time with our providers, please arrive at least 15 minutes before your scheduled appointment time.  With your help, our goal is to use those 15 minutes to complete the necessary work-up to ensure our physicians have the information they need to help with your evaluation and healthcare recommendations.    Effective January 1st, 2014, we ask that you re-schedule your appointment with our physicians should you arrive 10 or more minutes late for your appointment.  We strive to give you quality time with our providers, and arriving late affects you and other patients whose appointments are after yours.    Again, thank you for choosing [facility].  Our hope is that these requests will decrease the amount of time that you wait before being seen by our physicians.       _____________________________________________________________  Should you have questions after your visit to [facility], please contact our office at [phone] between the hours of 8:30 a.m. and 5:00 p.m.  Voicemails left after 4:30 p.m. will not be returned until the following business day.  For prescription refill requests, have your pharmacy contact our office with your prescription refill request.

## 2013-01-18 ENCOUNTER — Telehealth (HOSPITAL_COMMUNITY): Payer: Self-pay | Admitting: *Deleted

## 2013-01-22 ENCOUNTER — Other Ambulatory Visit (HOSPITAL_BASED_OUTPATIENT_CLINIC_OR_DEPARTMENT_OTHER): Payer: Self-pay | Admitting: Pulmonary Disease

## 2013-01-22 ENCOUNTER — Other Ambulatory Visit (HOSPITAL_COMMUNITY): Payer: Self-pay | Admitting: Pulmonary Disease

## 2013-01-22 DIAGNOSIS — Z139 Encounter for screening, unspecified: Secondary | ICD-10-CM

## 2013-01-29 ENCOUNTER — Ambulatory Visit (HOSPITAL_COMMUNITY)
Admission: RE | Admit: 2013-01-29 | Discharge: 2013-01-29 | Disposition: A | Discharge status: Home / Self Care | Payer: Medicare Other | Source: Ambulatory Visit | Attending: Pulmonary Disease | Admitting: Pulmonary Disease

## 2013-01-29 DIAGNOSIS — Z139 Encounter for screening, unspecified: Secondary | ICD-10-CM

## 2013-01-29 DIAGNOSIS — Z1231 Encounter for screening mammogram for malignant neoplasm of breast: Secondary | ICD-10-CM | POA: Insufficient documentation

## 2013-04-10 ENCOUNTER — Other Ambulatory Visit (HOSPITAL_COMMUNITY): Payer: Medicare Other

## 2013-04-15 ENCOUNTER — Other Ambulatory Visit (HOSPITAL_COMMUNITY): Payer: Self-pay | Admitting: Oncology

## 2013-04-26 ENCOUNTER — Ambulatory Visit: Payer: Self-pay | Admitting: Obstetrics & Gynecology

## 2013-04-29 ENCOUNTER — Ambulatory Visit: Payer: Self-pay | Admitting: Obstetrics & Gynecology

## 2013-04-30 ENCOUNTER — Other Ambulatory Visit (HOSPITAL_COMMUNITY): Payer: Medicare Other

## 2013-05-01 ENCOUNTER — Other Ambulatory Visit (HOSPITAL_COMMUNITY): Payer: Self-pay | Admitting: Pulmonary Disease

## 2013-05-01 DIAGNOSIS — R102 Pelvic and perineal pain: Secondary | ICD-10-CM

## 2013-05-03 ENCOUNTER — Encounter (HOSPITAL_COMMUNITY): Payer: Medicare Other | Attending: Hematology and Oncology

## 2013-05-03 DIAGNOSIS — C9210 Chronic myeloid leukemia, BCR/ABL-positive, not having achieved remission: Secondary | ICD-10-CM

## 2013-05-03 LAB — CBC WITH DIFFERENTIAL/PLATELET
Basophils Absolute: 0 10*3/uL (ref 0.0–0.1)
Basophils Relative: 1 % (ref 0–1)
Hemoglobin: 11.6 g/dL — ABNORMAL LOW (ref 12.0–15.0)
MCHC: 32.2 g/dL (ref 30.0–36.0)
Monocytes Relative: 8 % (ref 3–12)
Neutro Abs: 1.8 10*3/uL (ref 1.7–7.7)
Neutrophils Relative %: 49 % (ref 43–77)
RDW: 15 % (ref 11.5–15.5)

## 2013-05-03 LAB — COMPREHENSIVE METABOLIC PANEL
AST: 39 U/L — ABNORMAL HIGH (ref 0–37)
Albumin: 3.8 g/dL (ref 3.5–5.2)
Alkaline Phosphatase: 70 U/L (ref 39–117)
Chloride: 107 mEq/L (ref 96–112)
Potassium: 3.9 mEq/L (ref 3.5–5.1)
Sodium: 143 mEq/L (ref 135–145)
Total Bilirubin: 0.2 mg/dL — ABNORMAL LOW (ref 0.3–1.2)

## 2013-05-03 NOTE — Progress Notes (Signed)
Labs drawn today for cbc/diff,cmp 

## 2013-05-06 ENCOUNTER — Ambulatory Visit (HOSPITAL_COMMUNITY): Payer: Medicare Other

## 2013-05-06 ENCOUNTER — Ambulatory Visit (HOSPITAL_COMMUNITY): Admission: RE | Admit: 2013-05-06 | Payer: Medicare Other | Source: Ambulatory Visit

## 2013-05-09 ENCOUNTER — Ambulatory Visit (HOSPITAL_COMMUNITY): Payer: Medicare Other

## 2013-05-22 ENCOUNTER — Other Ambulatory Visit (HOSPITAL_COMMUNITY): Payer: Self-pay | Admitting: Orthopedic Surgery

## 2013-05-22 DIAGNOSIS — M898X1 Other specified disorders of bone, shoulder: Secondary | ICD-10-CM

## 2013-05-23 ENCOUNTER — Ambulatory Visit (HOSPITAL_COMMUNITY)
Admission: RE | Admit: 2013-05-23 | Discharge: 2013-05-23 | Disposition: A | Discharge status: Home / Self Care | Payer: Medicare Other | Source: Ambulatory Visit | Attending: Orthopedic Surgery | Admitting: Orthopedic Surgery

## 2013-05-23 DIAGNOSIS — C9591 Leukemia, unspecified, in remission: Secondary | ICD-10-CM | POA: Insufficient documentation

## 2013-05-23 DIAGNOSIS — M19019 Primary osteoarthritis, unspecified shoulder: Secondary | ICD-10-CM | POA: Insufficient documentation

## 2013-05-23 DIAGNOSIS — M898X1 Other specified disorders of bone, shoulder: Secondary | ICD-10-CM

## 2013-05-23 DIAGNOSIS — M25519 Pain in unspecified shoulder: Secondary | ICD-10-CM | POA: Insufficient documentation

## 2013-05-23 DIAGNOSIS — M67919 Unspecified disorder of synovium and tendon, unspecified shoulder: Secondary | ICD-10-CM | POA: Insufficient documentation

## 2013-05-23 DIAGNOSIS — M25419 Effusion, unspecified shoulder: Secondary | ICD-10-CM | POA: Insufficient documentation

## 2013-05-23 DIAGNOSIS — M719 Bursopathy, unspecified: Secondary | ICD-10-CM | POA: Insufficient documentation

## 2013-07-08 ENCOUNTER — Other Ambulatory Visit (HOSPITAL_COMMUNITY): Payer: Medicare Other

## 2013-07-15 ENCOUNTER — Ambulatory Visit (HOSPITAL_COMMUNITY): Payer: Medicare Other

## 2013-08-26 ENCOUNTER — Other Ambulatory Visit (HOSPITAL_COMMUNITY): Payer: Medicare Other

## 2013-08-26 ENCOUNTER — Encounter (HOSPITAL_COMMUNITY): Payer: Medicare Other | Attending: Oncology

## 2013-08-26 DIAGNOSIS — E1110 Type 2 diabetes mellitus with ketoacidosis without coma: Secondary | ICD-10-CM | POA: Insufficient documentation

## 2013-08-26 DIAGNOSIS — C9210 Chronic myeloid leukemia, BCR/ABL-positive, not having achieved remission: Secondary | ICD-10-CM

## 2013-08-26 LAB — COMPREHENSIVE METABOLIC PANEL
ALT: 14 U/L (ref 0–35)
AST: 35 U/L (ref 0–37)
Albumin: 3.8 g/dL (ref 3.5–5.2)
Alkaline Phosphatase: 70 U/L (ref 39–117)
BUN: 20 mg/dL (ref 6–23)
CALCIUM: 9.3 mg/dL (ref 8.4–10.5)
CO2: 27 mEq/L (ref 19–32)
Chloride: 109 mEq/L (ref 96–112)
Creatinine, Ser: 1.8 mg/dL — ABNORMAL HIGH (ref 0.50–1.10)
GFR calc non Af Amer: 27 mL/min — ABNORMAL LOW (ref >90)
GFR, EST AFRICAN AMERICAN: 31 mL/min — AB (ref >90)
GLUCOSE: 91 mg/dL (ref 70–99)
Potassium: 5 mEq/L (ref 3.7–5.3)
Sodium: 145 mEq/L (ref 137–147)
TOTAL PROTEIN: 6.6 g/dL (ref 6.0–8.3)
Total Bilirubin: 0.3 mg/dL (ref 0.3–1.2)

## 2013-08-26 LAB — CBC WITH DIFFERENTIAL/PLATELET
BASOS ABS: 0 10*3/uL (ref 0.0–0.1)
Basophils Relative: 1 % (ref 0–1)
EOS ABS: 0.2 10*3/uL (ref 0.0–0.7)
EOS PCT: 4 % (ref 0–5)
HCT: 34.6 % — ABNORMAL LOW (ref 36.0–46.0)
Hemoglobin: 11.4 g/dL — ABNORMAL LOW (ref 12.0–15.0)
Lymphocytes Relative: 24 % (ref 12–46)
Lymphs Abs: 1 10*3/uL (ref 0.7–4.0)
MCH: 34.1 pg — AB (ref 26.0–34.0)
MCHC: 32.9 g/dL (ref 30.0–36.0)
MCV: 103.6 fL — AB (ref 78.0–100.0)
Monocytes Absolute: 0.3 10*3/uL (ref 0.1–1.0)
Monocytes Relative: 8 % (ref 3–12)
Neutro Abs: 2.7 10*3/uL (ref 1.7–7.7)
Neutrophils Relative %: 64 % (ref 43–77)
PLATELETS: 103 10*3/uL — AB (ref 150–400)
RBC: 3.34 MIL/uL — ABNORMAL LOW (ref 3.87–5.11)
RDW: 16.7 % — AB (ref 11.5–15.5)
WBC: 4.2 10*3/uL (ref 4.0–10.5)

## 2013-08-26 NOTE — Progress Notes (Signed)
Labs drawn today for cbc/diff,cmp,bcr/abl

## 2013-08-27 ENCOUNTER — Other Ambulatory Visit (HOSPITAL_COMMUNITY): Payer: Medicare Other

## 2013-08-28 LAB — BCR/ABL GENE REARRANGEMENT QNT, PCR
BCR ABL1 / ABL1 IS: 0 %
BCR ABL1 / ABL1: 0 %

## 2013-08-28 LAB — P190 BCR-ABL 1: P190 BCR ABL1: NOT DETECTED

## 2013-08-28 LAB — P210 BCR-ABL 1: P210 BCR ABL1: NOT DETECTED

## 2013-09-01 NOTE — Progress Notes (Signed)
-  Rescheduled due to inclement weather-  Ana Nelson   

## 2013-09-02 ENCOUNTER — Ambulatory Visit (HOSPITAL_COMMUNITY): Payer: Medicare Other | Admitting: Oncology

## 2013-09-23 ENCOUNTER — Encounter: Payer: Self-pay | Admitting: Obstetrics & Gynecology

## 2013-09-23 ENCOUNTER — Encounter (INDEPENDENT_AMBULATORY_CARE_PROVIDER_SITE_OTHER): Payer: Self-pay

## 2013-09-23 ENCOUNTER — Ambulatory Visit (INDEPENDENT_AMBULATORY_CARE_PROVIDER_SITE_OTHER): Payer: Medicare Other | Admitting: Obstetrics & Gynecology

## 2013-09-23 VITALS — BP 160/90 | Ht 61.2 in | Wt 118.0 lb

## 2013-09-23 DIAGNOSIS — N760 Acute vaginitis: Secondary | ICD-10-CM

## 2013-09-23 MED ORDER — METRONIDAZOLE 500 MG PO TABS: 500.0000 mg | ORAL_TABLET | Freq: Two times a day (BID) | ORAL | Status: DC

## 2013-09-23 NOTE — Progress Notes (Signed)
Patient ID: Ana Nelson, female   DOB: [birth year], 73 y.o.   MRN: [id number] Pt here primarily for a pelvic exam, been 4 years Has had increasing back pian Some discharge No bleeding  No other specific issues  Vulva is normal without lesions Vagina is pink moist with slight  discharge Cervix normal in appearance and pap is normal Uterus is normal Adnexa is negative Back exam negative   Menstrual History: Menarche age: 44  No LMP recorded. Patient is postmenopausal.    Past Medical History  Diagnosis Date  . Osteoarthritis   . CML (chronic myelocytic leukemia)   . History of migraine headaches   . Right knee pain   . Injury of left shoulder   . Blindness of right eye   . Drooping eyelid     left eye  . 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 Shingles     Past Surgical History  Procedure Laterality Date  . Bowel blockage surgery    . Right kidney removed  1980  . Bone marrow bx & asp    . Rt. cataract surgery      Family History  Problem Relation Age of Onset  . Cancer Maternal Uncle     bone  . Cancer Paternal Aunt     uterine  . Cancer Maternal Grandmother     breast  . Cancer Maternal Grandfather     throat  . Cancer Paternal Grandmother     leukemia  . Cancer Son     colon    Social History:  reports that she has never smoked. She has never used smokeless tobacco. She reports that she does not drink alcohol or use illicit drugs.  Allergies:  Allergies  Allergen Reactions  . Nubain [Nalbuphine Hcl] Swelling  . Perphenazine     seizures  . Ibuprofen   . Tylenol [Acetaminophen]   . Vistaril [Hydroxyzine Hcl] Other (See Comments)    Made my tongue swell     (Not in a hospital admission)  ROS  Blood pressure 160/90, height 5' 1.2" (1.554 m), weight 118 lb (53.524 kg). Physical Exam  No results found for this or any previous visit (from the past 24 hour(s)).  No results found.  Assessment/Plan: Normal gyn  exam  Metronidazole 500 bid x 7days  EURE,LUTHER H 09/23/2013, 12:07 PM   Past Medical History  Diagnosis Date  . Osteoarthritis   . CML (chronic myelocytic leukemia)   . History of migraine headaches   . Right knee pain   . Injury of left shoulder   . Blindness of right eye   . Drooping eyelid     left eye  . 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 Shingles     Past Surgical History  Procedure Laterality Date  . Bowel blockage surgery    . Right kidney removed  1980  . Bone marrow bx & asp    . Rt. cataract surgery      OB History   Grav Para Term Preterm Abortions TAB SAB Ect Mult Living                  Allergies  Allergen Reactions  . Nubain [Nalbuphine Hcl] Swelling  . Perphenazine     seizures  . Ibuprofen   . Tylenol [Acetaminophen]   . Vistaril [Hydroxyzine Hcl] Other (See Comments)    Made my tongue swell    History   Social History  . Marital Status: Married  Spouse Name: N/A    Number of Children: N/A  . Years of Education: N/A   Social History Main Topics  . Smoking status: Never Smoker   . Smokeless tobacco: Never Used  . Alcohol Use: No  . Drug Use: No  . Sexual Activity: No   Other Topics Concern  . None   Social History Narrative  . None    Family History  Problem Relation Age of Onset  . Cancer Maternal Uncle     bone  . Cancer Paternal Aunt     uterine  . Cancer Maternal Grandmother     breast  . Cancer Maternal Grandfather     throat  . Cancer Paternal Grandmother     leukemia  . Cancer Son     colon

## 2013-09-24 ENCOUNTER — Ambulatory Visit (HOSPITAL_COMMUNITY): Payer: Medicare Other | Admitting: Oncology

## 2013-09-26 NOTE — Progress Notes (Signed)
Ana Bogus, MD [place] [address]  CML (chronic myelocytic leukemia)  CURRENT THERAPY:Gleevac 400 mg daily  INTERVAL HISTORY: Ana Nelson 73 y.o. 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  She is tolerating therapy well.  I personally reviewed and went over laboratory results with the patient.  The results are noted within this dictation.  I personally reviewed and went over radiographic studies with the patient.  The results are noted within this dictation.  Screening mammogram on 02/01/2013 was BIRADS 1 and she is due for her next screening mammogram in July 2015.  Ana Nelson is doing well.  She continues to follow an Research officer, trade union and she plans of seeing him at the [employer] on August 1.  Her husband had a Zoster outbreak.  The lesions/rash has healed and she is educated that he is no longer contagious.  Her husband remained at home today and did not come with her today to her appointment which is appropriate.  He is having post-herpetic neuralgia.  He is on Hydrocodone, and Lyrica for this.  She is very concerned about his pain and it bothers her that he is in so much pain.  Unfortunately, she understands his pain as she had an outbreak years also leaving her with left hand/wrist chronic loss of feeling.  From a hematologic standpoint she is very stable.  She remains in a biochemical remission with a negative BCR/ABL.     Past Medical History  Diagnosis Date  . Osteoarthritis   . CML (chronic myelocytic leukemia)   . History of migraine headaches   . Right knee pain   . Injury of left shoulder   . Blindness of right eye   . Drooping eyelid     left eye  . 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 Shingles     has CML (chronic myelocytic leukemia) on her problem list.     is allergic to nubain; perphenazine; ibuprofen; tylenol; and vistaril.  Ana Nelson does not currently have medications on file.  Past Surgical History  Procedure Laterality Date  . Bowel blockage surgery    . Right kidney removed  1980  . Bone marrow bx & asp    . Rt. cataract surgery      Denies any headaches, dizziness, double vision, fevers, chills, night sweats, nausea, vomiting, diarrhea, constipation, chest pain, heart palpitations, shortness of breath, blood in stool, black tarry stool, urinary pain, urinary burning, urinary frequency, hematuria.   PHYSICAL EXAMINATION  ECOG PERFORMANCE STATUS: 0 - Asymptomatic  Filed Vitals:   09/27/13 1300  BP: 164/71  Pulse: 60  Temp: 98.6 F (37 C)  Resp: 20    GENERAL:alert, no distress, well nourished, well developed, comfortable, cooperative and smiling SKIN: skin color, texture, turgor are normal, no rashes or significant lesions HEAD: Normocephalic, No masses, lesions, tenderness or abnormalities EYES: normal, PERRLA, EOMI, Conjunctiva are pink and non-injected EARS: External ears normal OROPHARYNX:lips, buccal mucosa, and tongue normal and mucous membranes are moist  NECK: supple, no adenopathy, thyroid normal size, non-tender, without nodularity, no stridor, non-tender, trachea midline LYMPH:  no palpable lymphadenopathy, no hepatosplenomegaly BREAST:not examined LUNGS: clear to auscultation and percussion HEART: regular rate & rhythm, no murmurs, no gallops, S1 normal and S2 normal ABDOMEN:abdomen soft, non-tender, normal bowel sounds, no masses or organomegaly and no hepatosplenomegaly BACK: Back  symmetric, no curvature. EXTREMITIES:less then 2 second capillary refill, no edema, no skin discoloration, no clubbing, no cyanosis  NEURO: alert & oriented x 3 with fluent speech, no focal motor/sensory deficits   LABORATORY DATA: CBC    Component Value Date/Time   WBC 4.2 08/26/2013 1135   RBC 3.34* 08/26/2013 1135    HGB 11.4* 08/26/2013 1135   HCT 34.6* 08/26/2013 1135   PLT 103* 08/26/2013 1135   MCV 103.6* 08/26/2013 1135   MCH 34.1* 08/26/2013 1135   MCHC 32.9 08/26/2013 1135   RDW 16.7* 08/26/2013 1135   LYMPHSABS 1.0 08/26/2013 1135   MONOABS 0.3 08/26/2013 1135   EOSABS 0.2 08/26/2013 1135   BASOSABS 0.0 08/26/2013 1135      Chemistry      Component Value Date/Time   NA 145 08/26/2013 1135   K 5.0 08/26/2013 1135   CL 109 08/26/2013 1135   CO2 27 08/26/2013 1135   BUN 20 08/26/2013 1135   CREATININE 1.80* 08/26/2013 1135      Component Value Date/Time   CALCIUM 9.3 08/26/2013 1135   ALKPHOS 70 08/26/2013 1135   AST 35 08/26/2013 1135   ALT 14 08/26/2013 1135   BILITOT 0.3 08/26/2013 1135     Results for Ana Nelson, Ana Nelson (MRN [id number]) as of 09/01/2013 20:01  Ref. Range 08/26/2013 11:35  BCR ABL1 / ABL1 No range found 0.000  BCR ABL1 / ABL1 IS No range found 0.000  P190 BCR ABL1 No range found Not Detected  P210 BCR ABL1 No range found Not Detected  Interpretation - BCRQ No range found REPORT     RADIOGRAPHIC STUDIES:  02/01/2013  *RADIOLOGY REPORT*  Clinical Data: Screening.  DIGITAL SCREENING BILATERAL MAMMOGRAM WITH CAD  Comparison: Previous exam(s).  FINDINGS:  ACR Breast Density Category c: The breast tissue is  heterogeneously dense, which may obscure small masses.  There are no findings suspicious for malignancy.  Images were processed with CAD.  IMPRESSION:  No mammographic evidence of malignancy.  A result letter of this screening mammogram will be mailed directly  to the patient.  RECOMMENDATION:  Screening mammogram in one year. (Code:SM-B-01Y)  BI-RADS CATEGORY 1: Negative.  Original Report Authenticated By: Lillia Mountain, M.D.    ASSESSMENT:  1. [place] chromosome positive, presenting here in September 2000 still in a complete cytogenetic remission on Gleevec.  2. Renal sufficiency 3. Migraine headaches  4. Depression and anxiety intermittently.  Patient  Active Problem List   Diagnosis Date Noted  . CML (chronic myelocytic leukemia) 05/04/2012    PLAN:  1. I personally reviewed and went over laboratory results with the patient.  The results are noted within this dictation. 2. I personally reviewed and went over radiographic studies with the patient.  The results are noted within this dictation.   3. Screening mammogram will be due in July 2015 4. Labs in 2 months: CBC diff, CMET 5. Labs in 5 months: CBC diff, CMET, BCR/ABL 6. Return in 5 months for follow-up   THERAPY PLAN:  Patient is tolerating therapy well and her last BCR/ABL was negative. We'll continue with current therapy consisting of Gleevec. We'll continue with surveillance via a laboratory work every 3 months with a BCR/ABL every 6 months.   All questions were answered. The patient knows to call the clinic with any problems, questions or concerns. We can certainly see the patient much sooner if necessary.  Patient and plan discussed with Dr. Farrel Gobble and he is in agreement with  the aforementioned.   KEFALAS,THOMAS 09/27/2013

## 2013-09-27 ENCOUNTER — Encounter (HOSPITAL_COMMUNITY): Payer: Self-pay | Admitting: Oncology

## 2013-09-27 ENCOUNTER — Encounter (HOSPITAL_COMMUNITY): Payer: Medicare Other | Attending: Oncology | Admitting: Oncology

## 2013-09-27 VITALS — BP 164/71 | HR 60 | Temp 98.6°F | Resp 20 | Wt 119.3 lb

## 2013-09-27 DIAGNOSIS — E1110 Type 2 diabetes mellitus with ketoacidosis without coma: Secondary | ICD-10-CM | POA: Insufficient documentation

## 2013-09-27 DIAGNOSIS — C9210 Chronic myeloid leukemia, BCR/ABL-positive, not having achieved remission: Secondary | ICD-10-CM

## 2013-09-27 DIAGNOSIS — N289 Disorder of kidney and ureter, unspecified: Secondary | ICD-10-CM

## 2013-09-27 NOTE — Patient Instructions (Signed)
[facility] Discharge Instructions  RECOMMENDATIONS MADE BY THE CONSULTANT AND ANY TEST RESULTS WILL BE SENT TO YOUR REFERRING PHYSICIAN.  We will see you in 2 months for labs. Return in 5 months for labs and doctor visit.    Thank you for choosing [facility] to provide your oncology and hematology care.  To afford each patient quality time with our providers, please arrive at least 15 minutes before your scheduled appointment time.  With your help, our goal is to use those 15 minutes to complete the necessary work-up to ensure our physicians have the information they need to help with your evaluation and healthcare recommendations.    Effective January 1st, 2014, we ask that you re-schedule your appointment with our physicians should you arrive 10 or more minutes late for your appointment.  We strive to give you quality time with our providers, and arriving late affects you and other patients whose appointments are after yours.    Again, thank you for choosing [facility].  Our hope is that these requests will decrease the amount of time that you wait before being seen by our physicians.       _____________________________________________________________  Should you have questions after your visit to [facility], please contact our office at [phone] between the hours of 8:30 a.m. and 5:00 p.m.  Voicemails left after 4:30 p.m. will not be returned until the following business day.  For prescription refill requests, have your pharmacy contact our office with your prescription refill request.

## 2013-10-01 ENCOUNTER — Ambulatory Visit (HOSPITAL_COMMUNITY)
Admission: RE | Admit: 2013-10-01 | Discharge: 2013-10-01 | Disposition: A | Discharge status: Home / Self Care | Payer: Medicare Other | Source: Ambulatory Visit | Attending: Pulmonary Disease | Admitting: Pulmonary Disease

## 2013-10-01 ENCOUNTER — Other Ambulatory Visit (HOSPITAL_COMMUNITY): Payer: Self-pay | Admitting: Pulmonary Disease

## 2013-10-01 DIAGNOSIS — M545 Low back pain, unspecified: Secondary | ICD-10-CM | POA: Insufficient documentation

## 2013-10-01 DIAGNOSIS — M51379 Other intervertebral disc degeneration, lumbosacral region without mention of lumbar back pain or lower extremity pain: Secondary | ICD-10-CM | POA: Insufficient documentation

## 2013-10-01 DIAGNOSIS — M5137 Other intervertebral disc degeneration, lumbosacral region: Secondary | ICD-10-CM | POA: Insufficient documentation

## 2013-10-01 DIAGNOSIS — M549 Dorsalgia, unspecified: Secondary | ICD-10-CM

## 2013-10-12 ENCOUNTER — Other Ambulatory Visit (HOSPITAL_COMMUNITY): Payer: Self-pay | Admitting: Oncology

## 2013-11-14 ENCOUNTER — Other Ambulatory Visit (HOSPITAL_COMMUNITY): Payer: Self-pay

## 2013-11-27 ENCOUNTER — Encounter (HOSPITAL_COMMUNITY): Payer: Medicare Other | Attending: Oncology

## 2013-11-27 ENCOUNTER — Other Ambulatory Visit (HOSPITAL_COMMUNITY): Payer: Medicare Other

## 2013-11-27 DIAGNOSIS — C9210 Chronic myeloid leukemia, BCR/ABL-positive, not having achieved remission: Secondary | ICD-10-CM

## 2013-11-27 LAB — CBC WITH DIFFERENTIAL/PLATELET
BASOS ABS: 0 10*3/uL (ref 0.0–0.1)
BASOS PCT: 1 % (ref 0–1)
EOS PCT: 3 % (ref 0–5)
Eosinophils Absolute: 0.1 10*3/uL (ref 0.0–0.7)
HCT: 35.4 % — ABNORMAL LOW (ref 36.0–46.0)
Hemoglobin: 11.4 g/dL — ABNORMAL LOW (ref 12.0–15.0)
LYMPHS ABS: 1 10*3/uL (ref 0.7–4.0)
Lymphocytes Relative: 29 % (ref 12–46)
MCH: 32.5 pg (ref 26.0–34.0)
MCHC: 32.2 g/dL (ref 30.0–36.0)
MCV: 100.9 fL — ABNORMAL HIGH (ref 78.0–100.0)
Monocytes Absolute: 0.3 10*3/uL (ref 0.1–1.0)
Monocytes Relative: 8 % (ref 3–12)
NEUTROS PCT: 60 % (ref 43–77)
Neutro Abs: 2.1 10*3/uL (ref 1.7–7.7)
PLATELETS: 112 10*3/uL — AB (ref 150–400)
RBC: 3.51 MIL/uL — AB (ref 3.87–5.11)
RDW: 15.8 % — AB (ref 11.5–15.5)
WBC: 3.6 10*3/uL — ABNORMAL LOW (ref 4.0–10.5)

## 2013-11-27 LAB — COMPREHENSIVE METABOLIC PANEL
ALBUMIN: 3.8 g/dL (ref 3.5–5.2)
ALK PHOS: 66 U/L (ref 39–117)
ALT: 18 U/L (ref 0–35)
AST: 38 U/L — AB (ref 0–37)
BUN: 20 mg/dL (ref 6–23)
CO2: 25 mEq/L (ref 19–32)
Calcium: 9.1 mg/dL (ref 8.4–10.5)
Chloride: 107 mEq/L (ref 96–112)
Creatinine, Ser: 1.6 mg/dL — ABNORMAL HIGH (ref 0.50–1.10)
GFR calc Af Amer: 36 mL/min — ABNORMAL LOW (ref >90)
GFR calc non Af Amer: 31 mL/min — ABNORMAL LOW (ref >90)
Glucose, Bld: 128 mg/dL — ABNORMAL HIGH (ref 70–99)
POTASSIUM: 4.4 meq/L (ref 3.7–5.3)
SODIUM: 142 meq/L (ref 137–147)
Total Bilirubin: 0.3 mg/dL (ref 0.3–1.2)
Total Protein: 6.2 g/dL (ref 6.0–8.3)

## 2013-12-06 NOTE — Progress Notes (Signed)
Labs drawn

## 2014-01-07 ENCOUNTER — Other Ambulatory Visit (HOSPITAL_COMMUNITY): Payer: Self-pay | Admitting: Orthopedic Surgery

## 2014-01-07 DIAGNOSIS — R52 Pain, unspecified: Secondary | ICD-10-CM

## 2014-01-13 ENCOUNTER — Ambulatory Visit (HOSPITAL_COMMUNITY): Payer: Medicare Other

## 2014-01-14 ENCOUNTER — Other Ambulatory Visit (HOSPITAL_COMMUNITY): Payer: Medicare Other

## 2014-01-14 ENCOUNTER — Ambulatory Visit (HOSPITAL_COMMUNITY)
Admission: RE | Admit: 2014-01-14 | Discharge: 2014-01-14 | Disposition: A | Discharge status: Home / Self Care | Payer: Medicare Other | Source: Ambulatory Visit | Attending: Orthopedic Surgery | Admitting: Orthopedic Surgery

## 2014-01-14 DIAGNOSIS — M67919 Unspecified disorder of synovium and tendon, unspecified shoulder: Secondary | ICD-10-CM | POA: Insufficient documentation

## 2014-01-14 DIAGNOSIS — M719 Bursopathy, unspecified: Secondary | ICD-10-CM | POA: Insufficient documentation

## 2014-01-14 DIAGNOSIS — M25519 Pain in unspecified shoulder: Secondary | ICD-10-CM | POA: Insufficient documentation

## 2014-01-14 DIAGNOSIS — S43499A Other sprain of unspecified shoulder joint, initial encounter: Secondary | ICD-10-CM | POA: Insufficient documentation

## 2014-01-14 DIAGNOSIS — R52 Pain, unspecified: Secondary | ICD-10-CM

## 2014-01-14 DIAGNOSIS — S46819A Strain of other muscles, fascia and tendons at shoulder and upper arm level, unspecified arm, initial encounter: Principal | ICD-10-CM

## 2014-01-14 DIAGNOSIS — M19019 Primary osteoarthritis, unspecified shoulder: Secondary | ICD-10-CM | POA: Insufficient documentation

## 2014-01-14 DIAGNOSIS — W19XXXA Unspecified fall, initial encounter: Secondary | ICD-10-CM | POA: Insufficient documentation

## 2014-02-26 ENCOUNTER — Telehealth (HOSPITAL_COMMUNITY): Payer: Self-pay

## 2014-02-27 ENCOUNTER — Encounter (HOSPITAL_COMMUNITY): Payer: Medicare Other | Attending: Hematology and Oncology

## 2014-02-27 DIAGNOSIS — C9210 Chronic myeloid leukemia, BCR/ABL-positive, not having achieved remission: Secondary | ICD-10-CM | POA: Diagnosis present

## 2014-02-27 LAB — CBC WITH DIFFERENTIAL/PLATELET
BASOS PCT: 1 % (ref 0–1)
Basophils Absolute: 0 10*3/uL (ref 0.0–0.1)
Eosinophils Absolute: 0.1 10*3/uL (ref 0.0–0.7)
Eosinophils Relative: 2 % (ref 0–5)
HEMATOCRIT: 32.8 % — AB (ref 36.0–46.0)
Hemoglobin: 10.6 g/dL — ABNORMAL LOW (ref 12.0–15.0)
Lymphocytes Relative: 27 % (ref 12–46)
Lymphs Abs: 1.1 10*3/uL (ref 0.7–4.0)
MCH: 33 pg (ref 26.0–34.0)
MCHC: 32.3 g/dL (ref 30.0–36.0)
MCV: 102.2 fL — AB (ref 78.0–100.0)
MONO ABS: 0.4 10*3/uL (ref 0.1–1.0)
MONOS PCT: 10 % (ref 3–12)
Neutro Abs: 2.4 10*3/uL (ref 1.7–7.7)
Neutrophils Relative %: 61 % (ref 43–77)
Platelets: 103 10*3/uL — ABNORMAL LOW (ref 150–400)
RBC: 3.21 MIL/uL — ABNORMAL LOW (ref 3.87–5.11)
RDW: 16.1 % — AB (ref 11.5–15.5)
WBC: 4 10*3/uL (ref 4.0–10.5)

## 2014-02-27 LAB — COMPREHENSIVE METABOLIC PANEL
ALT: 12 U/L (ref 0–35)
ANION GAP: 10 (ref 5–15)
AST: 37 U/L (ref 0–37)
Albumin: 3.7 g/dL (ref 3.5–5.2)
Alkaline Phosphatase: 63 U/L (ref 39–117)
BILIRUBIN TOTAL: 0.2 mg/dL — AB (ref 0.3–1.2)
BUN: 21 mg/dL (ref 6–23)
CO2: 23 meq/L (ref 19–32)
CREATININE: 1.85 mg/dL — AB (ref 0.50–1.10)
Calcium: 8.6 mg/dL (ref 8.4–10.5)
Chloride: 112 mEq/L (ref 96–112)
GFR calc Af Amer: 30 mL/min — ABNORMAL LOW (ref >90)
GFR, EST NON AFRICAN AMERICAN: 26 mL/min — AB (ref >90)
Glucose, Bld: 82 mg/dL (ref 70–99)
Potassium: 4.5 mEq/L (ref 3.7–5.3)
Sodium: 145 mEq/L (ref 137–147)
Total Protein: 6.1 g/dL (ref 6.0–8.3)

## 2014-02-27 NOTE — Progress Notes (Signed)
Labs drawn for cbcd,cmp,bcr/abl gene

## 2014-02-28 ENCOUNTER — Ambulatory Visit (HOSPITAL_COMMUNITY): Payer: Medicare Other | Admitting: Oncology

## 2014-02-28 ENCOUNTER — Other Ambulatory Visit (HOSPITAL_COMMUNITY): Payer: Medicare Other

## 2014-03-02 LAB — BCR/ABL GENE REARRANGEMENT QNT, PCR
BCR ABL1 / ABL1 IS: 0 %
BCR ABL1/ABL1: 0 %

## 2014-03-02 LAB — P190 BCR-ABL 1: P190 BCR ABL1: NOT DETECTED

## 2014-03-02 LAB — P210 BCR-ABL 1: P210 BCR ABL1: NOT DETECTED

## 2014-03-02 NOTE — Progress Notes (Signed)
-  No show-  Ana Nelson  

## 2014-03-03 ENCOUNTER — Ambulatory Visit (HOSPITAL_COMMUNITY): Payer: Medicare Other | Admitting: Oncology

## 2014-03-24 NOTE — Progress Notes (Signed)
Ana Bogus, MD [place] [address]  CML (chronic myelocytic leukemia)  CURRENT THERAPY: Gleevac 400 mg daily  INTERVAL HISTORY: Ana Nelson 73 y.o. 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   She is tolerating therapy well.  I personally reviewed and went over laboratory results with the patient.  The results are noted within this dictation.  I personally reviewed and went over radiographic studies with the patient.  The results are noted within this dictation.  She is overdue for her screening mammogram that was due in July 2015.  The patient wishes to have her influenza vaccine today which we will give.  She remains in a cytogenetic remission from her CML standpoint. She denies any symptoms including B. symptoms, fevers, chills, night sweats, unintentional weight loss.  She reports that she's having difficulty swallowing has an upcoming appointment with her endocrinologist. She reports that he better not give her medication that will make her gained weight. She is very self-conscious about her weight she is very happy with her current weight.  Hematologically, the patient denies any complaints and ROS questioning is negative.  Past Medical History  Diagnosis Date  . Osteoarthritis   . CML (chronic myelocytic leukemia)   . History of migraine headaches   . Right knee pain   . Injury of left shoulder   . Blindness of right eye   . Drooping eyelid     left eye  . 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 Shingles     has CML (chronic myelocytic leukemia) on her problem list.     is allergic to nubain; perphenazine; ibuprofen; tylenol; and vistaril.  We administered Influenza vac split quadrivalent PF.  Past Surgical History  Procedure Laterality Date  . Bowel blockage surgery    .  Right kidney removed  1980  . Bone marrow bx & asp    . Rt. cataract surgery      Denies any headaches, dizziness, double vision, fevers, chills, night sweats, nausea, vomiting, diarrhea, constipation, chest pain, heart palpitations, shortness of breath, blood in stool, black tarry stool, urinary pain, urinary burning, urinary frequency, hematuria.   PHYSICAL EXAMINATION  ECOG PERFORMANCE STATUS: 0 - Asymptomatic  Filed Vitals:   03/26/14 1400  BP: 118/59  Pulse: 63  Temp: 98.3 F (36.8 C)  Resp: 18    GENERAL:alert, no distress, well nourished, well developed, comfortable, cooperative and smiling SKIN: skin color, texture, turgor are normal, no rashes or significant lesions HEAD: Normocephalic, No masses, lesions, tenderness or abnormalities EYES: normal, PERRLA, EOMI, Conjunctiva are pink and non-injected EARS: External ears normal OROPHARYNX:lips, buccal mucosa, and tongue normal and mucous membranes are moist  NECK: supple, no adenopathy, thyroid normal size, non-tender, without nodularity, no stridor, non-tender, trachea midline LYMPH:  no palpable lymphadenopathy BREAST:not examined LUNGS: clear to auscultation and percussion HEART: regular rate & rhythm, no murmurs and no gallops ABDOMEN:abdomen soft, non-tender, normal bowel sounds, no masses or organomegaly and no hepatosplenomegaly BACK: Back symmetric, no curvature. EXTREMITIES:less then 2 second capillary refill, no joint deformities, effusion, or inflammation, no edema, no skin discoloration, no clubbing, no cyanosis  NEURO: alert & oriented x 3 with fluent speech, no focal motor/sensory deficits, gait normal   LABORATORY DATA: CBC    Component Value Date/Time   WBC 4.0 02/27/2014 1107   RBC 3.21* 02/27/2014  1107   HGB 10.6* 02/27/2014 1107   HCT 32.8* 02/27/2014 1107   PLT 103* 02/27/2014 1107   MCV 102.2* 02/27/2014 1107   MCH 33.0 02/27/2014 1107   MCHC 32.3 02/27/2014 1107   RDW 16.1* 02/27/2014 1107    LYMPHSABS 1.1 02/27/2014 1107   MONOABS 0.4 02/27/2014 1107   EOSABS 0.1 02/27/2014 1107   BASOSABS 0.0 02/27/2014 1107      Chemistry      Component Value Date/Time   NA 145 02/27/2014 1107   K 4.5 02/27/2014 1107   CL 112 02/27/2014 1107   CO2 23 02/27/2014 1107   BUN 21 02/27/2014 1107   CREATININE 1.85* 02/27/2014 1107      Component Value Date/Time   CALCIUM 8.6 02/27/2014 1107   ALKPHOS 63 02/27/2014 1107   AST 37 02/27/2014 1107   ALT 12 02/27/2014 1107   BILITOT 0.2* 02/27/2014 1107     Results for Ana Nelson, Ana Nelson (MRN [id number]) as of 03/02/2014 09:53  Ref. Range 08/26/2013 11:35  BCR ABL1 / ABL1 No range found 0.000  BCR ABL1 / ABL1 IS No range found 0.000  P190 BCR ABL1 No range found Not Detected  P210 BCR ABL1 No range found Not Detected  Interpretation - BCRQ No range found REPORT     ASSESSMENT:  1. 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  2. Renal sufficiency  3. Migraine headaches  4. Depression and anxiety intermittently.  Patient Active Problem List   Diagnosis Date Noted  . CML (chronic myelocytic leukemia) 05/04/2012     PLAN:  1. I personally reviewed and went over laboratory results with the patient. The results are noted within this dictation.  2. I personally reviewed and went over radiographic studies with the patient. The results are noted within this dictation.  3. Screening mammogram is overdue which was due in July 2015, she reports she is having it done in October 2015.  4. Labs in 3 months: CBC diff, CMET  5. Labs in 6 months: CBC diff, CMET, BCR/ABL  6. Influenza vaccine given today 7. Return in 6 months for follow-up   THERAPY PLAN:  Patient is tolerating therapy well and her last BCR/ABL was negative. We'll continue with current therapy consisting of Gleevec. We'll continue with surveillance via a laboratory work every 3 months with a BCR/ABL every 6  months.   All questions were answered. The patient knows to call the clinic with any problems, questions or concerns. We can certainly see the patient much sooner if necessary.  Patient and plan discussed with Dr. Farrel Gobble and he is in agreement with the aforementioned.   KEFALAS,THOMAS 03/26/2014

## 2014-03-26 ENCOUNTER — Encounter (HOSPITAL_COMMUNITY): Payer: Medicare Other | Attending: Hematology and Oncology | Admitting: Oncology

## 2014-03-26 ENCOUNTER — Encounter (HOSPITAL_COMMUNITY): Payer: Self-pay | Admitting: Oncology

## 2014-03-26 VITALS — BP 118/59 | HR 63 | Temp 98.3°F | Resp 18 | Wt 121.7 lb

## 2014-03-26 DIAGNOSIS — C9211 Chronic myeloid leukemia, BCR/ABL-positive, in remission: Secondary | ICD-10-CM

## 2014-03-26 DIAGNOSIS — Z23 Encounter for immunization: Secondary | ICD-10-CM

## 2014-03-26 DIAGNOSIS — C9210 Chronic myeloid leukemia, BCR/ABL-positive, not having achieved remission: Secondary | ICD-10-CM

## 2014-03-26 MED ORDER — INFLUENZA VAC SPLIT QUAD 0.5 ML IM SUSY
0.5000 mL | PREFILLED_SYRINGE | Freq: Once | INTRAMUSCULAR | Status: AC
  Administered 2014-03-26: 0.5 mL via INTRAMUSCULAR
  Filled 2014-03-26: qty 0.5

## 2014-03-26 NOTE — Progress Notes (Signed)
Flu shot given to left deltoid.  Pt tolerated well.

## 2014-03-26 NOTE — Patient Instructions (Signed)
[facility] Discharge Instructions  RECOMMENDATIONS MADE BY THE CONSULTANT AND ANY TEST RESULTS WILL BE SENT TO YOUR REFERRING PHYSICIAN.  We will see you in 3 months for lab work, then again in 6 months for repeat lab work and you will see the doctor at that time. Please call for any questions or concerns. We gave you a flu-shot today. Information sheet given.   Thank you for choosing [facility] to provide your oncology and hematology care.  To afford each patient quality time with our providers, please arrive at least 15 minutes before your scheduled appointment time.  With your help, our goal is to use those 15 minutes to complete the necessary work-up to ensure our physicians have the information they need to help with your evaluation and healthcare recommendations.    Effective January 1st, 2014, we ask that you re-schedule your appointment with our physicians should you arrive 10 or more minutes late for your appointment.  We strive to give you quality time with our providers, and arriving late affects you and other patients whose appointments are after yours.    Again, thank you for choosing [facility].  Our hope is that these requests will decrease the amount of time that you wait before being seen by our physicians.       _____________________________________________________________  Should you have questions after your visit to [facility], please contact our office at [phone] between the hours of 8:30 a.m. and 4:30 p.m.  Voicemails left after 4:30 p.m. will not be returned until the following business day.  For prescription refill requests, have your pharmacy contact our office with your prescription refill request.    _______________________________________________________________  We hope that we have given you very good care.  You may receive a patient satisfaction survey in the mail, please complete it and  return it as soon as possible.  We value your feedback!  _______________________________________________________________  Have you asked about our STAR program?  STAR stands for Survivorship Training and Rehabilitation, and this is a nationally recognized cancer care program that focuses on survivorship and rehabilitation.  Cancer and cancer treatments may cause problems, such as, pain, making you feel tired and keeping you from doing the things that you need or want to do. Cancer rehabilitation can help. Our goal is to reduce these troubling effects and help you have the best quality of life possible.  You may receive a survey from a nurse that asks questions about your current state of health.  Based on the survey results, all eligible patients will be referred to the [facility] program for an evaluation so we can better serve you!  A frequently asked questions sheet is available upon request.

## 2014-03-27 ENCOUNTER — Telehealth (HOSPITAL_COMMUNITY): Payer: Self-pay | Admitting: Oncology

## 2014-03-27 ENCOUNTER — Telehealth (HOSPITAL_COMMUNITY): Payer: Self-pay

## 2014-03-27 NOTE — Telephone Encounter (Signed)
She can get that if she wants.  Does she want [place] or primary care provider to give it?

## 2014-03-27 NOTE — Telephone Encounter (Signed)
[facility], PA-C at 03/27/2014 3:14 PM     Status: Signed        She can get that if she wants. Does she want [place] or primary care provider to give it?   Patient notified and not sure she wants to get it.  She will discuss with PCP on next visit.

## 2014-04-03 ENCOUNTER — Other Ambulatory Visit (HOSPITAL_COMMUNITY): Payer: Self-pay | Admitting: Pulmonary Disease

## 2014-04-03 DIAGNOSIS — Z139 Encounter for screening, unspecified: Secondary | ICD-10-CM

## 2014-04-06 ENCOUNTER — Other Ambulatory Visit (HOSPITAL_COMMUNITY): Payer: Self-pay | Admitting: Oncology

## 2014-04-30 ENCOUNTER — Ambulatory Visit (HOSPITAL_COMMUNITY): Payer: Medicare Other

## 2014-05-07 ENCOUNTER — Ambulatory Visit (HOSPITAL_COMMUNITY): Payer: Medicare Other

## 2014-05-09 ENCOUNTER — Ambulatory Visit (HOSPITAL_COMMUNITY)
Admission: RE | Admit: 2014-05-09 | Discharge: 2014-05-09 | Disposition: A | Discharge status: Home / Self Care | Payer: Medicare Other | Source: Ambulatory Visit | Attending: Pulmonary Disease | Admitting: Pulmonary Disease

## 2014-05-09 DIAGNOSIS — Z1231 Encounter for screening mammogram for malignant neoplasm of breast: Secondary | ICD-10-CM | POA: Insufficient documentation

## 2014-05-09 DIAGNOSIS — Z139 Encounter for screening, unspecified: Secondary | ICD-10-CM

## 2014-05-22 ENCOUNTER — Other Ambulatory Visit (HOSPITAL_COMMUNITY): Payer: Self-pay | Admitting: Pulmonary Disease

## 2014-05-22 ENCOUNTER — Ambulatory Visit (HOSPITAL_COMMUNITY)
Admission: RE | Admit: 2014-05-22 | Discharge: 2014-05-22 | Disposition: A | Discharge status: Home / Self Care | Payer: Medicare Other | Source: Ambulatory Visit | Attending: Pulmonary Disease | Admitting: Pulmonary Disease

## 2014-05-22 DIAGNOSIS — M79671 Pain in right foot: Secondary | ICD-10-CM | POA: Diagnosis not present

## 2014-06-13 ENCOUNTER — Ambulatory Visit: Payer: Medicare Other | Admitting: Obstetrics & Gynecology

## 2014-06-23 ENCOUNTER — Ambulatory Visit (HOSPITAL_COMMUNITY)
Admission: RE | Admit: 2014-06-23 | Discharge: 2014-06-23 | Disposition: A | Discharge status: Home / Self Care | Payer: Medicare Other | Source: Ambulatory Visit | Attending: Pulmonary Disease | Admitting: Pulmonary Disease

## 2014-06-23 ENCOUNTER — Other Ambulatory Visit (HOSPITAL_COMMUNITY): Payer: Self-pay | Admitting: Pulmonary Disease

## 2014-06-23 DIAGNOSIS — M79641 Pain in right hand: Secondary | ICD-10-CM | POA: Diagnosis present

## 2014-06-23 DIAGNOSIS — R609 Edema, unspecified: Secondary | ICD-10-CM

## 2014-06-23 DIAGNOSIS — M189 Osteoarthritis of first carpometacarpal joint, unspecified: Secondary | ICD-10-CM | POA: Diagnosis not present

## 2014-06-23 DIAGNOSIS — M7989 Other specified soft tissue disorders: Secondary | ICD-10-CM | POA: Insufficient documentation

## 2014-06-25 ENCOUNTER — Encounter (HOSPITAL_COMMUNITY): Payer: Medicare Other

## 2014-06-30 ENCOUNTER — Other Ambulatory Visit (HOSPITAL_COMMUNITY): Payer: Self-pay | Admitting: Pulmonary Disease

## 2014-06-30 DIAGNOSIS — M545 Low back pain: Secondary | ICD-10-CM

## 2014-07-02 ENCOUNTER — Encounter (HOSPITAL_COMMUNITY): Payer: Medicare Other

## 2014-07-07 ENCOUNTER — Ambulatory Visit (HOSPITAL_COMMUNITY): Payer: Medicare Other

## 2014-07-09 ENCOUNTER — Other Ambulatory Visit (HOSPITAL_COMMUNITY): Payer: Medicare Other

## 2014-07-10 ENCOUNTER — Encounter (HOSPITAL_COMMUNITY): Payer: Medicare Other | Attending: Hematology & Oncology

## 2014-07-10 DIAGNOSIS — C9290 Myeloid leukemia, unspecified, not having achieved remission: Secondary | ICD-10-CM | POA: Diagnosis not present

## 2014-07-10 DIAGNOSIS — C9210 Chronic myeloid leukemia, BCR/ABL-positive, not having achieved remission: Secondary | ICD-10-CM

## 2014-07-10 LAB — CBC WITH DIFFERENTIAL/PLATELET
BASOS ABS: 0 10*3/uL (ref 0.0–0.1)
Basophils Relative: 1 % (ref 0–1)
Eosinophils Absolute: 0.1 10*3/uL (ref 0.0–0.7)
Eosinophils Relative: 3 % (ref 0–5)
HCT: 33.7 % — ABNORMAL LOW (ref 36.0–46.0)
Hemoglobin: 10.8 g/dL — ABNORMAL LOW (ref 12.0–15.0)
Lymphocytes Relative: 34 % (ref 12–46)
Lymphs Abs: 1.1 10*3/uL (ref 0.7–4.0)
MCH: 33.9 pg (ref 26.0–34.0)
MCHC: 32 g/dL (ref 30.0–36.0)
MCV: 105.6 fL — ABNORMAL HIGH (ref 78.0–100.0)
Monocytes Absolute: 0.3 10*3/uL (ref 0.1–1.0)
Monocytes Relative: 11 % (ref 3–12)
NEUTROS ABS: 1.7 10*3/uL (ref 1.7–7.7)
NEUTROS PCT: 52 % (ref 43–77)
Platelets: 97 10*3/uL — ABNORMAL LOW (ref 150–400)
RBC: 3.19 MIL/uL — ABNORMAL LOW (ref 3.87–5.11)
RDW: 15.9 % — AB (ref 11.5–15.5)
SMEAR REVIEW: DECREASED
WBC: 3.3 10*3/uL — ABNORMAL LOW (ref 4.0–10.5)

## 2014-07-10 LAB — COMPREHENSIVE METABOLIC PANEL
ALBUMIN: 3.7 g/dL (ref 3.5–5.2)
ALK PHOS: 67 U/L (ref 39–117)
ALT: 22 U/L (ref 0–35)
AST: 42 U/L — AB (ref 0–37)
Anion gap: 5 (ref 5–15)
BUN: 21 mg/dL (ref 6–23)
CALCIUM: 8.6 mg/dL (ref 8.4–10.5)
CO2: 24 mmol/L (ref 19–32)
Chloride: 110 mEq/L (ref 96–112)
Creatinine, Ser: 1.34 mg/dL — ABNORMAL HIGH (ref 0.50–1.10)
GFR calc non Af Amer: 38 mL/min — ABNORMAL LOW (ref >90)
GFR, EST AFRICAN AMERICAN: 44 mL/min — AB (ref >90)
Glucose, Bld: 105 mg/dL — ABNORMAL HIGH (ref 70–99)
Potassium: 4.1 mmol/L (ref 3.5–5.1)
Sodium: 139 mmol/L (ref 135–145)
Total Bilirubin: 0.3 mg/dL (ref 0.3–1.2)
Total Protein: 5.6 g/dL — ABNORMAL LOW (ref 6.0–8.3)

## 2014-07-10 NOTE — Progress Notes (Signed)
Ana Nelson presented for labwork. Labs per MD order drawn via Peripheral Line 23 gauge needle inserted in left ac.  Good blood return present. Procedure without incident.  Needle removed intact. Patient tolerated procedure well.

## 2014-08-14 ENCOUNTER — Other Ambulatory Visit (HOSPITAL_COMMUNITY): Payer: Self-pay | Admitting: Pulmonary Disease

## 2014-08-14 DIAGNOSIS — M545 Low back pain, unspecified: Secondary | ICD-10-CM

## 2014-08-18 DIAGNOSIS — M25561 Pain in right knee: Secondary | ICD-10-CM | POA: Diagnosis not present

## 2014-08-18 DIAGNOSIS — M47816 Spondylosis without myelopathy or radiculopathy, lumbar region: Secondary | ICD-10-CM | POA: Diagnosis not present

## 2014-08-21 ENCOUNTER — Ambulatory Visit (HOSPITAL_COMMUNITY)
Admission: RE | Admit: 2014-08-21 | Discharge: 2014-08-21 | Disposition: A | Discharge status: Home / Self Care | Payer: Medicare Other | Source: Ambulatory Visit | Attending: Pulmonary Disease | Admitting: Pulmonary Disease

## 2014-08-21 DIAGNOSIS — M5136 Other intervertebral disc degeneration, lumbar region: Secondary | ICD-10-CM | POA: Insufficient documentation

## 2014-08-21 DIAGNOSIS — E0520 Thyrotoxicosis with toxic multinodular goiter without thyrotoxic crisis or storm: Secondary | ICD-10-CM | POA: Diagnosis not present

## 2014-08-21 DIAGNOSIS — E039 Hypothyroidism, unspecified: Secondary | ICD-10-CM | POA: Diagnosis not present

## 2014-08-21 DIAGNOSIS — M545 Low back pain, unspecified: Secondary | ICD-10-CM

## 2014-08-21 DIAGNOSIS — M4806 Spinal stenosis, lumbar region: Secondary | ICD-10-CM | POA: Diagnosis not present

## 2014-08-21 DIAGNOSIS — E785 Hyperlipidemia, unspecified: Secondary | ICD-10-CM | POA: Diagnosis not present

## 2014-08-25 ENCOUNTER — Other Ambulatory Visit (HOSPITAL_COMMUNITY): Payer: Self-pay

## 2014-09-17 ENCOUNTER — Encounter (HOSPITAL_COMMUNITY): Payer: Medicare Other

## 2014-09-17 DIAGNOSIS — N19 Unspecified kidney failure: Secondary | ICD-10-CM | POA: Diagnosis not present

## 2014-09-17 DIAGNOSIS — I10 Essential (primary) hypertension: Secondary | ICD-10-CM | POA: Diagnosis not present

## 2014-09-17 DIAGNOSIS — G43909 Migraine, unspecified, not intractable, without status migrainosus: Secondary | ICD-10-CM | POA: Diagnosis not present

## 2014-09-17 DIAGNOSIS — C9290 Myeloid leukemia, unspecified, not having achieved remission: Secondary | ICD-10-CM | POA: Diagnosis not present

## 2014-09-18 ENCOUNTER — Encounter (HOSPITAL_COMMUNITY): Payer: Medicare Other | Attending: Oncology

## 2014-09-18 DIAGNOSIS — C9210 Chronic myeloid leukemia, BCR/ABL-positive, not having achieved remission: Secondary | ICD-10-CM | POA: Diagnosis not present

## 2014-09-18 LAB — COMPREHENSIVE METABOLIC PANEL
ALT: 19 U/L (ref 0–35)
ANION GAP: 7 (ref 5–15)
AST: 39 U/L — ABNORMAL HIGH (ref 0–37)
Albumin: 3.8 g/dL (ref 3.5–5.2)
Alkaline Phosphatase: 58 U/L (ref 39–117)
BILIRUBIN TOTAL: 0.4 mg/dL (ref 0.3–1.2)
BUN: 18 mg/dL (ref 6–23)
CALCIUM: 8.7 mg/dL (ref 8.4–10.5)
CO2: 23 mmol/L (ref 19–32)
Chloride: 113 mmol/L — ABNORMAL HIGH (ref 96–112)
Creatinine, Ser: 1.48 mg/dL — ABNORMAL HIGH (ref 0.50–1.10)
GFR calc Af Amer: 39 mL/min — ABNORMAL LOW (ref >90)
GFR, EST NON AFRICAN AMERICAN: 34 mL/min — AB (ref >90)
Glucose, Bld: 105 mg/dL — ABNORMAL HIGH (ref 70–99)
Potassium: 3.4 mmol/L — ABNORMAL LOW (ref 3.5–5.1)
SODIUM: 143 mmol/L (ref 135–145)
TOTAL PROTEIN: 6 g/dL (ref 6.0–8.3)

## 2014-09-18 LAB — CBC WITH DIFFERENTIAL/PLATELET
BASOS PCT: 1 % (ref 0–1)
Basophils Absolute: 0 10*3/uL (ref 0.0–0.1)
EOS ABS: 0.1 10*3/uL (ref 0.0–0.7)
Eosinophils Relative: 4 % (ref 0–5)
HEMATOCRIT: 35.2 % — AB (ref 36.0–46.0)
Hemoglobin: 11.2 g/dL — ABNORMAL LOW (ref 12.0–15.0)
Lymphocytes Relative: 24 % (ref 12–46)
Lymphs Abs: 0.7 10*3/uL (ref 0.7–4.0)
MCH: 33.7 pg (ref 26.0–34.0)
MCHC: 31.8 g/dL (ref 30.0–36.0)
MCV: 106 fL — AB (ref 78.0–100.0)
Monocytes Absolute: 0.3 10*3/uL (ref 0.1–1.0)
Monocytes Relative: 11 % (ref 3–12)
Neutro Abs: 1.8 10*3/uL (ref 1.7–7.7)
Neutrophils Relative %: 60 % (ref 43–77)
Platelets: 86 10*3/uL — ABNORMAL LOW (ref 150–400)
RBC: 3.32 MIL/uL — AB (ref 3.87–5.11)
RDW: 15.1 % (ref 11.5–15.5)
Smear Review: DECREASED
WBC: 3 10*3/uL — AB (ref 4.0–10.5)

## 2014-09-18 NOTE — Progress Notes (Signed)
LABS DRAWN

## 2014-09-19 ENCOUNTER — Other Ambulatory Visit (HOSPITAL_COMMUNITY): Payer: Self-pay | Admitting: Oncology

## 2014-09-19 DIAGNOSIS — C9210 Chronic myeloid leukemia, BCR/ABL-positive, not having achieved remission: Secondary | ICD-10-CM

## 2014-09-19 DIAGNOSIS — E876 Hypokalemia: Secondary | ICD-10-CM

## 2014-09-19 MED ORDER — POTASSIUM CHLORIDE ER 10 MEQ PO TBCR: 10.0000 meq | EXTENDED_RELEASE_TABLET | Freq: Two times a day (BID) | ORAL | Status: DC

## 2014-09-21 LAB — P210 BCR-ABL 1: P210 BCR ABL1: NOT DETECTED

## 2014-09-21 LAB — BCR/ABL GENE REARRANGEMENT QNT, PCR
BCR ABL1 / ABL1 IS: 0 %
BCR ABL1/ABL1: 0 %

## 2014-09-21 LAB — P190 BCR-ABL 1: P190 BCR ABL1: NOT DETECTED

## 2014-09-22 DIAGNOSIS — M545 Low back pain: Secondary | ICD-10-CM | POA: Diagnosis not present

## 2014-09-24 ENCOUNTER — Ambulatory Visit (HOSPITAL_COMMUNITY): Payer: Medicare Other | Admitting: Oncology

## 2014-09-24 NOTE — Progress Notes (Signed)
-  Rescheduled-  Ana Nelson  

## 2014-09-24 NOTE — Assessment & Plan Note (Signed)
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  Labs in 3 months: CBC diff, CMET.  Labs in 6 months: CBC diff, CMET, BCR/ABL.  Compliance with Gleevac re-enforced.  Return in 6 months for follow-up.

## 2014-10-02 NOTE — Assessment & Plan Note (Addendum)
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  Labs in 3 months: CBC diff, CMET.  Labs in 6 months: CBC diff, CMET, BCR/ABL.  Compliance with Gleevac re-enforced.  Return in 6 months for follow-up. 

## 2014-10-02 NOTE — Progress Notes (Signed)
Ana Bogus, MD [address] [place] [address]  CML (chronic myelocytic leukemia) - Plan: gabapentin (NEURONTIN) 300 MG capsule, CBC with Differential, Comprehensive metabolic panel, Bcr/abl gene rearrangement qnt, PCR  CURRENT THERAPY: Gleevac 400 mg daily  INTERVAL HISTORY: Ana Nelson 74 y.o. 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  I personally reviewed and went over laboratory results with the patient.  The results are noted within this dictation.    I personally reviewed and went over radiographic studies with the patient.  The results are noted within this dictation.    She remains in a cytogenetic remission from her CML standpoint. She denies any symptoms including B. symptoms, fevers, chills, night sweats, unintentional weight loss.  Hematologically, she denies any complaints and ROS questioning is negative.  Past Medical History  Diagnosis Date  . Osteoarthritis   . CML (chronic myelocytic leukemia)   . History of migraine headaches   . Right knee pain   . Injury of left shoulder   . Blindness of right eye   . Drooping eyelid     left eye  . 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 Shingles     has CML (chronic myelocytic leukemia) on her problem list.     is allergic to nubain; perphenazine; ibuprofen; tylenol; and vistaril.  Ana Nelson does not currently have medications on file.  Past Surgical History  Procedure Laterality Date  . Bowel blockage surgery    . Right kidney removed  1980  . Bone marrow bx & asp    . Rt. cataract surgery      Denies any headaches, dizziness, double vision, fevers, chills, night sweats, nausea, vomiting, diarrhea, constipation, chest pain, heart palpitations, shortness of breath, blood in stool, black tarry stool, urinary pain, urinary burning, urinary  frequency, hematuria.   PHYSICAL EXAMINATION  ECOG PERFORMANCE STATUS: 0 - Asymptomatic  Filed Vitals:   10/06/14 1342  BP: 147/65  Pulse: 54  Temp: 98.3 F (36.8 C)  Resp: 17    GENERAL:alert, no distress, well nourished, well developed, comfortable, cooperative and smiling SKIN: skin color, texture, turgor are normal, no rashes or significant lesions HEAD: Normocephalic, No masses, lesions, tenderness or abnormalities EYES: normal, PERRLA, EOMI, Conjunctiva are pink and non-injected EARS: External ears normal OROPHARYNX:lips, buccal mucosa, and tongue normal and mucous membranes are moist  NECK: supple, no adenopathy, thyroid normal size, non-tender, without nodularity, no stridor, non-tender, trachea midline LYMPH:  no palpable lymphadenopathy, no hepatosplenomegaly BREAST:breasts appear normal, no suspicious masses, no skin or nipple changes or axillary nodes LUNGS: clear to auscultation and percussion HEART: regular rate & rhythm, no murmurs, no gallops, S1 normal and S2 normal ABDOMEN:abdomen soft, non-tender, normal bowel sounds, no masses or organomegaly and no hepatosplenomegaly BACK: Back symmetric, no curvature., No CVA tenderness EXTREMITIES:less then 2 second capillary refill, no joint deformities, effusion, or inflammation, no edema, no skin discoloration, no clubbing, no cyanosis  NEURO: alert & oriented x 3 with fluent speech, no focal motor/sensory deficits, gait normal   LABORATORY DATA: CBC    Component Value Date/Time   WBC 3.0* 09/18/2014 1149   RBC 3.32* 09/18/2014 1149   HGB 11.2* 09/18/2014 1149   HCT 35.2* 09/18/2014 1149   PLT 86* 09/18/2014 1149   MCV 106.0* 09/18/2014 1149   MCH 33.7 09/18/2014 1149   MCHC 31.8 09/18/2014 1149  RDW 15.1 09/18/2014 1149   LYMPHSABS 0.7 09/18/2014 1149   MONOABS 0.3 09/18/2014 1149   EOSABS 0.1 09/18/2014 1149   BASOSABS 0.0 09/18/2014 1149      Chemistry      Component Value Date/Time   NA 143  09/18/2014 1149   K 3.4* 09/18/2014 1149   CL 113* 09/18/2014 1149   CO2 23 09/18/2014 1149   BUN 18 09/18/2014 1149   CREATININE 1.48* 09/18/2014 1149      Component Value Date/Time   CALCIUM 8.7 09/18/2014 1149   ALKPHOS 58 09/18/2014 1149   AST 39* 09/18/2014 1149   ALT 19 09/18/2014 1149   BILITOT 0.4 09/18/2014 1149     Results for Ana Nelson, Ana Nelson (MRN [id number]) as of 09/24/2014 08:53  Ref. Range 09/18/2014 11:49  BCR ABL1 / ABL1 Latest Units: % 0.000  BCR ABL1 / ABL1 IS Latest Units: % 0.000  P190 BCR ABL1 No range found Not Detected  P210 BCR ABL1 No range found Not Detected  Interpretation - BCRQ No range found REPORT     ASSESSMENT AND PLAN:  CML (chronic myelocytic leukemia) CML [place] chromosome positive, presenting here in September 2000 still in a complete cytogenetic remission on Gleevec.    Labs in 3 months: CBC diff, CMET.  Labs in 6 months: CBC diff, CMET, BCR/ABL.  Compliance with Gleevac re-enforced.  Return in 6 months for follow-up.    THERAPY PLAN:  Continue daily Gleevac.  Report signs and symptoms related to [facility] as she is well educated on B symptoms.  We will continue to monitor from progression of disease.  All questions were answered. The patient knows to call the clinic with any problems, questions or concerns. We can certainly see the patient much sooner if necessary.  Patient and plan discussed with Dr. Ancil Linsey and she is in agreement with the aforementioned.   This note is electronically signed by: Robynn Pane 10/06/2014 1:51 PM

## 2014-10-06 ENCOUNTER — Encounter (HOSPITAL_BASED_OUTPATIENT_CLINIC_OR_DEPARTMENT_OTHER): Payer: Medicare Other | Admitting: Oncology

## 2014-10-06 ENCOUNTER — Encounter (HOSPITAL_COMMUNITY): Payer: Self-pay | Admitting: Oncology

## 2014-10-06 VITALS — BP 147/65 | HR 54 | Temp 98.3°F | Resp 17 | Wt 119.8 lb

## 2014-10-06 DIAGNOSIS — C9211 Chronic myeloid leukemia, BCR/ABL-positive, in remission: Secondary | ICD-10-CM | POA: Diagnosis not present

## 2014-10-06 DIAGNOSIS — C9210 Chronic myeloid leukemia, BCR/ABL-positive, not having achieved remission: Secondary | ICD-10-CM

## 2014-10-06 DIAGNOSIS — D649 Anemia, unspecified: Secondary | ICD-10-CM

## 2014-10-06 NOTE — Patient Instructions (Signed)
[place] at [facility] Discharge Instructions  RECOMMENDATIONS MADE BY THE CONSULTANT AND ANY TEST RESULTS WILL BE SENT TO YOUR REFERRING PHYSICIAN.  Exam and discussion by Robynn Pane, PA-C You are doing great.  Labs are stable, BCR/ABL is negative Call with any concerns or issues.  Labs in 3 and 6 months Office visit in 6 months.  Thank you for choosing [place] at [facility] to provide your oncology and hematology care.  To afford each patient quality time with our provider, please arrive at least 15 minutes before your scheduled appointment time.    You need to re-schedule your appointment should you arrive 10 or more minutes late.  We strive to give you quality time with our providers, and arriving late affects you and other patients whose appointments are after yours.  Also, if you no show three or more times for appointments you may be dismissed from the clinic at the providers discretion.     Again, thank you for choosing [facility].  Our hope is that these requests will decrease the amount of time that you wait before being seen by our physicians.       _____________________________________________________________  Should you have questions after your visit to [facility], please contact our office at [phone] between the hours of 8:30 a.m. and 4:30 p.m.  Voicemails left after 4:30 p.m. will not be returned until the following business day.  For prescription refill requests, have your pharmacy contact our office.

## 2014-10-07 ENCOUNTER — Telehealth (HOSPITAL_COMMUNITY): Payer: Self-pay

## 2014-10-07 NOTE — Telephone Encounter (Signed)
Insurance wants Ana Nelson to switch to a generic Gleevec and she wants to know if that's ok?

## 2014-10-07 NOTE — Telephone Encounter (Signed)
Patient notified

## 2014-10-07 NOTE — Telephone Encounter (Signed)
Yes, generic is fine.

## 2014-10-16 DIAGNOSIS — E039 Hypothyroidism, unspecified: Secondary | ICD-10-CM | POA: Diagnosis not present

## 2014-10-27 DIAGNOSIS — M5416 Radiculopathy, lumbar region: Secondary | ICD-10-CM | POA: Diagnosis not present

## 2014-10-27 DIAGNOSIS — M545 Low back pain: Secondary | ICD-10-CM | POA: Diagnosis not present

## 2014-10-30 DIAGNOSIS — I10 Essential (primary) hypertension: Secondary | ICD-10-CM | POA: Diagnosis not present

## 2014-10-30 DIAGNOSIS — E559 Vitamin D deficiency, unspecified: Secondary | ICD-10-CM | POA: Diagnosis not present

## 2014-10-30 DIAGNOSIS — N183 Chronic kidney disease, stage 3 (moderate): Secondary | ICD-10-CM | POA: Diagnosis not present

## 2014-10-30 DIAGNOSIS — D649 Anemia, unspecified: Secondary | ICD-10-CM | POA: Diagnosis not present

## 2014-10-30 DIAGNOSIS — Z79899 Other long term (current) drug therapy: Secondary | ICD-10-CM | POA: Diagnosis not present

## 2014-11-03 DIAGNOSIS — E559 Vitamin D deficiency, unspecified: Secondary | ICD-10-CM | POA: Diagnosis not present

## 2014-11-03 DIAGNOSIS — N183 Chronic kidney disease, stage 3 (moderate): Secondary | ICD-10-CM | POA: Diagnosis not present

## 2014-11-03 DIAGNOSIS — D649 Anemia, unspecified: Secondary | ICD-10-CM | POA: Diagnosis not present

## 2014-11-03 DIAGNOSIS — I10 Essential (primary) hypertension: Secondary | ICD-10-CM | POA: Diagnosis not present

## 2014-11-09 ENCOUNTER — Other Ambulatory Visit (HOSPITAL_COMMUNITY): Payer: Self-pay | Admitting: Oncology

## 2014-11-11 ENCOUNTER — Other Ambulatory Visit (HOSPITAL_COMMUNITY): Payer: Self-pay | Admitting: Oncology

## 2014-12-05 DIAGNOSIS — E039 Hypothyroidism, unspecified: Secondary | ICD-10-CM | POA: Diagnosis not present

## 2014-12-12 DIAGNOSIS — E039 Hypothyroidism, unspecified: Secondary | ICD-10-CM | POA: Diagnosis not present

## 2014-12-12 DIAGNOSIS — D649 Anemia, unspecified: Secondary | ICD-10-CM | POA: Diagnosis not present

## 2014-12-17 DIAGNOSIS — Z0000 Encounter for general adult medical examination without abnormal findings: Secondary | ICD-10-CM | POA: Diagnosis not present

## 2014-12-25 DIAGNOSIS — E039 Hypothyroidism, unspecified: Secondary | ICD-10-CM | POA: Diagnosis not present

## 2014-12-25 DIAGNOSIS — E785 Hyperlipidemia, unspecified: Secondary | ICD-10-CM | POA: Diagnosis not present

## 2014-12-25 DIAGNOSIS — K210 Gastro-esophageal reflux disease with esophagitis: Secondary | ICD-10-CM | POA: Diagnosis not present

## 2014-12-25 DIAGNOSIS — I10 Essential (primary) hypertension: Secondary | ICD-10-CM | POA: Diagnosis not present

## 2014-12-25 DIAGNOSIS — Z0000 Encounter for general adult medical examination without abnormal findings: Secondary | ICD-10-CM | POA: Diagnosis not present

## 2014-12-29 DIAGNOSIS — M47816 Spondylosis without myelopathy or radiculopathy, lumbar region: Secondary | ICD-10-CM | POA: Diagnosis not present

## 2014-12-29 DIAGNOSIS — M545 Low back pain: Secondary | ICD-10-CM | POA: Diagnosis not present

## 2015-01-06 ENCOUNTER — Encounter (HOSPITAL_COMMUNITY): Payer: Medicare Other

## 2015-01-14 DIAGNOSIS — E039 Hypothyroidism, unspecified: Secondary | ICD-10-CM | POA: Diagnosis not present

## 2015-01-14 DIAGNOSIS — E785 Hyperlipidemia, unspecified: Secondary | ICD-10-CM | POA: Diagnosis not present

## 2015-01-21 DIAGNOSIS — E039 Hypothyroidism, unspecified: Secondary | ICD-10-CM | POA: Diagnosis not present

## 2015-01-21 DIAGNOSIS — I480 Paroxysmal atrial fibrillation: Secondary | ICD-10-CM | POA: Diagnosis not present

## 2015-01-21 DIAGNOSIS — F341 Dysthymic disorder: Secondary | ICD-10-CM | POA: Diagnosis not present

## 2015-01-21 DIAGNOSIS — E785 Hyperlipidemia, unspecified: Secondary | ICD-10-CM | POA: Diagnosis not present

## 2015-01-30 ENCOUNTER — Other Ambulatory Visit (HOSPITAL_COMMUNITY): Payer: Medicare Other

## 2015-02-02 DIAGNOSIS — M25561 Pain in right knee: Secondary | ICD-10-CM | POA: Diagnosis not present

## 2015-02-13 ENCOUNTER — Other Ambulatory Visit (HOSPITAL_COMMUNITY): Payer: Medicare Other

## 2015-02-20 ENCOUNTER — Encounter (HOSPITAL_COMMUNITY): Payer: Medicare Other | Attending: Hematology & Oncology

## 2015-02-20 DIAGNOSIS — D649 Anemia, unspecified: Secondary | ICD-10-CM

## 2015-02-20 DIAGNOSIS — C9290 Myeloid leukemia, unspecified, not having achieved remission: Secondary | ICD-10-CM | POA: Insufficient documentation

## 2015-02-20 DIAGNOSIS — C9210 Chronic myeloid leukemia, BCR/ABL-positive, not having achieved remission: Secondary | ICD-10-CM

## 2015-02-20 LAB — CBC WITH DIFFERENTIAL/PLATELET
Basophils Absolute: 0 K/uL (ref 0.0–0.1)
Basophils Relative: 0 % (ref 0–1)
Eosinophils Absolute: 0.1 K/uL (ref 0.0–0.7)
Eosinophils Relative: 3 % (ref 0–5)
HCT: 34.6 % — ABNORMAL LOW (ref 36.0–46.0)
Hemoglobin: 10.9 g/dL — ABNORMAL LOW (ref 12.0–15.0)
Lymphocytes Relative: 20 % (ref 12–46)
Lymphs Abs: 0.8 K/uL (ref 0.7–4.0)
MCH: 33.4 pg (ref 26.0–34.0)
MCHC: 31.5 g/dL (ref 30.0–36.0)
MCV: 106.1 fL — ABNORMAL HIGH (ref 78.0–100.0)
Monocytes Absolute: 0.3 K/uL (ref 0.1–1.0)
Monocytes Relative: 6 % (ref 3–12)
Neutro Abs: 2.8 K/uL (ref 1.7–7.7)
Neutrophils Relative %: 71 % (ref 43–77)
Platelets: 98 K/uL — ABNORMAL LOW (ref 150–400)
RBC: 3.26 MIL/uL — ABNORMAL LOW (ref 3.87–5.11)
RDW: 16.1 % — ABNORMAL HIGH (ref 11.5–15.5)
WBC: 3.9 K/uL — ABNORMAL LOW (ref 4.0–10.5)

## 2015-02-20 LAB — COMPREHENSIVE METABOLIC PANEL WITH GFR
ALT: 18 U/L (ref 14–54)
AST: 33 U/L (ref 15–41)
Albumin: 3.7 g/dL (ref 3.5–5.0)
Alkaline Phosphatase: 71 U/L (ref 38–126)
Anion gap: 5 (ref 5–15)
BUN: 18 mg/dL (ref 6–20)
CO2: 24 mmol/L (ref 22–32)
Calcium: 8.7 mg/dL — ABNORMAL LOW (ref 8.9–10.3)
Chloride: 116 mmol/L — ABNORMAL HIGH (ref 101–111)
Creatinine, Ser: 1.52 mg/dL — ABNORMAL HIGH (ref 0.44–1.00)
GFR calc Af Amer: 38 mL/min — ABNORMAL LOW (ref >60)
GFR calc non Af Amer: 33 mL/min — ABNORMAL LOW (ref >60)
Glucose, Bld: 131 mg/dL — ABNORMAL HIGH (ref 65–99)
Potassium: 3.9 mmol/L (ref 3.5–5.1)
Sodium: 145 mmol/L (ref 135–145)
Total Bilirubin: 0.4 mg/dL (ref 0.3–1.2)
Total Protein: 5.6 g/dL — ABNORMAL LOW (ref 6.5–8.1)

## 2015-02-20 LAB — IRON AND TIBC
Iron: 76 ug/dL (ref 28–170)
Saturation Ratios: 29 % (ref 10.4–31.8)
TIBC: 262 ug/dL (ref 250–450)
UIBC: 186 ug/dL

## 2015-02-20 LAB — FERRITIN: Ferritin: 109 ng/mL (ref 11–307)

## 2015-03-02 DIAGNOSIS — M5416 Radiculopathy, lumbar region: Secondary | ICD-10-CM | POA: Diagnosis not present

## 2015-03-02 DIAGNOSIS — M545 Low back pain: Secondary | ICD-10-CM | POA: Diagnosis not present

## 2015-03-02 DIAGNOSIS — M47816 Spondylosis without myelopathy or radiculopathy, lumbar region: Secondary | ICD-10-CM | POA: Diagnosis not present

## 2015-03-04 DIAGNOSIS — M5416 Radiculopathy, lumbar region: Secondary | ICD-10-CM | POA: Diagnosis not present

## 2015-03-04 DIAGNOSIS — M4806 Spinal stenosis, lumbar region: Secondary | ICD-10-CM | POA: Diagnosis not present

## 2015-03-12 DIAGNOSIS — N19 Unspecified kidney failure: Secondary | ICD-10-CM | POA: Diagnosis not present

## 2015-03-12 DIAGNOSIS — C9290 Myeloid leukemia, unspecified, not having achieved remission: Secondary | ICD-10-CM | POA: Diagnosis not present

## 2015-03-12 DIAGNOSIS — I10 Essential (primary) hypertension: Secondary | ICD-10-CM | POA: Diagnosis not present

## 2015-03-12 DIAGNOSIS — R634 Abnormal weight loss: Secondary | ICD-10-CM | POA: Diagnosis not present

## 2015-04-06 ENCOUNTER — Encounter (HOSPITAL_COMMUNITY): Payer: Medicare Other

## 2015-04-06 ENCOUNTER — Other Ambulatory Visit (HOSPITAL_COMMUNITY): Payer: Self-pay | Admitting: Pulmonary Disease

## 2015-04-06 DIAGNOSIS — Z1231 Encounter for screening mammogram for malignant neoplasm of breast: Secondary | ICD-10-CM

## 2015-04-07 ENCOUNTER — Encounter (HOSPITAL_COMMUNITY): Payer: Medicare Other

## 2015-04-08 ENCOUNTER — Encounter (HOSPITAL_COMMUNITY): Payer: Medicare Other

## 2015-04-08 ENCOUNTER — Ambulatory Visit (HOSPITAL_COMMUNITY): Payer: Medicare Other | Admitting: Hematology & Oncology

## 2015-04-14 ENCOUNTER — Other Ambulatory Visit (HOSPITAL_COMMUNITY): Payer: Self-pay | Admitting: Oncology

## 2015-04-14 DIAGNOSIS — C9210 Chronic myeloid leukemia, BCR/ABL-positive, not having achieved remission: Secondary | ICD-10-CM

## 2015-04-14 MED ORDER — IMATINIB MESYLATE 400 MG PO TABS: ORAL_TABLET | ORAL | Status: DC

## 2015-04-15 ENCOUNTER — Encounter (HOSPITAL_COMMUNITY): Payer: Medicare Other | Attending: Hematology & Oncology

## 2015-04-15 DIAGNOSIS — C9210 Chronic myeloid leukemia, BCR/ABL-positive, not having achieved remission: Secondary | ICD-10-CM

## 2015-04-15 DIAGNOSIS — C9290 Myeloid leukemia, unspecified, not having achieved remission: Secondary | ICD-10-CM | POA: Insufficient documentation

## 2015-04-15 DIAGNOSIS — C9211 Chronic myeloid leukemia, BCR/ABL-positive, in remission: Secondary | ICD-10-CM | POA: Diagnosis not present

## 2015-04-15 LAB — COMPREHENSIVE METABOLIC PANEL
ALT: 20 U/L (ref 14–54)
AST: 44 U/L — ABNORMAL HIGH (ref 15–41)
Albumin: 4.2 g/dL (ref 3.5–5.0)
Alkaline Phosphatase: 49 U/L (ref 38–126)
Anion gap: 4 — ABNORMAL LOW (ref 5–15)
BUN: 18 mg/dL (ref 6–20)
CHLORIDE: 113 mmol/L — AB (ref 101–111)
CO2: 24 mmol/L (ref 22–32)
Calcium: 8.3 mg/dL — ABNORMAL LOW (ref 8.9–10.3)
Creatinine, Ser: 1.67 mg/dL — ABNORMAL HIGH (ref 0.44–1.00)
GFR, EST AFRICAN AMERICAN: 34 mL/min — AB (ref >60)
GFR, EST NON AFRICAN AMERICAN: 29 mL/min — AB (ref >60)
Glucose, Bld: 95 mg/dL (ref 65–99)
POTASSIUM: 3.7 mmol/L (ref 3.5–5.1)
SODIUM: 141 mmol/L (ref 135–145)
Total Bilirubin: 0.5 mg/dL (ref 0.3–1.2)
Total Protein: 6.5 g/dL (ref 6.5–8.1)

## 2015-04-15 LAB — CBC WITH DIFFERENTIAL/PLATELET
Basophils Absolute: 0 10*3/uL (ref 0.0–0.1)
Basophils Relative: 1 %
EOS ABS: 0.1 10*3/uL (ref 0.0–0.7)
EOS PCT: 1 %
HCT: 35.5 % — ABNORMAL LOW (ref 36.0–46.0)
Hemoglobin: 11.5 g/dL — ABNORMAL LOW (ref 12.0–15.0)
LYMPHS ABS: 0.8 10*3/uL (ref 0.7–4.0)
Lymphocytes Relative: 21 %
MCH: 34 pg (ref 26.0–34.0)
MCHC: 32.4 g/dL (ref 30.0–36.0)
MCV: 105 fL — ABNORMAL HIGH (ref 78.0–100.0)
MONO ABS: 0.2 10*3/uL (ref 0.1–1.0)
Monocytes Relative: 6 %
Neutro Abs: 2.7 10*3/uL (ref 1.7–7.7)
Neutrophils Relative %: 72 %
PLATELETS: 97 10*3/uL — AB (ref 150–400)
RBC: 3.38 MIL/uL — ABNORMAL LOW (ref 3.87–5.11)
RDW: 15.2 % (ref 11.5–15.5)
WBC: 3.8 10*3/uL — AB (ref 4.0–10.5)

## 2015-04-16 NOTE — Progress Notes (Signed)
Labs drawn

## 2015-04-20 LAB — BCR-ABL1, CML/ALL, PCR, QUANT

## 2015-04-21 ENCOUNTER — Encounter (HOSPITAL_COMMUNITY): Payer: Medicare Other | Attending: Hematology & Oncology | Admitting: Hematology & Oncology

## 2015-04-21 ENCOUNTER — Encounter (HOSPITAL_COMMUNITY): Payer: Self-pay | Admitting: Hematology & Oncology

## 2015-04-21 VITALS — BP 115/40 | HR 55 | Temp 98.4°F | Resp 16 | Wt 119.4 lb

## 2015-04-21 DIAGNOSIS — G43909 Migraine, unspecified, not intractable, without status migrainosus: Secondary | ICD-10-CM | POA: Diagnosis not present

## 2015-04-21 DIAGNOSIS — D7589 Other specified diseases of blood and blood-forming organs: Secondary | ICD-10-CM

## 2015-04-21 DIAGNOSIS — C9210 Chronic myeloid leukemia, BCR/ABL-positive, not having achieved remission: Secondary | ICD-10-CM

## 2015-04-21 DIAGNOSIS — N189 Chronic kidney disease, unspecified: Secondary | ICD-10-CM

## 2015-04-21 MED ORDER — ONDANSETRON HCL 4 MG PO TABS: 4.0000 mg | ORAL_TABLET | Freq: Three times a day (TID) | ORAL | Status: DC | PRN

## 2015-04-21 NOTE — Patient Instructions (Signed)
.  [place] at [facility] Discharge Instructions  RECOMMENDATIONS MADE BY THE CONSULTANT AND ANY TEST RESULTS WILL BE SENT TO YOUR REFERRING PHYSICIAN.  Exam and discussion by Dr. Whitney Muse Call with any concerns or issues.  Follow-up in 6 months with labs and office visit.  Thank you for choosing [place] at [facility] to provide your oncology and hematology care.  To afford each patient quality time with our provider, please arrive at least 15 minutes before your scheduled appointment time.    You need to re-schedule your appointment should you arrive 10 or more minutes late.  We strive to give you quality time with our providers, and arriving late affects you and other patients whose appointments are after yours.  Also, if you no show three or more times for appointments you may be dismissed from the clinic at the providers discretion.     Again, thank you for choosing [facility].  Our hope is that these requests will decrease the amount of time that you wait before being seen by our physicians.       _____________________________________________________________  Should you have questions after your visit to [facility], please contact our office at [phone] between the hours of 8:30 a.m. and 4:30 p.m.  Voicemails left after 4:30 p.m. will not be returned until the following business day.  For prescription refill requests, have your pharmacy contact our office.

## 2015-04-21 NOTE — Progress Notes (Signed)
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 [name]  CURRENT THERAPY: Gleevac 400 mg daily  INTERVAL HISTORY: Ana Nelson 74 y.o. female returns for followup of [place] chromosome positive, presenting here in September 2000 still with a major molecular response on Gleevec.  I personally reviewed and went over laboratory results with the patient.  The results are noted within this dictation.     She denies any symptoms including B. symptoms, fevers, chills, night sweats, unintentional weight loss.  Hematologically, she denies any complaints and ROS questioning is negative.   The patient complains of having continuous migraines.  She experiences nausea along with these.  She notes that Phenergan does not work for her.  This is monitored by Dr. [name].  Her nephrologist is Dr. [name].  She only has 1 kidney.  She has a mammogram scheduled on 05/10/15.  She will be receiving her flu vaccination next week from her Primary.    She takes her gleevec daily. She is very active and travels some. She loves Elvis Presley and follows Elvis impersonators!  Past Medical History  Diagnosis Date  . Osteoarthritis   . CML (chronic myelocytic leukemia) ([place])   . History of migraine headaches   . Right knee pain   . Injury of left shoulder   . Blindness of right eye   . Drooping eyelid     left eye  . CML (chronic myelocytic leukemia) ([place]) 05/04/2012  . Shingles     has CML (chronic myelocytic leukemia) ([place]) on her problem list.     is allergic to nubain; perphenazine; ibuprofen; tylenol; and vistaril.  Ms. [name] does not currently have medications on file.  Past Surgical History  Procedure Laterality Date  . Bowel blockage surgery    . Right kidney removed  1980  . Bone marrow bx & asp    . Rt. cataract surgery      Denies any headaches, dizziness, double  vision, fevers, chills, night sweats, nausea, vomiting, diarrhea, constipation, chest pain, heart palpitations, shortness of breath, blood in stool, black tarry stool, urinary pain, urinary burning, urinary frequency, hematuria. 14 point review of systems was performed and is negative except as detailed under history of present illness and above    PHYSICAL EXAMINATION  ECOG PERFORMANCE STATUS: 0 - Asymptomatic  Filed Vitals:   04/21/15 1000  BP: 115/40  Pulse: 55  Temp: 98.4 F (36.9 C)  Resp: 16    GENERAL:alert, no distress, well nourished, well developed, comfortable, cooperative and smiling SKIN: skin color, texture, turgor are normal, no rashes or significant lesions HEAD: Normocephalic, No masses, lesions, tenderness or abnormalities EYES: normal, PERRLA, EOMI, Conjunctiva are pink and non-injected EARS: External ears normal OROPHARYNX:lips, buccal mucosa, and tongue normal and mucous membranes are moist  NECK: supple, no adenopathy, thyroid normal size, non-tender, without nodularity, no stridor, non-tender, trachea midline LYMPH:  no palpable lymphadenopathy, no hepatosplenomegaly BREAST:breasts appear normal, no suspicious masses, no skin or nipple changes or axillary nodes LUNGS: clear to auscultation and percussion HEART: regular rate & rhythm, no murmurs, no gallops, S1 normal and S2 normal ABDOMEN:abdomen soft, non-tender, normal bowel sounds, no masses or organomegaly and no hepatosplenomegaly BACK: Back symmetric, no curvature., No CVA tenderness EXTREMITIES:less then 2 second capillary refill, no joint deformities, effusion, or inflammation, no edema, no skin discoloration, no clubbing, no cyanosis  NEURO: alert &  oriented x 3 with fluent speech, no focal motor/sensory deficits, gait normal   LABORATORY DATA: I have reviewed the data below as listed. CBC    Component Value Date/Time   WBC 3.8* 04/15/2015 1303   RBC 3.38* 04/15/2015 1303   HGB 11.5* 04/15/2015  1303   HCT 35.5* 04/15/2015 1303   PLT 97* 04/15/2015 1303   MCV 105.0* 04/15/2015 1303   MCH 34.0 04/15/2015 1303   MCHC 32.4 04/15/2015 1303   RDW 15.2 04/15/2015 1303   LYMPHSABS 0.8 04/15/2015 1303   MONOABS 0.2 04/15/2015 1303   EOSABS 0.1 04/15/2015 1303   BASOSABS 0.0 04/15/2015 1303      Chemistry      Component Value Date/Time   NA 141 04/15/2015 1303   K 3.7 04/15/2015 1303   CL 113* 04/15/2015 1303   CO2 24 04/15/2015 1303   BUN 18 04/15/2015 1303   CREATININE 1.67* 04/15/2015 1303      Component Value Date/Time   CALCIUM 8.3* 04/15/2015 1303   ALKPHOS 49 04/15/2015 1303   AST 44* 04/15/2015 1303   ALT 20 04/15/2015 1303   BILITOT 0.5 04/15/2015 1303       ASSESSMENT AND PLAN:  CML diagnosed in 2000, 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 [name] Macrocytosis   Continue daily Gleevec.  Report signs and symptoms related to [facility] as she is well educated on B symptoms.  We will continue to monitor from progression of disease.  I am requesting the patient return for B12 and folate. This have not been checked in several years and she is macrocytic.   She was given a prescription for 4 mg Zofran for her nausea. I have advised her to use sparingly.  The patient is still in molecular remission.  Follow up in 41mo Note that I did address the following issue below regarding kidney dysfunction with long term gleevec use: Currently I would not make any changes to her medication. She will continue to follow with Dr. BHinda Lenis  Imatinib and Risk of Kidney Function Decline Safety Review March 2016  A Health CSan Marinosafety review concluded there was sufficient evidence to consider a potential link between [employer] GBuckeye(imatinib) and a decline in kidney function during long-term treatment. Health CSan Marinoreviewed 30 reports of decreased or abnormal kidney function linked with imatinib and [employer] found an additional 63 reports worldwide of decreased kidney function linked  with imatinib. To reduce this risk, Health CSan Marinois working with [employer] include the following safety information in the product monograph: Long-term treatment with GSharonmay result in decline in renal function. Patients treated with imatinib in clinical studies had a decrease over time in estimated glomerular filtration rate (eGFR). Monitoring for renal function should be undertaken before initiating therapy and periodically thereafter. Further information can be found at: http://[email or web address].[email or web address]/dhp-mps/medeff/reviews-examens/gleevec-eng.[email or web address]  All questions were answered. The patient knows to call the clinic with any problems, questions or concerns. We can certainly see the patient much sooner if necessary.   This document serves as a record of services personally performed by SAncil Linsey MD. It was created on her behalf by DJanace Hoard a trained medical scribe. The creation of this record is based on the scribe's personal observations and the provider's statements to them. This document has been checked and approved by the attending provider.  I have reviewed the above documentation for accuracy and completeness, and I agree with the above.  This note is electronically signed.  SKelby Fam PWhitney Muse MD

## 2015-04-22 ENCOUNTER — Telehealth (HOSPITAL_COMMUNITY): Payer: Self-pay

## 2015-04-22 ENCOUNTER — Encounter (HOSPITAL_COMMUNITY): Payer: Self-pay | Admitting: Hematology & Oncology

## 2015-04-22 ENCOUNTER — Other Ambulatory Visit (HOSPITAL_COMMUNITY): Payer: Self-pay

## 2015-04-22 DIAGNOSIS — C9210 Chronic myeloid leukemia, BCR/ABL-positive, not having achieved remission: Secondary | ICD-10-CM

## 2015-04-22 NOTE — Telephone Encounter (Signed)
-----   Message from Patrici Ranks, MD sent at 04/22/2015  8:06 AM EDT -----  Please have patient come in for B12 and folate labs. Dr.P

## 2015-04-22 NOTE — Telephone Encounter (Signed)
Notified and will come on 04/24/15.

## 2015-04-24 ENCOUNTER — Other Ambulatory Visit (HOSPITAL_COMMUNITY): Payer: Self-pay | Admitting: Hematology & Oncology

## 2015-04-24 ENCOUNTER — Encounter (HOSPITAL_BASED_OUTPATIENT_CLINIC_OR_DEPARTMENT_OTHER): Payer: Medicare Other

## 2015-04-24 DIAGNOSIS — C9210 Chronic myeloid leukemia, BCR/ABL-positive, not having achieved remission: Secondary | ICD-10-CM

## 2015-04-24 LAB — FOLATE: Folate: 13.9 ng/mL (ref >5.9)

## 2015-04-24 LAB — VITAMIN B12: Vitamin B-12: 214 pg/mL (ref 180–914)

## 2015-04-24 NOTE — Progress Notes (Signed)
LABS DRAWN

## 2015-04-27 ENCOUNTER — Telehealth (HOSPITAL_COMMUNITY): Payer: Self-pay | Admitting: *Deleted

## 2015-04-27 NOTE — Telephone Encounter (Signed)
Patient notified that we will do weekly b12 injections then she is to start OTC b12 tablets daily.

## 2015-04-28 ENCOUNTER — Ambulatory Visit (HOSPITAL_COMMUNITY): Payer: Medicare Other

## 2015-04-28 DIAGNOSIS — R809 Proteinuria, unspecified: Secondary | ICD-10-CM | POA: Diagnosis not present

## 2015-04-28 DIAGNOSIS — Z79899 Other long term (current) drug therapy: Secondary | ICD-10-CM | POA: Diagnosis not present

## 2015-04-28 DIAGNOSIS — D649 Anemia, unspecified: Secondary | ICD-10-CM | POA: Diagnosis not present

## 2015-04-28 DIAGNOSIS — E559 Vitamin D deficiency, unspecified: Secondary | ICD-10-CM | POA: Diagnosis not present

## 2015-04-28 DIAGNOSIS — D509 Iron deficiency anemia, unspecified: Secondary | ICD-10-CM | POA: Diagnosis not present

## 2015-04-28 DIAGNOSIS — N183 Chronic kidney disease, stage 3 (moderate): Secondary | ICD-10-CM | POA: Diagnosis not present

## 2015-04-28 MED ORDER — CYANOCOBALAMIN 1000 MCG/ML IJ SOLN: 1000.0000 ug | INTRAMUSCULAR | Status: DC

## 2015-05-05 DIAGNOSIS — D649 Anemia, unspecified: Secondary | ICD-10-CM | POA: Diagnosis not present

## 2015-05-05 DIAGNOSIS — I10 Essential (primary) hypertension: Secondary | ICD-10-CM | POA: Diagnosis not present

## 2015-05-05 DIAGNOSIS — E559 Vitamin D deficiency, unspecified: Secondary | ICD-10-CM | POA: Diagnosis not present

## 2015-05-05 DIAGNOSIS — N2581 Secondary hyperparathyroidism of renal origin: Secondary | ICD-10-CM | POA: Diagnosis not present

## 2015-05-05 DIAGNOSIS — N183 Chronic kidney disease, stage 3 (moderate): Secondary | ICD-10-CM | POA: Diagnosis not present

## 2015-05-19 DIAGNOSIS — Z23 Encounter for immunization: Secondary | ICD-10-CM | POA: Diagnosis not present

## 2015-05-19 DIAGNOSIS — I10 Essential (primary) hypertension: Secondary | ICD-10-CM | POA: Diagnosis not present

## 2015-05-19 DIAGNOSIS — M545 Low back pain: Secondary | ICD-10-CM | POA: Diagnosis not present

## 2015-05-19 DIAGNOSIS — G43909 Migraine, unspecified, not intractable, without status migrainosus: Secondary | ICD-10-CM | POA: Diagnosis not present

## 2015-05-20 ENCOUNTER — Ambulatory Visit (HOSPITAL_COMMUNITY): Payer: Medicare Other

## 2015-05-21 DIAGNOSIS — M1711 Unilateral primary osteoarthritis, right knee: Secondary | ICD-10-CM | POA: Diagnosis not present

## 2015-05-21 DIAGNOSIS — M25561 Pain in right knee: Secondary | ICD-10-CM | POA: Diagnosis not present

## 2015-05-27 ENCOUNTER — Ambulatory Visit (HOSPITAL_COMMUNITY)
Admission: RE | Admit: 2015-05-27 | Discharge: 2015-05-27 | Disposition: A | Discharge status: Home / Self Care | Payer: Medicare Other | Source: Ambulatory Visit | Attending: Pulmonary Disease | Admitting: Pulmonary Disease

## 2015-05-27 DIAGNOSIS — Z1231 Encounter for screening mammogram for malignant neoplasm of breast: Secondary | ICD-10-CM | POA: Diagnosis not present

## 2015-06-01 ENCOUNTER — Other Ambulatory Visit: Payer: Self-pay | Admitting: Pulmonary Disease

## 2015-06-01 DIAGNOSIS — R928 Other abnormal and inconclusive findings on diagnostic imaging of breast: Secondary | ICD-10-CM

## 2015-06-16 ENCOUNTER — Ambulatory Visit (HOSPITAL_COMMUNITY)
Admission: RE | Admit: 2015-06-16 | Discharge: 2015-06-16 | Disposition: A | Discharge status: Home / Self Care | Payer: Medicare Other | Source: Ambulatory Visit | Attending: Pulmonary Disease | Admitting: Pulmonary Disease

## 2015-06-16 DIAGNOSIS — R928 Other abnormal and inconclusive findings on diagnostic imaging of breast: Secondary | ICD-10-CM | POA: Insufficient documentation

## 2015-07-10 ENCOUNTER — Telehealth (HOSPITAL_COMMUNITY): Payer: Self-pay

## 2015-07-10 ENCOUNTER — Other Ambulatory Visit (HOSPITAL_COMMUNITY): Payer: Self-pay | Admitting: Oncology

## 2015-07-10 DIAGNOSIS — Z0000 Encounter for general adult medical examination without abnormal findings: Secondary | ICD-10-CM

## 2015-07-10 DIAGNOSIS — C9210 Chronic myeloid leukemia, BCR/ABL-positive, not having achieved remission: Secondary | ICD-10-CM

## 2015-07-10 NOTE — Telephone Encounter (Signed)
-----   Message from Baird Cancer, PA-C sent at 07/10/2015  5:01 PM EST ----- Yes!  Prevnar 13 this year, then PPSV23 next year.  Orders signed and held  TK ----- Message -----    From: Mellissa Kohut, RN    Sent: 07/10/2015   4:49 PM      To: Baird Cancer, PA-C    ----- Message -----    From: Epifanio Lesches    Sent: 07/10/2015   3:26 PM      To: Onc Nurse Ap  Wants to know it she should take a pneumonia vac?

## 2015-07-10 NOTE — Telephone Encounter (Signed)
Patient notified and plans to get it through her pharmacy.  Instructed as to what she needs to get and when.  Verbalized understanding of instructions.

## 2015-08-03 DIAGNOSIS — M25561 Pain in right knee: Secondary | ICD-10-CM | POA: Diagnosis not present

## 2015-08-03 DIAGNOSIS — M25512 Pain in left shoulder: Secondary | ICD-10-CM | POA: Diagnosis not present

## 2015-08-04 DIAGNOSIS — N183 Chronic kidney disease, stage 3 (moderate): Secondary | ICD-10-CM | POA: Diagnosis not present

## 2015-08-04 DIAGNOSIS — Z79899 Other long term (current) drug therapy: Secondary | ICD-10-CM | POA: Diagnosis not present

## 2015-08-04 DIAGNOSIS — I10 Essential (primary) hypertension: Secondary | ICD-10-CM | POA: Diagnosis not present

## 2015-08-04 DIAGNOSIS — R809 Proteinuria, unspecified: Secondary | ICD-10-CM | POA: Diagnosis not present

## 2015-08-04 DIAGNOSIS — D509 Iron deficiency anemia, unspecified: Secondary | ICD-10-CM | POA: Diagnosis not present

## 2015-08-04 DIAGNOSIS — E559 Vitamin D deficiency, unspecified: Secondary | ICD-10-CM | POA: Diagnosis not present

## 2015-08-05 ENCOUNTER — Other Ambulatory Visit (HOSPITAL_COMMUNITY): Payer: Self-pay | Admitting: Orthopedic Surgery

## 2015-08-05 DIAGNOSIS — M79602 Pain in left arm: Secondary | ICD-10-CM

## 2015-08-11 DIAGNOSIS — N183 Chronic kidney disease, stage 3 (moderate): Secondary | ICD-10-CM | POA: Diagnosis not present

## 2015-08-11 DIAGNOSIS — I10 Essential (primary) hypertension: Secondary | ICD-10-CM | POA: Diagnosis not present

## 2015-08-11 DIAGNOSIS — D649 Anemia, unspecified: Secondary | ICD-10-CM | POA: Diagnosis not present

## 2015-08-11 DIAGNOSIS — N2581 Secondary hyperparathyroidism of renal origin: Secondary | ICD-10-CM | POA: Diagnosis not present

## 2015-08-14 ENCOUNTER — Other Ambulatory Visit (HOSPITAL_COMMUNITY): Payer: Medicare Other

## 2015-08-17 DIAGNOSIS — M4806 Spinal stenosis, lumbar region: Secondary | ICD-10-CM | POA: Diagnosis not present

## 2015-08-19 ENCOUNTER — Other Ambulatory Visit (HOSPITAL_COMMUNITY)
Admission: AD | Admit: 2015-08-19 | Discharge: 2015-08-19 | Disposition: A | Discharge status: Home / Self Care | Payer: Medicare Other | Source: Ambulatory Visit | Attending: Pulmonary Disease | Admitting: Pulmonary Disease

## 2015-08-19 DIAGNOSIS — K921 Melena: Secondary | ICD-10-CM | POA: Diagnosis not present

## 2015-08-19 DIAGNOSIS — K625 Hemorrhage of anus and rectum: Secondary | ICD-10-CM | POA: Insufficient documentation

## 2015-08-19 DIAGNOSIS — C9290 Myeloid leukemia, unspecified, not having achieved remission: Secondary | ICD-10-CM | POA: Diagnosis not present

## 2015-08-19 DIAGNOSIS — K210 Gastro-esophageal reflux disease with esophagitis: Secondary | ICD-10-CM | POA: Diagnosis not present

## 2015-08-19 DIAGNOSIS — I10 Essential (primary) hypertension: Secondary | ICD-10-CM | POA: Diagnosis not present

## 2015-08-19 LAB — CBC
HCT: 29.1 % — ABNORMAL LOW (ref 36.0–46.0)
Hemoglobin: 9.7 g/dL — ABNORMAL LOW (ref 12.0–15.0)
MCH: 34.4 pg — AB (ref 26.0–34.0)
MCHC: 33.3 g/dL (ref 30.0–36.0)
MCV: 103.2 fL — AB (ref 78.0–100.0)
PLATELETS: 99 10*3/uL — AB (ref 150–400)
RBC: 2.82 MIL/uL — AB (ref 3.87–5.11)
RDW: 15.7 % — ABNORMAL HIGH (ref 11.5–15.5)
WBC: 8.1 10*3/uL (ref 4.0–10.5)

## 2015-08-21 ENCOUNTER — Other Ambulatory Visit (HOSPITAL_COMMUNITY): Payer: Self-pay | Admitting: Orthopedic Surgery

## 2015-08-21 DIAGNOSIS — M542 Cervicalgia: Secondary | ICD-10-CM

## 2015-08-24 ENCOUNTER — Other Ambulatory Visit (HOSPITAL_COMMUNITY): Payer: Self-pay | Admitting: Pulmonary Disease

## 2015-08-24 DIAGNOSIS — R1030 Lower abdominal pain, unspecified: Secondary | ICD-10-CM

## 2015-08-24 DIAGNOSIS — K625 Hemorrhage of anus and rectum: Secondary | ICD-10-CM

## 2015-08-28 ENCOUNTER — Other Ambulatory Visit (HOSPITAL_COMMUNITY): Payer: Self-pay | Admitting: Pulmonary Disease

## 2015-08-28 ENCOUNTER — Ambulatory Visit (HOSPITAL_COMMUNITY)
Admission: RE | Admit: 2015-08-28 | Discharge: 2015-08-28 | Disposition: A | Discharge status: Home / Self Care | Payer: Medicare Other | Source: Ambulatory Visit | Attending: Pulmonary Disease | Admitting: Pulmonary Disease

## 2015-08-28 DIAGNOSIS — K625 Hemorrhage of anus and rectum: Secondary | ICD-10-CM

## 2015-08-28 DIAGNOSIS — Z905 Acquired absence of kidney: Secondary | ICD-10-CM | POA: Diagnosis not present

## 2015-08-28 DIAGNOSIS — R109 Unspecified abdominal pain: Secondary | ICD-10-CM | POA: Diagnosis not present

## 2015-08-28 DIAGNOSIS — R1030 Lower abdominal pain, unspecified: Secondary | ICD-10-CM

## 2015-08-28 DIAGNOSIS — R601 Generalized edema: Secondary | ICD-10-CM | POA: Diagnosis not present

## 2015-08-28 DIAGNOSIS — I700 Atherosclerosis of aorta: Secondary | ICD-10-CM | POA: Insufficient documentation

## 2015-08-28 LAB — POCT I-STAT CREATININE: Creatinine, Ser: 1.7 mg/dL — ABNORMAL HIGH (ref 0.44–1.00)

## 2015-09-03 ENCOUNTER — Ambulatory Visit (HOSPITAL_COMMUNITY): Payer: Medicare Other

## 2015-09-14 DIAGNOSIS — Z0000 Encounter for general adult medical examination without abnormal findings: Secondary | ICD-10-CM | POA: Diagnosis not present

## 2015-09-16 DIAGNOSIS — K921 Melena: Secondary | ICD-10-CM | POA: Diagnosis not present

## 2015-09-16 DIAGNOSIS — E785 Hyperlipidemia, unspecified: Secondary | ICD-10-CM | POA: Diagnosis not present

## 2015-09-16 DIAGNOSIS — K210 Gastro-esophageal reflux disease with esophagitis: Secondary | ICD-10-CM | POA: Diagnosis not present

## 2015-09-16 DIAGNOSIS — I10 Essential (primary) hypertension: Secondary | ICD-10-CM | POA: Diagnosis not present

## 2015-09-16 DIAGNOSIS — Z0000 Encounter for general adult medical examination without abnormal findings: Secondary | ICD-10-CM | POA: Diagnosis not present

## 2015-10-13 ENCOUNTER — Other Ambulatory Visit (HOSPITAL_COMMUNITY): Payer: Medicare Other

## 2015-10-14 ENCOUNTER — Other Ambulatory Visit (HOSPITAL_COMMUNITY): Payer: Self-pay | Admitting: Oncology

## 2015-10-15 DIAGNOSIS — M1711 Unilateral primary osteoarthritis, right knee: Secondary | ICD-10-CM | POA: Diagnosis not present

## 2015-10-16 DIAGNOSIS — E039 Hypothyroidism, unspecified: Secondary | ICD-10-CM | POA: Diagnosis not present

## 2015-10-16 DIAGNOSIS — E0520 Thyrotoxicosis with toxic multinodular goiter without thyrotoxic crisis or storm: Secondary | ICD-10-CM | POA: Diagnosis not present

## 2015-10-16 DIAGNOSIS — D34 Benign neoplasm of thyroid gland: Secondary | ICD-10-CM | POA: Diagnosis not present

## 2015-10-20 ENCOUNTER — Encounter (HOSPITAL_COMMUNITY): Payer: Medicare Other | Attending: Hematology & Oncology

## 2015-10-20 ENCOUNTER — Ambulatory Visit (HOSPITAL_COMMUNITY): Payer: Medicare Other | Admitting: Oncology

## 2015-10-20 ENCOUNTER — Ambulatory Visit (HOSPITAL_COMMUNITY): Payer: Medicare Other | Admitting: Hematology & Oncology

## 2015-10-20 DIAGNOSIS — C9210 Chronic myeloid leukemia, BCR/ABL-positive, not having achieved remission: Secondary | ICD-10-CM | POA: Diagnosis not present

## 2015-10-20 LAB — CBC WITH DIFFERENTIAL/PLATELET
BASOS ABS: 0 10*3/uL (ref 0.0–0.1)
Basophils Relative: 0 %
EOS ABS: 0.1 10*3/uL (ref 0.0–0.7)
Eosinophils Relative: 2 %
HCT: 35.7 % — ABNORMAL LOW (ref 36.0–46.0)
Hemoglobin: 11.9 g/dL — ABNORMAL LOW (ref 12.0–15.0)
LYMPHS ABS: 0.9 10*3/uL (ref 0.7–4.0)
Lymphocytes Relative: 20 %
MCH: 34.4 pg — ABNORMAL HIGH (ref 26.0–34.0)
MCHC: 33.3 g/dL (ref 30.0–36.0)
MCV: 103.2 fL — ABNORMAL HIGH (ref 78.0–100.0)
MONO ABS: 0.4 10*3/uL (ref 0.1–1.0)
MONOS PCT: 9 %
NEUTROS ABS: 3.1 10*3/uL (ref 1.7–7.7)
Neutrophils Relative %: 69 %
PLATELETS: 103 10*3/uL — AB (ref 150–400)
RBC: 3.46 MIL/uL — AB (ref 3.87–5.11)
RDW: 14.9 % (ref 11.5–15.5)
WBC: 4.5 10*3/uL (ref 4.0–10.5)

## 2015-10-20 LAB — COMPREHENSIVE METABOLIC PANEL
ALBUMIN: 4 g/dL (ref 3.5–5.0)
ALT: 19 U/L (ref 14–54)
AST: 41 U/L (ref 15–41)
Alkaline Phosphatase: 57 U/L (ref 38–126)
Anion gap: 6 (ref 5–15)
BUN: 18 mg/dL (ref 6–20)
CHLORIDE: 106 mmol/L (ref 101–111)
CO2: 30 mmol/L (ref 22–32)
Calcium: 9 mg/dL (ref 8.9–10.3)
Creatinine, Ser: 1.51 mg/dL — ABNORMAL HIGH (ref 0.44–1.00)
GFR calc Af Amer: 38 mL/min — ABNORMAL LOW (ref >60)
GFR calc non Af Amer: 33 mL/min — ABNORMAL LOW (ref >60)
Glucose, Bld: 100 mg/dL — ABNORMAL HIGH (ref 65–99)
POTASSIUM: 3.7 mmol/L (ref 3.5–5.1)
SODIUM: 142 mmol/L (ref 135–145)
TOTAL PROTEIN: 6.1 g/dL — AB (ref 6.5–8.1)
Total Bilirubin: 0.4 mg/dL (ref 0.3–1.2)

## 2015-10-20 LAB — LACTATE DEHYDROGENASE: LDH: 245 U/L — AB (ref 98–192)

## 2015-10-26 LAB — BCR-ABL1, CML/ALL, PCR, QUANT

## 2015-10-27 ENCOUNTER — Other Ambulatory Visit (HOSPITAL_COMMUNITY): Payer: Medicare Other

## 2015-10-30 ENCOUNTER — Encounter (HOSPITAL_COMMUNITY): Payer: Self-pay | Admitting: Emergency Medicine

## 2015-10-30 ENCOUNTER — Emergency Department (HOSPITAL_COMMUNITY)
Admission: EM | Admit: 2015-10-30 | Discharge: 2015-10-30 | Disposition: A | Discharge status: Home / Self Care | Payer: Medicare Other | Attending: Emergency Medicine | Admitting: Emergency Medicine

## 2015-10-30 DIAGNOSIS — G43709 Chronic migraine without aura, not intractable, without status migrainosus: Secondary | ICD-10-CM | POA: Diagnosis not present

## 2015-10-30 DIAGNOSIS — Z79891 Long term (current) use of opiate analgesic: Secondary | ICD-10-CM | POA: Insufficient documentation

## 2015-10-30 DIAGNOSIS — Z79899 Other long term (current) drug therapy: Secondary | ICD-10-CM | POA: Insufficient documentation

## 2015-10-30 DIAGNOSIS — R519 Headache, unspecified: Secondary | ICD-10-CM

## 2015-10-30 DIAGNOSIS — R51 Headache: Secondary | ICD-10-CM | POA: Diagnosis not present

## 2015-10-30 DIAGNOSIS — M1990 Unspecified osteoarthritis, unspecified site: Secondary | ICD-10-CM | POA: Diagnosis not present

## 2015-10-30 MED ORDER — DIPHENHYDRAMINE HCL 25 MG PO CAPS
25.0000 mg | ORAL_CAPSULE | Freq: Once | ORAL | Status: AC
  Administered 2015-10-30: 25 mg via ORAL
  Filled 2015-10-30: qty 1

## 2015-10-30 MED ORDER — HYDROMORPHONE HCL 1 MG/ML IJ SOLN
1.0000 mg | Freq: Once | INTRAMUSCULAR | Status: AC
  Administered 2015-10-30: 1 mg via INTRAMUSCULAR
  Filled 2015-10-30: qty 1

## 2015-10-30 MED ORDER — HYDROMORPHONE HCL 1 MG/ML IJ SOLN
0.5000 mg | Freq: Once | INTRAMUSCULAR | Status: AC
  Administered 2015-10-30: 0.5 mg via INTRAMUSCULAR
  Filled 2015-10-30: qty 1

## 2015-10-30 MED ORDER — METOCLOPRAMIDE HCL 5 MG/ML IJ SOLN
5.0000 mg | Freq: Once | INTRAMUSCULAR | Status: AC
  Administered 2015-10-30: 5 mg via INTRAMUSCULAR
  Filled 2015-10-30: qty 2

## 2015-10-30 NOTE — Discharge Instructions (Signed)
Take your usual medications as previously directed.  Call your regular medical doctor on Monday to schedule a follow up appointment within the next 3  days.  Return to the Emergency Department immediately sooner if worsening.

## 2015-10-30 NOTE — ED Provider Notes (Signed)
CSN: [id number]     Arrival date & time 10/30/15  1418 History   First MD Initiated Contact with Patient 10/30/15 1547     Chief Complaint  Patient presents with  . Migraine     HPI Pt was seen at 1550. Per pt, c/o gradual onset and persistence of constant acute flair of her chronic migraine headache for the past 6 days.  Describes the headache as per her usual chronic migraine headache pain pattern "since I was 75 years old." Pt took her usual oxycodone and stadol without complete relief. Denies headache was sudden or maximal in onset or at any time.  Denies visual changes, no focal motor weakness, no tingling/numbness in extremities, no fevers, no neck pain, no rash.     Past Medical History  Diagnosis Date  . Osteoarthritis   . CML (chronic myelocytic leukemia) ([place])   . History of migraine headaches     "continuous migraines" per Heme/Onc note 04/2015  . Right knee pain   . Injury of left shoulder   . Blindness of right eye   . Drooping eyelid     left eye  . 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 Shingles    Past Surgical History  Procedure Laterality Date  . Bowel blockage surgery    . Right kidney removed  1980  . Bone marrow bx & asp    . Rt. cataract surgery     Family History  Problem Relation Age of Onset  . Cancer Maternal Uncle     bone  . Cancer Paternal Aunt     uterine  . Cancer Maternal Grandmother     breast  . Cancer Maternal Grandfather     throat  . Cancer Paternal Grandmother     leukemia  . Cancer Son     colon   Social History  Substance Use Topics  . Smoking status: Never Smoker   . Smokeless tobacco: Never Used  . Alcohol Use: No    Review of Systems ROS: Statement: All systems negative except as marked or noted in the HPI; Constitutional: Negative for fever and chills. ; ; Eyes: Negative for eye pain, redness and discharge. ; ; ENMT: Negative for ear pain, hoarseness, nasal congestion, sinus pressure and sore throat. ; ;  Cardiovascular: Negative for chest pain, palpitations, diaphoresis, dyspnea and peripheral edema. ; ; Respiratory: Negative for cough, wheezing and stridor. ; ; Gastrointestinal: +nausea. Negative for vomiting, diarrhea, abdominal pain, blood in stool, hematemesis, jaundice and rectal bleeding. . ; ; Genitourinary: Negative for dysuria, flank pain and hematuria. ; ; Musculoskeletal: Negative for back pain and neck pain. Negative for swelling and trauma.; ; Skin: Negative for pruritus, rash, abrasions, blisters, bruising and skin lesion.; ; Neuro: +chronic headache. Negative for lightheadedness and neck stiffness. Negative for weakness, altered level of consciousness , altered mental status, extremity weakness, paresthesias, involuntary movement, seizure and syncope.      Allergies  Nubain; Perphenazine; Ibuprofen; Tylenol; and Vistaril  Home Medications   Prior to Admission medications   Medication Sig Start Date End Date Taking? Authorizing Provider  ALPRAZolam Duanne Moron) 1 MG tablet Take 1 mg by mouth 3 (three) times daily as needed for sleep.   Yes Historical Provider, MD  butorphanol (STADOL) 10 MG/ML nasal spray Place 1 spray into the nose every 4 (four) hours as needed (migraines).   Yes Historical Provider, MD  Cyanocobalamin (B-12 PO) Take 1 tablet by mouth daily.   Yes Historical Provider, MD  gabapentin (NEURONTIN) 300 MG capsule Take 300 mg by mouth 2 (two) times daily.   Yes Historical Provider, MD  GARLIC PO Take 1 tablet by mouth daily.   Yes Historical Provider, MD  imatinib (GLEEVEC) 400 MG tablet TAKE 1 TABLET BY MOUTH DAILY WITH FOOD AND A LARGE GLASS OF WATER ALWAYS USE SECONDARY INS XL HEALTH ON THIS MEDICATION 10/14/15  Yes Baird Cancer, PA-C  levothyroxine (SYNTHROID, LEVOTHROID) 50 MCG tablet Take 50 mcg by mouth daily before breakfast.  09/22/15  Yes Historical Provider, MD  Omega-3 Fatty Acids (FISH OIL PO) Take 1 capsule by mouth daily.   Yes Historical Provider, MD   oxyCODONE (OXY IR/ROXICODONE) 5 MG immediate release tablet Take 5 mg by mouth 3 (three) times daily as needed for moderate pain or severe pain.  10/13/15  Yes Historical Provider, MD  potassium chloride (K-DUR) 10 MEQ tablet Take 1 tablet (10 mEq total) by mouth 2 (two) times daily. Patient taking differently: Take 10 mEq by mouth daily.  09/19/14  Yes Manon Hilding Kefalas, PA-C  promethazine (PHENERGAN) 25 MG tablet Take 25 mg by mouth every 6 (six) hours as needed for nausea or vomiting. Takes 1/2 tablet   Yes Historical Provider, MD  sertraline (ZOLOFT) 50 MG tablet Take 50 mg by mouth daily.   Yes Historical Provider, MD  topiramate (TOPAMAX) 200 MG tablet Take 200 mg by mouth 2 (two) times daily.   Yes Historical Provider, MD   BP 172/79 mmHg  Pulse 83  Temp(Src) 99.3 F (37.4 C) (Oral)  Resp 16  Ht 5\' 2"  (1.575 m)  Wt 115 lb (52.164 kg)  BMI 21.03 kg/m2  SpO2 99% Physical Exam  1555: Physical examination:  Nursing notes reviewed; Vital signs and O2 SAT reviewed;  Constitutional: Well developed, Well nourished, Well hydrated, In no acute distress; Head:  Normocephalic, atraumatic; Eyes: EOMI, PERRL, No scleral icterus; ENMT: Mouth and pharynx normal, Mucous membranes moist; Neck: Supple, Full range of motion, No lymphadenopathy; Cardiovascular: Regular rate and rhythm, No gallop; Respiratory: Breath sounds clear & equal bilaterally, No wheezes.  Speaking full sentences with ease, Normal respiratory effort/excursion; Chest: Nontender, Movement normal; Abdomen: Soft, Nontender, Nondistended, Normal bowel sounds; Genitourinary: No CVA tenderness; Extremities: Pulses normal, No tenderness, No edema, No calf edema or asymmetry.; Neuro: AA&Ox3, Major CN grossly intact. No facial droop. Speech clear. No gross focal motor or sensory deficits in extremities.; Skin: Color normal, Warm, Dry.   ED Course  Procedures (including critical care time) Labs Review  Imaging Review  I have personally  reviewed and evaluated these images and lab results as part of my medical decision-making.   EKG Interpretation None      MDM  MDM Reviewed: previous chart, nursing note and vitals     1725:  Pt feels improved after meds and wants to go home now. Pt already has narcotics for use at home, will not give any further rx; pt aware. Long hx of chronic headaches.  Pt endorses acute flair of her usual long standing chronic headache pain today, no change from her usual chronic pain pattern.  Pt encouraged to f/u with her PMD and Pain Management doctor for good continuity of care and control of her chronic headache pain.  Pt verb understanding.    Francine Graven, DO 11/02/15 0116

## 2015-10-30 NOTE — ED Notes (Signed)
Migraine since Saturday and sensitive to light and noise.  Rates pain 10/10.  Did not take any medication for pain today.

## 2015-11-02 ENCOUNTER — Encounter: Payer: Self-pay | Admitting: Pulmonary Disease

## 2015-11-03 ENCOUNTER — Ambulatory Visit (HOSPITAL_COMMUNITY): Payer: Medicare Other | Admitting: Oncology

## 2015-11-16 ENCOUNTER — Telehealth (HOSPITAL_COMMUNITY): Payer: Self-pay | Admitting: *Deleted

## 2015-11-16 NOTE — Telephone Encounter (Signed)
I think that documentation was before the BCR abl returned. Will call her again.

## 2015-11-16 NOTE — Telephone Encounter (Signed)
She wants to know labs results. How should I advise.

## 2015-11-16 NOTE — Telephone Encounter (Signed)
Stable.  It is documented that Ana Nelson called the patient and notified her.    TK

## 2015-11-18 ENCOUNTER — Ambulatory Visit (HOSPITAL_COMMUNITY): Payer: Medicare Other | Admitting: Oncology

## 2015-12-01 ENCOUNTER — Other Ambulatory Visit (HOSPITAL_COMMUNITY): Payer: Medicare Other

## 2015-12-04 ENCOUNTER — Encounter (HOSPITAL_COMMUNITY): Payer: Self-pay | Admitting: Oncology

## 2015-12-04 ENCOUNTER — Encounter (HOSPITAL_COMMUNITY): Payer: Medicare Other | Attending: Oncology | Admitting: Oncology

## 2015-12-04 VITALS — BP 130/92 | HR 56 | Temp 98.4°F | Resp 18 | Wt 119.0 lb

## 2015-12-04 DIAGNOSIS — C9210 Chronic myeloid leukemia, BCR/ABL-positive, not having achieved remission: Secondary | ICD-10-CM | POA: Insufficient documentation

## 2015-12-04 NOTE — Assessment & Plan Note (Addendum)
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    Labs in 3 months: CBC diff, CMET, B12.    Labs in 6 months: CBC diff, CMET, BCR/ABL, B12 and folate.   Compliance with Gleevac re-enforced.    She is currently on PO B12 replacement.  We briefly discussed her kidney dysfunction with long term gleevec use: Currently I would not make any changes to her medication. She will continue to follow with Dr. [name].  Imatinib and Risk of Kidney Function Decline Safety Review March 2016  A [facility] safety review concluded there was sufficient evidence to consider a potential link between [employer] [place] (imatinib) and a decline in kidney function during long-term treatment. [facility] reviewed 30 reports of decreased or abnormal kidney function linked with imatinib and Novartis found an additional 63 reports worldwide of decreased kidney function linked with imatinib. To reduce this risk, [facility] is working with [employer] to include the following safety information in the product monograph: Long-term treatment with [place] may result in decline in renal function. Patients treated with imatinib in clinical studies had a decrease over time in estimated glomerular filtration rate (eGFR). Monitoring for renal function should be undertaken before initiating therapy and periodically thereafter. Further information can be found at: http://www.hc-[place].gc.ca/dhp-mps/medeff/reviews-examens/gleevec-eng.php  Return in 6 months for follow-up.

## 2015-12-04 NOTE — Patient Instructions (Signed)
[place] at [facility] Discharge Instructions  RECOMMENDATIONS MADE BY THE CONSULTANT AND ANY TEST RESULTS WILL BE SENT TO YOUR REFERRING PHYSICIAN.  Exam and discussion today with Kirby Crigler, PA. Lab work in 3 months and 6 months. Return for office visit in 6 months.   Thank you for choosing [place] at [facility] to provide your oncology and hematology care.  To afford each patient quality time with our provider, please arrive at least 15 minutes before your scheduled appointment time.   Beginning January 23rd 2017 lab work for the [facility] will be done in the  Main lab at [facility] on 1st floor. If you have a lab appointment with the [place] please come in thru the  Main Entrance and check in at the main information desk  You need to re-schedule your appointment should you arrive 10 or more minutes late.  We strive to give you quality time with our providers, and arriving late affects you and other patients whose appointments are after yours.  Also, if you no show three or more times for appointments you may be dismissed from the clinic at the providers discretion.     Again, thank you for choosing [facility].  Our hope is that these requests will decrease the amount of time that you wait before being seen by our physicians.       _____________________________________________________________  Should you have questions after your visit to [facility], please contact our office at [phone] between the hours of 8:30 a.m. and 4:30 p.m.  Voicemails left after 4:30 p.m. will not be returned until the following business day.  For prescription refill requests, have your pharmacy contact our office.         Resources For Cancer Patients and their Caregivers ? American Cancer Society: Can assist with transportation, wigs, general needs, runs Look Good Feel Better.        701-357-3138 ? Cancer  Care: Provides financial assistance, online support groups, medication/co-pay assistance.  1-800-813-HOPE 484-589-4518) ? [place] Assists [place] cancer patients and their families through emotional , educational and financial support.  209-153-9161 ? [place] DSS Where to apply for food stamps, Medicaid and utility assistance. (361)803-7577 ? [facility]: Transportation to medical appointments. (878)666-7402 ? Social Security Administration: May apply for disability if have a Stage IV cancer. 819-236-0356 (813)481-5137 ? [employer], Disability and Transit Services: Assists with nutrition, care and transit needs. [place] Support Programs: @10RELATIVEDAYS @ > Cancer Support Group  2nd Tuesday of the month 1pm-2pm, Journey Room  > Creative Journey  3rd Tuesday of the month 1130am-1pm, Journey Room  > Look Good Feel Better  1st Wednesday of the month 10am-12 noon, Journey Room (Call [place] to register [phone])

## 2015-12-04 NOTE — Progress Notes (Signed)
Ana Bogus, MD [place] [address]  CML (chronic myelocytic leukemia) [facility]) - Plan: CBC with Differential, Comprehensive metabolic panel, CBC with Differential, Comprehensive metabolic panel, BCR-ABL1, CML/ALL, PCR, QUANT, Vitamin B12, Folate, Vitamin B12  CURRENT THERAPY: Gleevac 400 mg daily  INTERVAL HISTORY: Ana Nelson 75 y.o. 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  I personally reviewed and went over laboratory results with the patient.  The results are noted within this dictation.  Labs from April 2017 are stable.  I personally reviewed and went over radiographic studies with the patient.  The results are noted within this dictation.  CT abd/pelvis from Feb 2017 for acute abdominal pain demonstrates mild anasarca and minimal fluid around liver.  Overall, the patient is doing very well. She denies any complaints with regards to her leukemia. She does note continued chronic migraines. This is being managed by her primary care physician at this time. She had seen headache specialist in the past but this became cost prohibitive. "I want to find someone who will give me Botox every 3 weeks."  Review of Systems  Constitutional: Negative.  Negative for fever, chills and weight loss.  HENT: Negative for sore throat.   Eyes: Negative.   Respiratory: Negative.   Cardiovascular: Negative.   Gastrointestinal: Negative.   Genitourinary: Negative.   Musculoskeletal: Negative.   Skin: Negative.   Neurological: Positive for headaches (Chronic migraines x 65 years).  Endo/Heme/Allergies: Negative.   Psychiatric/Behavioral: Negative.     Past Medical History  Diagnosis Date  . Osteoarthritis   . CML (chronic myelocytic leukemia) ([place])   . History of migraine headaches     "continuous migraines" per Heme/Onc note  04/2015  . Right knee pain   . Injury of left shoulder   . Blindness of right eye   . Drooping eyelid     left eye  . CML (chronic myelocytic leukemia) ([place]) 05/04/2012  . Shingles     Past Surgical History  Procedure Laterality Date  . Bowel blockage surgery    . Right kidney removed  1980  . Bone marrow bx & asp    . Rt. cataract surgery      Family History  Problem Relation Age of Onset  . Cancer Maternal Uncle     bone  . Cancer Paternal Aunt     uterine  . Cancer Maternal Grandmother     breast  . Cancer Maternal Grandfather     throat  . Cancer Paternal Grandmother     leukemia  . Cancer Son     colon    Social History   Social History  . Marital Status: Married    Spouse Name: N/A  . Number of Children: N/A  . Years of Education: N/A   Social History Main Topics  . Smoking status: Never Smoker   . Smokeless tobacco: Never Used  . Alcohol Use: No  . Drug Use: No  . Sexual Activity: No   Other Topics Concern  . None   Social History Narrative     PHYSICAL EXAMINATION  ECOG PERFORMANCE STATUS: 0 - Asymptomatic  Filed Vitals:   12/04/15 1030  BP: 130/92  Pulse: 56  Temp: 98.4 F (36.9 C)  Resp: 18    GENERAL:alert, no distress, well nourished, well developed, comfortable, cooperative and smiling, accompanied by her husband.  SKIN: skin color, texture, turgor are normal, no rashes or significant lesions HEAD: Normocephalic, No masses, lesions, tenderness or abnormalities EYES: normal, EOMI, Conjunctiva are pink and non-injected EARS: External ears normal OROPHARYNX:lips, buccal mucosa, and tongue normal and mucous membranes are moist  NECK: supple, trachea midline LYMPH:  no palpable lymphadenopathy BREAST:not examined LUNGS: clear to auscultation and percussion HEART: regular rate & rhythm, no murmurs, no gallops, S1 normal and S2 normal ABDOMEN:abdomen soft, non-tender and normal bowel sounds BACK: Back symmetric, no  curvature. EXTREMITIES:less then 2 second capillary refill, no joint deformities, effusion, or inflammation, no skin discoloration, no cyanosis  NEURO: alert & oriented x 3 with fluent speech, no focal motor/sensory deficits, gait normal   LABORATORY DATA: CBC    Component Value Date/Time   WBC 4.5 10/20/2015 1235   RBC 3.46* 10/20/2015 1235   HGB 11.9* 10/20/2015 1235   HCT 35.7* 10/20/2015 1235   PLT 103* 10/20/2015 1235   MCV 103.2* 10/20/2015 1235   MCH 34.4* 10/20/2015 1235   MCHC 33.3 10/20/2015 1235   RDW 14.9 10/20/2015 1235   LYMPHSABS 0.9 10/20/2015 1235   MONOABS 0.4 10/20/2015 1235   EOSABS 0.1 10/20/2015 1235   BASOSABS 0.0 10/20/2015 1235      Chemistry      Component Value Date/Time   NA 142 10/20/2015 1235   K 3.7 10/20/2015 1235   CL 106 10/20/2015 1235   CO2 30 10/20/2015 1235   BUN 18 10/20/2015 1235   CREATININE 1.51* 10/20/2015 1235      Component Value Date/Time   CALCIUM 9.0 10/20/2015 1235   ALKPHOS 57 10/20/2015 1235   AST 41 10/20/2015 1235   ALT 19 10/20/2015 1235   BILITOT 0.4 10/20/2015 1235        PENDING LABS:   RADIOGRAPHIC STUDIES:  No results found.   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    Labs in 3 months: CBC diff, CMET, B12.    Labs in 6 months: CBC diff, CMET, BCR/ABL, B12 and folate.   Compliance with Gleevac re-enforced.    She is currently on PO B12 replacement.  We briefly discussed her kidney dysfunction with long term gleevec use: Currently I would not make any changes to her medication. She will continue to follow with Dr. [name].  Imatinib and Risk of Kidney Function Decline Safety Review March 2016  A [facility] safety review concluded there was sufficient evidence to consider a potential link between [employer] [place] (imatinib) and a  decline in kidney function during long-term treatment. [facility] reviewed 30 reports of decreased or abnormal kidney function linked with imatinib and Novartis found an additional 63 reports worldwide of decreased kidney function linked with imatinib. To reduce this risk, [facility] is working with [employer] to include the following safety information in the product monograph: Long-term treatment with [place] may result in decline in renal function. Patients treated with imatinib in clinical studies had a decrease over time in estimated glomerular filtration rate (eGFR). Monitoring for renal function should be undertaken before initiating therapy and periodically thereafter. Further information can be found at: http://www.hc-[place].gc.ca/dhp-mps/medeff/reviews-examens/gleevec-eng.php  Return in 6 months for follow-up.    ORDERS PLACED FOR THIS ENCOUNTER: Orders Placed This Encounter  Procedures  . CBC with Differential  . Comprehensive metabolic panel  . CBC with Differential  . Comprehensive metabolic panel  .  BCR-ABL1, CML/ALL, PCR, QUANT  . Vitamin B12  . Folate  . Vitamin B12    MEDICATIONS PRESCRIBED THIS ENCOUNTER: No orders of the defined types were placed in this encounter.    THERAPY PLAN:  Continue Gleevac with continued monitoring of blood counts and BCR/ABL.  All questions were answered. The patient knows to call the clinic with any problems, questions or concerns. We can certainly see the patient much sooner if necessary.  Patient and plan discussed with Dr. Ancil Linsey and she is in agreement with the aforementioned.   This note is electronically signed by: Ana Nelson 12/04/2015 4:44 PM

## 2015-12-11 DIAGNOSIS — M25561 Pain in right knee: Secondary | ICD-10-CM | POA: Diagnosis not present

## 2015-12-11 DIAGNOSIS — M1711 Unilateral primary osteoarthritis, right knee: Secondary | ICD-10-CM | POA: Diagnosis not present

## 2016-01-14 DIAGNOSIS — Z79899 Other long term (current) drug therapy: Secondary | ICD-10-CM | POA: Diagnosis not present

## 2016-01-14 DIAGNOSIS — I10 Essential (primary) hypertension: Secondary | ICD-10-CM | POA: Diagnosis not present

## 2016-01-14 DIAGNOSIS — N183 Chronic kidney disease, stage 3 (moderate): Secondary | ICD-10-CM | POA: Diagnosis not present

## 2016-01-14 DIAGNOSIS — D509 Iron deficiency anemia, unspecified: Secondary | ICD-10-CM | POA: Diagnosis not present

## 2016-01-14 DIAGNOSIS — D34 Benign neoplasm of thyroid gland: Secondary | ICD-10-CM | POA: Diagnosis not present

## 2016-01-14 DIAGNOSIS — E0520 Thyrotoxicosis with toxic multinodular goiter without thyrotoxic crisis or storm: Secondary | ICD-10-CM | POA: Diagnosis not present

## 2016-01-14 DIAGNOSIS — E559 Vitamin D deficiency, unspecified: Secondary | ICD-10-CM | POA: Diagnosis not present

## 2016-01-19 DIAGNOSIS — N183 Chronic kidney disease, stage 3 (moderate): Secondary | ICD-10-CM | POA: Diagnosis not present

## 2016-01-19 DIAGNOSIS — N2581 Secondary hyperparathyroidism of renal origin: Secondary | ICD-10-CM | POA: Diagnosis not present

## 2016-01-19 DIAGNOSIS — I10 Essential (primary) hypertension: Secondary | ICD-10-CM | POA: Diagnosis not present

## 2016-01-19 DIAGNOSIS — D649 Anemia, unspecified: Secondary | ICD-10-CM | POA: Diagnosis not present

## 2016-01-27 DIAGNOSIS — D34 Benign neoplasm of thyroid gland: Secondary | ICD-10-CM | POA: Diagnosis not present

## 2016-01-27 DIAGNOSIS — E785 Hyperlipidemia, unspecified: Secondary | ICD-10-CM | POA: Diagnosis not present

## 2016-01-27 DIAGNOSIS — I480 Paroxysmal atrial fibrillation: Secondary | ICD-10-CM | POA: Diagnosis not present

## 2016-01-27 DIAGNOSIS — E039 Hypothyroidism, unspecified: Secondary | ICD-10-CM | POA: Diagnosis not present

## 2016-01-27 DIAGNOSIS — E0520 Thyrotoxicosis with toxic multinodular goiter without thyrotoxic crisis or storm: Secondary | ICD-10-CM | POA: Diagnosis not present

## 2016-02-02 DIAGNOSIS — H524 Presbyopia: Secondary | ICD-10-CM | POA: Diagnosis not present

## 2016-03-04 ENCOUNTER — Other Ambulatory Visit (HOSPITAL_COMMUNITY): Payer: Medicare Other

## 2016-04-14 ENCOUNTER — Ambulatory Visit (INDEPENDENT_AMBULATORY_CARE_PROVIDER_SITE_OTHER): Payer: Medicare Other | Admitting: Orthopedic Surgery

## 2016-04-14 DIAGNOSIS — M25512 Pain in left shoulder: Secondary | ICD-10-CM | POA: Diagnosis not present

## 2016-04-14 DIAGNOSIS — M1711 Unilateral primary osteoarthritis, right knee: Secondary | ICD-10-CM | POA: Diagnosis not present

## 2016-04-18 ENCOUNTER — Other Ambulatory Visit (HOSPITAL_COMMUNITY): Payer: Self-pay | Admitting: Oncology

## 2016-04-22 ENCOUNTER — Other Ambulatory Visit (HOSPITAL_COMMUNITY): Payer: Self-pay | Admitting: Pulmonary Disease

## 2016-04-22 DIAGNOSIS — Z1231 Encounter for screening mammogram for malignant neoplasm of breast: Secondary | ICD-10-CM

## 2016-05-04 ENCOUNTER — Ambulatory Visit (HOSPITAL_COMMUNITY)
Admission: RE | Admit: 2016-05-04 | Discharge: 2016-05-04 | Disposition: A | Discharge status: Home / Self Care | Payer: Medicare Other | Source: Ambulatory Visit | Attending: Pulmonary Disease | Admitting: Pulmonary Disease

## 2016-05-04 ENCOUNTER — Other Ambulatory Visit (HOSPITAL_COMMUNITY): Payer: Self-pay | Admitting: Pulmonary Disease

## 2016-05-04 DIAGNOSIS — R0602 Shortness of breath: Secondary | ICD-10-CM

## 2016-05-04 DIAGNOSIS — R079 Chest pain, unspecified: Secondary | ICD-10-CM | POA: Diagnosis not present

## 2016-05-10 ENCOUNTER — Telehealth (INDEPENDENT_AMBULATORY_CARE_PROVIDER_SITE_OTHER): Payer: Self-pay | Admitting: *Deleted

## 2016-05-10 NOTE — Telephone Encounter (Signed)
Pt. Called stating her knee was swelling and knows she cannot have another cortizone shot but wanted to see if any fluid could be drawn off. I tried to schedule her for 11/9 but pt. Stated she didn't want to come to this appt. If this could not be done. Pt. Requesting call back at [phone]

## 2016-05-10 NOTE — Telephone Encounter (Signed)
Please advise 

## 2016-05-11 NOTE — Telephone Encounter (Signed)
Ok for aspiration pls clal htx

## 2016-05-13 NOTE — Telephone Encounter (Signed)
IC pt, scheduled an appt for knee aspiration Monday 05/16/16 1045 am

## 2016-05-16 ENCOUNTER — Ambulatory Visit (INDEPENDENT_AMBULATORY_CARE_PROVIDER_SITE_OTHER): Payer: Medicare Other | Admitting: Orthopedic Surgery

## 2016-05-16 ENCOUNTER — Encounter (INDEPENDENT_AMBULATORY_CARE_PROVIDER_SITE_OTHER): Payer: Self-pay | Admitting: Orthopedic Surgery

## 2016-05-16 DIAGNOSIS — M25461 Effusion, right knee: Secondary | ICD-10-CM | POA: Diagnosis not present

## 2016-05-16 DIAGNOSIS — M25561 Pain in right knee: Secondary | ICD-10-CM | POA: Diagnosis not present

## 2016-05-16 DIAGNOSIS — G8929 Other chronic pain: Secondary | ICD-10-CM | POA: Diagnosis not present

## 2016-05-16 MED ORDER — LIDOCAINE HCL 1 % IJ SOLN
5.0000 mL | INTRAMUSCULAR | Status: AC | PRN
  Administered 2016-05-16: 5 mL

## 2016-05-16 NOTE — Progress Notes (Signed)
Office Visit Note   Patient: Ana Nelson           Date of Birth: 05-14-1941           MRN: [id number] Visit Date: 05/16/2016 Requested by: Sinda Du, MD [address] [place], [place] [id number] PCP: [name], MD  Subjective: Chief Complaint  Patient presents with  . Right Knee - Pain    HPI Ana Nelson is a 75 year old patient with right knee arthritis.  Have aspiration injection in mid October.  She's been doing reasonably well but has had recurrence of the pain.  Here to have the knee aspirated.  Ports primary pain symptoms not much in way of mechanical symptoms              Review of Systems all systems reviewed negative as they relate to the chief complaint.  No fevers or chills   Assessment & Plan: Visit Diagnoses:  1. Chronic pain of right knee   2. Swelling of right knee joint     Plan: Impression is endstage right knee arthritis with effusion plan knee is aspirated today.  She may want to get something done about the knee arthritis in the future.  We can start to cycle of injections again next year Follow-Up Instructions: No Follow-up on file.   Orders:  No orders of the defined types were placed in this encounter.  No orders of the defined types were placed in this encounter.     Procedures: Large Joint Inj Date/Time: 05/16/2016 11:25 AM Performed by: Meredith Pel Authorized by: Meredith Pel   Consent Given by:  Patient Site marked: the procedure site was marked   Timeout: prior to procedure the correct patient, procedure, and site was verified   Indications:  Pain, joint swelling and diagnostic evaluation Location:  Knee Site:  R knee Prep: patient was prepped and draped in usual sterile fashion   Needle Size:  18 G Needle Length:  1.5 inches Approach:  Superolateral Ultrasound Guidance: No   Fluoroscopic Guidance: No   Arthrogram: No Medications:  5 mL lidocaine 1 % Aspiration Attempted: Yes   Aspirate:   Serous Patient tolerance:  Patient tolerated the procedure well with no immediate complications     Clinical Data: No additional findings.  Objective: Vital Signs: There were no vitals taken for this visit.  Physical Exam  Constitutional: She appears well-developed.  HENT:  Head: Normocephalic.  Eyes: EOM are normal.  Neck: Normal range of motion.  Cardiovascular: Normal rate.   Pulmonary/Chest: Effort normal.  Neurological: She is alert.  Skin: Skin is warm.  Psychiatric: She has a normal mood and affect.    Ortho Exam right knee demonstrates effusion but reasonable range of motion medial lateral joint line tenderness palpable pedal pulses intact since mechanism no other masses lymph after skin changes noted in the right knee region  Specialty Comments:  No specialty comments available.  Imaging: No results found.   PMFS History: Patient Active Problem List   Diagnosis Date Noted  . CML (chronic myelocytic leukemia) ([place]) 05/04/2012   Past Medical History:  Diagnosis Date  . Blindness of right eye   . CML (chronic myelocytic leukemia) ([place])   . CML (chronic myelocytic leukemia) ([place]) 05/04/2012  . Drooping eyelid    left eye  . History of migraine headaches    "continuous migraines" per Heme/Onc note 04/2015  . Injury of left shoulder   . Osteoarthritis   . Right  knee pain   . Shingles     Family History  Problem Relation Age of Onset  . Cancer Maternal Uncle     bone  . Cancer Paternal Aunt     uterine  . Cancer Maternal Grandmother     breast  . Cancer Maternal Grandfather     throat  . Cancer Paternal Grandmother     leukemia  . Cancer Son     colon    Past Surgical History:  Procedure Laterality Date  . Bone Marrow Bx & Asp    . bowel blockage surgery    . right kidney removed  1980  . Rt. Cataract surgery     Social History   Occupational History  . Not on file.   Social History Main Topics  . Smoking status: Never Smoker  .  Smokeless tobacco: Never Used  . Alcohol use No  . Drug use: No  . Sexual activity: No

## 2016-05-30 ENCOUNTER — Ambulatory Visit (HOSPITAL_COMMUNITY): Payer: Medicare Other

## 2016-05-31 ENCOUNTER — Encounter (HOSPITAL_COMMUNITY): Payer: Medicare Other | Attending: Hematology & Oncology

## 2016-05-31 DIAGNOSIS — C9210 Chronic myeloid leukemia, BCR/ABL-positive, not having achieved remission: Secondary | ICD-10-CM | POA: Diagnosis present

## 2016-05-31 LAB — CBC WITH DIFFERENTIAL/PLATELET
BASOS PCT: 0 %
Basophils Absolute: 0 10*3/uL (ref 0.0–0.1)
EOS ABS: 0.1 10*3/uL (ref 0.0–0.7)
Eosinophils Relative: 2 %
HEMATOCRIT: 35.8 % — AB (ref 36.0–46.0)
Hemoglobin: 11.2 g/dL — ABNORMAL LOW (ref 12.0–15.0)
Lymphocytes Relative: 23 %
Lymphs Abs: 1.1 10*3/uL (ref 0.7–4.0)
MCH: 33.2 pg (ref 26.0–34.0)
MCHC: 31.3 g/dL (ref 30.0–36.0)
MCV: 106.2 fL — ABNORMAL HIGH (ref 78.0–100.0)
MONO ABS: 0.4 10*3/uL (ref 0.1–1.0)
MONOS PCT: 9 %
Neutro Abs: 3.3 10*3/uL (ref 1.7–7.7)
Neutrophils Relative %: 66 %
Platelets: 122 10*3/uL — ABNORMAL LOW (ref 150–400)
RBC: 3.37 MIL/uL — ABNORMAL LOW (ref 3.87–5.11)
RDW: 16.4 % — AB (ref 11.5–15.5)
WBC: 4.9 10*3/uL (ref 4.0–10.5)

## 2016-05-31 LAB — COMPREHENSIVE METABOLIC PANEL
ALBUMIN: 3.9 g/dL (ref 3.5–5.0)
ALT: 25 U/L (ref 14–54)
ANION GAP: 4 — AB (ref 5–15)
AST: 52 U/L — ABNORMAL HIGH (ref 15–41)
Alkaline Phosphatase: 60 U/L (ref 38–126)
BUN: 15 mg/dL (ref 6–20)
CO2: 29 mmol/L (ref 22–32)
Calcium: 8.9 mg/dL (ref 8.9–10.3)
Chloride: 110 mmol/L (ref 101–111)
Creatinine, Ser: 1.45 mg/dL — ABNORMAL HIGH (ref 0.44–1.00)
GFR calc non Af Amer: 34 mL/min — ABNORMAL LOW (ref >60)
GFR, EST AFRICAN AMERICAN: 40 mL/min — AB (ref >60)
GLUCOSE: 99 mg/dL (ref 65–99)
POTASSIUM: 4.3 mmol/L (ref 3.5–5.1)
SODIUM: 143 mmol/L (ref 135–145)
TOTAL PROTEIN: 6 g/dL — AB (ref 6.5–8.1)
Total Bilirubin: 0.3 mg/dL (ref 0.3–1.2)

## 2016-05-31 LAB — VITAMIN B12: VITAMIN B 12: 300 pg/mL (ref 180–914)

## 2016-05-31 LAB — FOLATE: FOLATE: 13.3 ng/mL (ref >5.9)

## 2016-06-06 ENCOUNTER — Ambulatory Visit (HOSPITAL_COMMUNITY): Payer: Medicare Other | Admitting: Oncology

## 2016-06-06 LAB — BCR-ABL1, CML/ALL, PCR, QUANT

## 2016-06-07 ENCOUNTER — Ambulatory Visit (HOSPITAL_COMMUNITY): Payer: Medicare Other | Admitting: Oncology

## 2016-06-07 ENCOUNTER — Other Ambulatory Visit (HOSPITAL_COMMUNITY): Payer: Medicare Other

## 2016-06-07 ENCOUNTER — Ambulatory Visit (INDEPENDENT_AMBULATORY_CARE_PROVIDER_SITE_OTHER): Payer: Medicare Other | Admitting: Orthopedic Surgery

## 2016-06-09 ENCOUNTER — Ambulatory Visit (HOSPITAL_COMMUNITY): Payer: Medicare Other

## 2016-06-13 ENCOUNTER — Telehealth (INDEPENDENT_AMBULATORY_CARE_PROVIDER_SITE_OTHER): Payer: Self-pay | Admitting: Orthopedic Surgery

## 2016-06-13 NOTE — Telephone Encounter (Signed)
Patient had fluid drawn off her right knee by Dr. Marlou Sa on 05/16/16. She is coming in to see Sharol Given on Wednesday and wants to know if she can have this done again or if it is to soon. Please give pt a call.

## 2016-06-14 NOTE — Telephone Encounter (Signed)
See note from patient, IC her and clarified that she DOES want to see Dr Sharol Given.  She wants to know if he can definitely aspirate the knee again tomorrow?  It is very swollen again.  Please call her to advise, thanks.

## 2016-06-15 ENCOUNTER — Encounter (INDEPENDENT_AMBULATORY_CARE_PROVIDER_SITE_OTHER): Payer: Self-pay | Admitting: Orthopedic Surgery

## 2016-06-15 ENCOUNTER — Ambulatory Visit (INDEPENDENT_AMBULATORY_CARE_PROVIDER_SITE_OTHER): Payer: Medicare Other | Admitting: Orthopedic Surgery

## 2016-06-15 VITALS — Ht 61.0 in | Wt 116.0 lb

## 2016-06-15 DIAGNOSIS — M1711 Unilateral primary osteoarthritis, right knee: Secondary | ICD-10-CM | POA: Diagnosis not present

## 2016-06-15 NOTE — Telephone Encounter (Signed)
appt today

## 2016-06-15 NOTE — Progress Notes (Signed)
Office Visit Note   Patient: Ana Nelson           Date of Birth: 21-Oct-1940           MRN: [id number] Visit Date: 06/15/2016              Requested by: Sinda Du, MD [address] [place], [place] [address] PCP: [name], MD   Assessment & Plan: Visit Diagnoses:  1. Unilateral primary osteoarthritis, right knee     Plan: Have provided a penssaid sample. She will trial this. Is unable to take oral NSAIDs. She will call us when she is ready to set up total knee arthroplasty. Strength with this off until March after she attends a concert.  Follow-Up Instructions: No Follow-up on file.   Orders:  No orders of the defined types were placed in this encounter.  No orders of the defined types were placed in this encounter.     Procedures: No procedures performed   Clinical Data: No additional findings.   Subjective: Chief Complaint  Patient presents with  . Right Knee - Pain    Patient is a 75 year old woman who is here for right knee pain and swelling. History of arthritis right knee. She recently had aspiration and injection 04/14/16 with Dr. Marlou Sa, where 44ml was drawn off. She is has been having past couple weeks. She would like repeat aspiration today, she states she has had her limit of cortisone this year.     Review of Systems  Constitutional: Negative for chills and fever.     Objective: Vital Signs: Ht 5\' 1"  (1.549 m)   Wt 116 lb (52.6 kg)   BMI 21.92 kg/m   Physical Exam  Constitutional: She is oriented to person, place, and time. She appears well-developed and well-nourished.  Pulmonary/Chest: Effort normal.  Musculoskeletal:       Right knee: She exhibits no effusion.  Neurological: She is alert and oriented to person, place, and time.  Psychiatric: She has a normal mood and affect.  Nursing note reviewed.   Right Knee Exam   Tenderness  The patient is experiencing tenderness in the lateral joint line and  medial joint line.  Range of Motion  The patient has normal right knee ROM.  Tests  Varus: negative Valgus: negative  Other  Erythema: absent Swelling: none Other tests: no effusion present  Comments:  No bakers cyst      Specialty Comments:  No specialty comments available.  Imaging: No results found.   PMFS History: Patient Active Problem List   Diagnosis Date Noted  . Unilateral primary osteoarthritis, right knee 06/15/2016  . CML (chronic myelocytic leukemia) ([place]) 05/04/2012   Past Medical History:  Diagnosis Date  . Blindness of right eye   . CML (chronic myelocytic leukemia) ([place])   . CML (chronic myelocytic leukemia) ([place]) 05/04/2012  . Drooping eyelid    left eye  . History of migraine headaches    "continuous migraines" per Heme/Onc note 04/2015  . Injury of left shoulder   . Osteoarthritis   . Right knee pain   . Shingles     Family History  Problem Relation Age of Onset  . Cancer Maternal Uncle     bone  . Cancer Paternal Aunt     uterine  . Cancer Maternal Grandmother     breast  . Cancer Maternal Grandfather     throat  . Cancer Paternal Grandmother     leukemia  .  Cancer Son     colon    Past Surgical History:  Procedure Laterality Date  . Bone Marrow Bx & Asp    . bowel blockage surgery    . right kidney removed  1980  . Rt. Cataract surgery     Social History   Occupational History  . Not on file.   Social History Main Topics  . Smoking status: Never Smoker  . Smokeless tobacco: Never Used  . Alcohol use No  . Drug use: No  . Sexual activity: No

## 2016-06-16 ENCOUNTER — Encounter: Payer: Self-pay | Admitting: Obstetrics & Gynecology

## 2016-06-16 ENCOUNTER — Ambulatory Visit (HOSPITAL_COMMUNITY): Payer: Medicare Other

## 2016-06-16 ENCOUNTER — Other Ambulatory Visit (INDEPENDENT_AMBULATORY_CARE_PROVIDER_SITE_OTHER): Payer: Medicare Other

## 2016-06-16 ENCOUNTER — Telehealth: Payer: Self-pay | Admitting: *Deleted

## 2016-06-16 ENCOUNTER — Ambulatory Visit (INDEPENDENT_AMBULATORY_CARE_PROVIDER_SITE_OTHER): Payer: Medicare Other | Admitting: Obstetrics & Gynecology

## 2016-06-16 VITALS — BP 140/70 | HR 76 | Wt 119.0 lb

## 2016-06-16 DIAGNOSIS — R102 Pelvic and perineal pain: Secondary | ICD-10-CM

## 2016-06-16 DIAGNOSIS — D259 Leiomyoma of uterus, unspecified: Secondary | ICD-10-CM

## 2016-06-16 DIAGNOSIS — K5732 Diverticulitis of large intestine without perforation or abscess without bleeding: Secondary | ICD-10-CM | POA: Diagnosis not present

## 2016-06-16 DIAGNOSIS — N854 Malposition of uterus: Secondary | ICD-10-CM

## 2016-06-16 MED ORDER — CIPROFLOXACIN HCL 500 MG PO TABS: 500.0000 mg | ORAL_TABLET | Freq: Two times a day (BID) | ORAL | 0 refills | Status: DC

## 2016-06-16 MED ORDER — METRONIDAZOLE 500 MG PO TABS: 500.0000 mg | ORAL_TABLET | Freq: Two times a day (BID) | ORAL | 0 refills | Status: DC

## 2016-06-16 NOTE — Telephone Encounter (Signed)
Spoke with pt. Pt is having pelvic pain. It's been worse the last few days, hard to walk. Pt had spotting 2 weeks ago that lasted for 2-3 days. I spoke with JAG and she advised pt needs to be seen before 12/19. Call transferred up front for sooner appt. [place]

## 2016-06-16 NOTE — Progress Notes (Signed)
Past Medical History:  Diagnosis Date  . Blindness of right eye   . CML (chronic myelocytic leukemia) ([place])   . CML (chronic myelocytic leukemia) ([place]) 05/04/2012  . Drooping eyelid    left eye  . History of migraine headaches    "continuous migraines" per Heme/Onc note 04/2015  . Injury of left shoulder   . Osteoarthritis   . Right knee pain   . Shingles     Past Surgical History:  Procedure Laterality Date  . Bone Marrow Bx & Asp    . bowel blockage surgery    . right kidney removed  1980  . Rt. Cataract surgery      OB History    No data available      Allergies  Allergen Reactions  . Nubain [Nalbuphine Hcl] Swelling  . Perphenazine     seizures  . Ibuprofen     Interaction with medications   . Tylenol [Acetaminophen] Other (See Comments)    Interaction with medciations  . Vistaril [Hydroxyzine Hcl] Swelling and Other (See Comments)    Made my tongue swell    Social History   Social History  . Marital status: Married    Spouse name: N/A  . Number of children: N/A  . Years of education: N/A   Social History Main Topics  . Smoking status: Never Smoker  . Smokeless tobacco: Never Used  . Alcohol use No  . Drug use: No  . Sexual activity: No   Other Topics Concern  . None   Social History Narrative  . None    Family History  Problem Relation Age of Onset  . Cancer Maternal Uncle     bone  . Cancer Paternal Aunt     uterine  . Cancer Maternal Grandmother     breast  . Cancer Maternal Grandfather     throat  . Cancer Paternal Grandmother     leukemia  . Cancer Son     colon        Chief Complaint  Patient presents with  . pain left side puberty area    Blood pressure 140/70, pulse 76, weight 119 lb (54 kg).  75 y.o. No obstetric history on file. No LMP recorded. Patient is postmenopausal. The current method of family planning is post menopausal status.  Outpatient Encounter Prescriptions as of 06/16/2016  Medication Sig Note  .  ALPRAZolam (XANAX) 1 MG tablet Take 1 mg by mouth 3 (three) times daily as needed for sleep.   Marland Kitchen GARLIC PO Take 1 tablet by mouth daily.   Marland Kitchen imatinib (GLEEVEC) 400 MG tablet TAKE 1 TABLET BY MOUTH DAILY WITH FOOD AND A LARGE GLASS OF WATER *ALWAYS USE SECONDARY INS XL HEALTH ON THIS MEDICATION*   . levothyroxine (SYNTHROID, LEVOTHROID) 88 MCG tablet Take 88 mcg by mouth daily before breakfast.   . Omega-3 Fatty Acids (FISH OIL PO) Take 1 capsule by mouth daily.   . potassium chloride (K-DUR) 10 MEQ tablet Take 1 tablet (10 mEq total) by mouth 2 (two) times daily. (Patient taking differently: Take 10 mEq by mouth daily. )   . sertraline (ZOLOFT) 50 MG tablet Take 50 mg by mouth daily.   . butorphanol (STADOL) 10 MG/ML nasal spray Place 1 spray into the nose every 4 (four) hours as needed (migraines).   . ciprofloxacin (CIPRO) 500 MG tablet Take 1 tablet (500 mg total) by mouth 2 (two) times daily.   . Cyanocobalamin (B-12 PO) Take 1 tablet by  mouth daily.   Marland Kitchen gabapentin (NEURONTIN) 300 MG capsule Take 300 mg by mouth 2 (two) times daily.   . metroNIDAZOLE (FLAGYL) 500 MG tablet Take 1 tablet (500 mg total) by mouth 2 (two) times daily.   . [DISCONTINUED] levothyroxine (SYNTHROID, LEVOTHROID) 50 MCG tablet Take 50 mcg by mouth daily before breakfast.  10/30/2015: Received from: External Pharmacy  . [DISCONTINUED] oxyCODONE (OXY IR/ROXICODONE) 5 MG immediate release tablet Take 5 mg by mouth 3 (three) times daily as needed for moderate pain or severe pain.  10/30/2015: Received from: External Pharmacy  . [DISCONTINUED] potassium chloride (K-DUR,KLOR-CON) 10 MEQ tablet  06/15/2016: Received from: External Pharmacy  . [DISCONTINUED] promethazine (PHENERGAN) 25 MG tablet Take 25 mg by mouth every 6 (six) hours as needed for nausea or vomiting. Takes 1/2 tablet   . [DISCONTINUED] topiramate (TOPAMAX) 200 MG tablet Take 200 mg by mouth 2 (two) times daily.    No facility-administered encounter medications on  file as of 06/16/2016.     Subjective Pt with left lower quadrant pain over the past week or so was concerned it was ovarian Exam reveals the tenderness to be much higher than her ovary +/- fever No bleeding No urinary symptoms  Objective General WDWN female NAD Vulva:  normal appearing vulva with no masses, tenderness or lesions Vagina:  normal mucosa, no discharge Cervix:  no cervical motion tenderness and no lesions Uterus:  normal size, contour, position, consistency, mobility, non-tender Adnexa: ovaries:present,  normal adnexa in size, nontender and no masses, no masses   Pertinent ROS No burning with urination, frequency or urgency No nausea, vomiting or diarrhea Nor fever chills or other constitutional symptoms   Labs or studies     Impression Diagnoses this Encounter::   ICD-9-CM ICD-10-CM   1. Diverticulitis large intestine w/o perforation or abscess w/o bleeding 562.11 K57.32   2. Pelvic pain in female 625.9 R10.2 US Pelvis Complete     US Transvaginal Non-OB    Established relevant diagnosis(es):   Plan/Recommendations: Meds ordered this encounter  Medications  . ciprofloxacin (CIPRO) 500 MG tablet    Sig: Take 1 tablet (500 mg total) by mouth 2 (two) times daily.    Dispense:  20 tablet    Refill:  0  . metroNIDAZOLE (FLAGYL) 500 MG tablet    Sig: Take 1 tablet (500 mg total) by mouth 2 (two) times daily.    Dispense:  20 tablet    Refill:  0    Labs or Scans Ordered: Orders Placed This Encounter  Procedures  . US Pelvis Complete  . US Transvaginal Non-OB    Management:: Will treat with cipro flagyl ordr sonogram and follow up 2 weeks  Follow up Return in about 2 weeks (around 06/30/2016) for Follow up, with Dr Elonda Husky.        .   All questions were answered.  Past Medical History:  Diagnosis Date  . Blindness of right eye   . CML (chronic myelocytic leukemia) ([place])   . CML (chronic myelocytic leukemia) ([place]) 05/04/2012  .  Drooping eyelid    left eye  . History of migraine headaches    "continuous migraines" per Heme/Onc note 04/2015  . Injury of left shoulder   . Osteoarthritis   . Right knee pain   . Shingles     Past Surgical History:  Procedure Laterality Date  . Bone Marrow Bx & Asp    . bowel blockage surgery    . right kidney removed  [place]. Cataract surgery      OB History    No data available      Allergies  Allergen Reactions  . Nubain [Nalbuphine Hcl] Swelling  . Perphenazine     seizures  . Ibuprofen     Interaction with medications   . Tylenol [Acetaminophen] Other (See Comments)    Interaction with medciations  . Vistaril [Hydroxyzine Hcl] Swelling and Other (See Comments)    Made my tongue swell    Social History   Social History  . Marital status: Married    Spouse name: N/A  . Number of children: N/A  . Years of education: N/A   Social History Main Topics  . Smoking status: Never Smoker  . Smokeless tobacco: Never Used  . Alcohol use No  . Drug use: No  . Sexual activity: No   Other Topics Concern  . None   Social History Narrative  . None    Family History  Problem Relation Age of Onset  . Cancer Maternal Uncle     bone  . Cancer Paternal Aunt     uterine  . Cancer Maternal Grandmother     breast  . Cancer Maternal Grandfather     throat  . Cancer Paternal Grandmother     leukemia  . Cancer Son     colon

## 2016-06-16 NOTE — Progress Notes (Signed)
PELVIC US TA/TV: heterogeneous retroverted uterus,calcified anterior fundal fibroid 2.6 x 1.7 x 1.6 cm,small amount of simple cul de sac fluid,normal ov's bilat,no adnexal masses seen,limited view of adnexa's because of bowel gas,EEC 4.4 mm w/a trace of fluid w/in the endometrium

## 2016-06-27 ENCOUNTER — Other Ambulatory Visit (HOSPITAL_COMMUNITY): Payer: Self-pay | Admitting: Pulmonary Disease

## 2016-06-27 ENCOUNTER — Ambulatory Visit (HOSPITAL_COMMUNITY)
Admission: RE | Admit: 2016-06-27 | Discharge: 2016-06-27 | Disposition: A | Discharge status: Home / Self Care | Payer: Medicare Other | Source: Ambulatory Visit | Attending: Pulmonary Disease | Admitting: Pulmonary Disease

## 2016-06-27 DIAGNOSIS — R938 Abnormal findings on diagnostic imaging of other specified body structures: Secondary | ICD-10-CM | POA: Diagnosis not present

## 2016-06-27 DIAGNOSIS — R110 Nausea: Secondary | ICD-10-CM | POA: Diagnosis not present

## 2016-06-27 DIAGNOSIS — I700 Atherosclerosis of aorta: Secondary | ICD-10-CM | POA: Diagnosis not present

## 2016-06-27 DIAGNOSIS — R109 Unspecified abdominal pain: Secondary | ICD-10-CM

## 2016-06-27 DIAGNOSIS — R100 Acute abdomen: Secondary | ICD-10-CM | POA: Insufficient documentation

## 2016-06-27 DIAGNOSIS — K8689 Other specified diseases of pancreas: Secondary | ICD-10-CM

## 2016-06-27 LAB — POCT I-STAT CREATININE: Creatinine, Ser: 1.5 mg/dL — ABNORMAL HIGH (ref 0.44–1.00)

## 2016-06-27 MED ORDER — IOPAMIDOL (ISOVUE-300) INJECTION 61%
80.0000 mL | Freq: Once | INTRAVENOUS | Status: AC | PRN
  Administered 2016-06-27: 80 mL via INTRAVENOUS

## 2016-06-28 ENCOUNTER — Encounter: Payer: Medicare Other | Admitting: Obstetrics & Gynecology

## 2016-06-28 ENCOUNTER — Ambulatory Visit (HOSPITAL_COMMUNITY)
Admission: RE | Admit: 2016-06-28 | Discharge: 2016-06-28 | Disposition: A | Discharge status: Home / Self Care | Payer: Medicare Other | Source: Ambulatory Visit | Attending: Pulmonary Disease | Admitting: Pulmonary Disease

## 2016-06-28 ENCOUNTER — Telehealth: Payer: Self-pay | Admitting: Obstetrics & Gynecology

## 2016-06-28 ENCOUNTER — Other Ambulatory Visit (HOSPITAL_COMMUNITY): Payer: Self-pay | Admitting: Pulmonary Disease

## 2016-06-28 DIAGNOSIS — K869 Disease of pancreas, unspecified: Secondary | ICD-10-CM | POA: Diagnosis not present

## 2016-06-28 DIAGNOSIS — K8689 Other specified diseases of pancreas: Secondary | ICD-10-CM

## 2016-06-28 MED ORDER — GADOBENATE DIMEGLUMINE 529 MG/ML IV SOLN
5.0000 mL | Freq: Once | INTRAVENOUS | Status: AC | PRN
  Administered 2016-06-28: 5 mL via INTRAVENOUS

## 2016-06-28 NOTE — Telephone Encounter (Signed)
Pt called stating that she has gone to the hospital yesterday and they found 2 spots on her pancreas and she is going back in for a CT scan to do more testing. Pt called stating that she would like a call back from Dr. Elonda Husky. Please contact pt

## 2016-06-28 NOTE — Telephone Encounter (Signed)
FYI: I spoke with Ana Nelson. She had a CT scan this am that showed a spot on her liver. She has further testing today. She is going to cancel appointment for Thursday but wanted me to let you know.

## 2016-06-29 NOTE — Telephone Encounter (Signed)
I returned her phone call and answered the questions I could answer at this point She knows the MRI report is pending

## 2016-06-30 ENCOUNTER — Ambulatory Visit: Payer: Medicare Other | Admitting: Obstetrics & Gynecology

## 2016-07-01 ENCOUNTER — Ambulatory Visit (HOSPITAL_COMMUNITY): Payer: Medicare Other | Admitting: Oncology

## 2016-07-19 DIAGNOSIS — E0520 Thyrotoxicosis with toxic multinodular goiter without thyrotoxic crisis or storm: Secondary | ICD-10-CM | POA: Diagnosis not present

## 2016-07-22 ENCOUNTER — Ambulatory Visit (INDEPENDENT_AMBULATORY_CARE_PROVIDER_SITE_OTHER): Payer: Medicare Other

## 2016-07-22 ENCOUNTER — Ambulatory Visit (INDEPENDENT_AMBULATORY_CARE_PROVIDER_SITE_OTHER): Payer: Medicare Other | Admitting: Orthopedic Surgery

## 2016-07-22 VITALS — Ht 61.0 in | Wt 119.0 lb

## 2016-07-22 DIAGNOSIS — M1711 Unilateral primary osteoarthritis, right knee: Secondary | ICD-10-CM

## 2016-07-22 DIAGNOSIS — M25511 Pain in right shoulder: Secondary | ICD-10-CM

## 2016-07-22 DIAGNOSIS — G8929 Other chronic pain: Secondary | ICD-10-CM | POA: Diagnosis not present

## 2016-07-22 MED ORDER — LIDOCAINE HCL 1 % IJ SOLN
5.0000 mL | INTRAMUSCULAR | Status: AC | PRN
  Administered 2016-07-22: 5 mL

## 2016-07-22 MED ORDER — METHYLPREDNISOLONE ACETATE 40 MG/ML IJ SUSP
40.0000 mg | INTRAMUSCULAR | Status: AC | PRN
  Administered 2016-07-22: 40 mg via INTRA_ARTICULAR

## 2016-07-22 NOTE — Progress Notes (Deleted)
   Procedure Note  Patient: Ana Nelson             Date of Birth: [birth year]           MRN: [id number]             Visit Date: 07/22/2016  Procedures: Visit Diagnoses: Chronic right shoulder pain - Plan: XR Shoulder Right, Large Joint Injection/Arthrocentesis, Large Joint Injection/Arthrocentesis  Unilateral primary osteoarthritis, right knee - Plan: Large Joint Injection/Arthrocentesis, Large Joint Injection/Arthrocentesis  Large Joint Inj Date/Time: 07/22/2016 12:21 PM Performed by: Newt Minion Authorized by: Newt Minion   Consent Given by:  Patient Site marked: the procedure site was marked   Timeout: prior to procedure the correct patient, procedure, and site was verified   Indications:  Pain and diagnostic evaluation Location:  Knee Site:  R knee Prep: patient was prepped and draped in usual sterile fashion   Needle Size:  22 G Needle Length:  1.5 inches Approach:  Anteromedial Ultrasound Guidance: No   Fluoroscopic Guidance: No   Arthrogram: No   Medications:  5 mL lidocaine 1 %; 40 mg methylPREDNISolone acetate 40 MG/ML Aspiration Attempted: No   Patient tolerance:  Patient tolerated the procedure well with no immediate complications Large Joint Inj Date/Time: 07/22/2016 12:21 PM Performed by: Tavia Stave V Authorized by: Newt Minion   Consent Given by:  Patient Site marked: the procedure site was marked   Timeout: prior to procedure the correct patient, procedure, and site was verified   Indications:  Pain and diagnostic evaluation Location:  Shoulder Site:  R subacromial bursa Prep: patient was prepped and draped in usual sterile fashion   Needle Size:  22 G Needle Length:  1.5 inches Ultrasound Guidance: No   Fluoroscopic Guidance: No   Arthrogram: No   Medications:  5 mL lidocaine 1 %; 40 mg methylPREDNISolone acetate 40 MG/ML Aspiration Attempted: No   Patient tolerance:  Patient tolerated the procedure well with no immediate  complications

## 2016-07-22 NOTE — Progress Notes (Signed)
Office Visit Note   Patient: Ana Nelson           Date of Birth: [birth year]           MRN: [id number] Visit Date: 07/22/2016              Requested by: Sinda Du, MD [facility] [place], [place] [address] PCP: [name], MD  Chief Complaint  Patient presents with  . Right Shoulder - Pain  . Right Knee - Pain    HPI: Right shoulder pain. Pt states that she has lost almost all strength in this arm and that she can not "(ick up anything". Also the right knee is painful. Anterior pain and swelling , decreased ROM. She states that she has had injections in her knee and shoulder before and that they have been helpful and she would like to to have both of them injected today if at all possible. Patient states that she takes Vicodin prn extreme pain. Pamella Pert, RMA    Assessment & Plan: Visit Diagnoses:  1. Chronic right shoulder pain   2. Unilateral primary osteoarthritis, right knee     Plan: Right shoulder injected right knee injected. Patient cannot afford the patient said topical anti-inflammatory she cannot take oral anti-inflammatories due to her kidney disease we will follow up in 4 weeks for evaluation for additional injection.  Follow-Up Instructions: Return in about 4 weeks (around 08/19/2016).   Ortho Exam Examination patient is alert oriented no adenopathy well-dressed normal affect normal respiratory effort she has an antalgic gait. She is globally tender to palpation around the right knee. Collaterals and cruciates are stable. There is no effusion no redness no cellulitis no signs of infection or gout. Examination the right shoulder she has abduction and flexion to 90. She has pain with Neer and Hawkins impingement test palpation over the biceps tendon.  Imaging: Xr Shoulder Right  Result Date: 07/22/2016 Two-view radiographs the right shoulder shows no lung field abnormalities the glenohumeral joint is congruent she has mild  arthritic changes of the before meals joint. She is superior migration of the humeral head within the glenoid with a decreased subacromial joint space.   Orders:  Orders Placed This Encounter  Procedures  . Large Joint Injection/Arthrocentesis  . Large Joint Injection/Arthrocentesis  . XR Shoulder Right   No orders of the defined types were placed in this encounter.    Procedures: Large Joint Inj Date/Time: 07/22/2016 12:16 PM Performed by: DUDA, MARCUS V Authorized by: Newt Minion   Consent Given by:  Patient Site marked: the procedure site was marked   Timeout: prior to procedure the correct patient, procedure, and site was verified   Indications:  Pain and diagnostic evaluation Location:  Shoulder Site:  R subacromial bursa Prep: patient was prepped and draped in usual sterile fashion   Needle Size:  22 G Needle Length:  1.5 inches Approach:  Anterior Ultrasound Guidance: No   Fluoroscopic Guidance: No   Arthrogram: No   Medications:  5 mL lidocaine 1 %; 40 mg methylPREDNISolone acetate 40 MG/ML Aspiration Attempted: No   Patient tolerance:  Patient tolerated the procedure well with no immediate complications Large Joint Inj Date/Time: 07/22/2016 12:17 PM Performed by: DUDA, MARCUS V Authorized by: Meridee Score V   Consent Given by:  Patient Site marked: the procedure site was marked   Timeout: prior to procedure the correct patient, procedure, and site was verified   Indications:  Pain  and diagnostic evaluation Location:  Knee Site:  R knee Needle Size:  22 G Needle Length:  1.5 inches Ultrasound Guidance: No   Fluoroscopic Guidance: No   Arthrogram: No   Medications:  5 mL lidocaine 1 %; 40 mg methylPREDNISolone acetate 40 MG/ML Aspiration Attempted: No   Patient tolerance:  Patient tolerated the procedure well with no immediate complications    Clinical Data: No additional findings.  Subjective: Review of Systems  Objective: Vital Signs: Ht 5\' 1"   (1.549 m)   Wt 119 lb (54 kg)   BMI 22.48 kg/m   Specialty Comments:  No specialty comments available.  PMFS History: Patient Active Problem List   Diagnosis Date Noted  . Chronic right shoulder pain 07/22/2016  . Unilateral primary osteoarthritis, right knee 06/15/2016  . 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 Blindness of right eye   . CML (chronic myelocytic leukemia) ([place])   . 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 Drooping eyelid    left eye  . History of migraine headaches    "continuous migraines" per Heme/Onc note 04/2015  . Injury of left shoulder   . Osteoarthritis   . Right knee pain   . Shingles     Family History  Problem Relation Age of Onset  . Cancer Maternal Uncle     bone  . Cancer Paternal Aunt     uterine  . Cancer Maternal Grandmother     breast  . Cancer Maternal Grandfather     throat  . Cancer Paternal Grandmother     leukemia  . Cancer Son     colon    Past Surgical History:  Procedure Laterality Date  . Bone Marrow Bx & Asp    . bowel blockage surgery    . right kidney removed  1980  . Rt. Cataract surgery     Social History   Occupational History  . Not on file.   Social History Main Topics  . Smoking status: Never Smoker  . Smokeless tobacco: Never Used  . Alcohol use No  . Drug use: No  . Sexual activity: No

## 2016-08-04 DIAGNOSIS — C9211 Chronic myeloid leukemia, BCR/ABL-positive, in remission: Secondary | ICD-10-CM | POA: Diagnosis not present

## 2016-08-04 DIAGNOSIS — I480 Paroxysmal atrial fibrillation: Secondary | ICD-10-CM | POA: Diagnosis not present

## 2016-08-05 ENCOUNTER — Ambulatory Visit (HOSPITAL_COMMUNITY)
Admission: RE | Admit: 2016-08-05 | Discharge: 2016-08-05 | Disposition: A | Discharge status: Home / Self Care | Payer: Medicare Other | Source: Ambulatory Visit | Attending: Pulmonary Disease | Admitting: Pulmonary Disease

## 2016-08-05 ENCOUNTER — Telehealth (HOSPITAL_COMMUNITY): Payer: Self-pay | Admitting: *Deleted

## 2016-08-05 DIAGNOSIS — Z1231 Encounter for screening mammogram for malignant neoplasm of breast: Secondary | ICD-10-CM | POA: Insufficient documentation

## 2016-08-05 NOTE — Telephone Encounter (Signed)
Patient states her lab work has been good for 17 years and she needs to postpone labs and follow up until her sister in law has surgery. She states she will call back when she is ready to make an appointment.

## 2016-08-22 ENCOUNTER — Ambulatory Visit (INDEPENDENT_AMBULATORY_CARE_PROVIDER_SITE_OTHER): Payer: Medicare Other | Admitting: Orthopedic Surgery

## 2016-09-01 ENCOUNTER — Ambulatory Visit (INDEPENDENT_AMBULATORY_CARE_PROVIDER_SITE_OTHER): Payer: Medicare Other | Admitting: Orthopedic Surgery

## 2016-09-06 ENCOUNTER — Other Ambulatory Visit (HOSPITAL_COMMUNITY): Payer: Self-pay | Admitting: Orthopedic Surgery

## 2016-09-06 DIAGNOSIS — M75101 Unspecified rotator cuff tear or rupture of right shoulder, not specified as traumatic: Secondary | ICD-10-CM | POA: Diagnosis not present

## 2016-09-06 DIAGNOSIS — M25511 Pain in right shoulder: Secondary | ICD-10-CM

## 2016-09-09 ENCOUNTER — Ambulatory Visit (HOSPITAL_COMMUNITY): Payer: Medicare Other

## 2016-09-12 ENCOUNTER — Ambulatory Visit (HOSPITAL_COMMUNITY)
Admission: RE | Admit: 2016-09-12 | Discharge: 2016-09-12 | Disposition: A | Discharge status: Home / Self Care | Payer: Medicare Other | Source: Ambulatory Visit | Attending: Orthopedic Surgery | Admitting: Orthopedic Surgery

## 2016-09-12 DIAGNOSIS — S46111A Strain of muscle, fascia and tendon of long head of biceps, right arm, initial encounter: Secondary | ICD-10-CM | POA: Diagnosis not present

## 2016-09-12 DIAGNOSIS — M75121 Complete rotator cuff tear or rupture of right shoulder, not specified as traumatic: Secondary | ICD-10-CM | POA: Diagnosis not present

## 2016-09-12 DIAGNOSIS — M25511 Pain in right shoulder: Secondary | ICD-10-CM | POA: Diagnosis not present

## 2016-09-12 DIAGNOSIS — M19011 Primary osteoarthritis, right shoulder: Secondary | ICD-10-CM | POA: Insufficient documentation

## 2016-09-12 DIAGNOSIS — M7551 Bursitis of right shoulder: Secondary | ICD-10-CM | POA: Insufficient documentation

## 2016-09-12 DIAGNOSIS — X58XXXA Exposure to other specified factors, initial encounter: Secondary | ICD-10-CM | POA: Insufficient documentation

## 2016-09-13 DIAGNOSIS — M75101 Unspecified rotator cuff tear or rupture of right shoulder, not specified as traumatic: Secondary | ICD-10-CM | POA: Diagnosis not present

## 2016-09-16 DIAGNOSIS — R809 Proteinuria, unspecified: Secondary | ICD-10-CM | POA: Diagnosis not present

## 2016-09-16 DIAGNOSIS — E559 Vitamin D deficiency, unspecified: Secondary | ICD-10-CM | POA: Diagnosis not present

## 2016-09-16 DIAGNOSIS — N183 Chronic kidney disease, stage 3 (moderate): Secondary | ICD-10-CM | POA: Diagnosis not present

## 2016-09-16 DIAGNOSIS — D509 Iron deficiency anemia, unspecified: Secondary | ICD-10-CM | POA: Diagnosis not present

## 2016-09-16 DIAGNOSIS — Z79899 Other long term (current) drug therapy: Secondary | ICD-10-CM | POA: Diagnosis not present

## 2016-09-20 DIAGNOSIS — R809 Proteinuria, unspecified: Secondary | ICD-10-CM | POA: Diagnosis not present

## 2016-09-20 DIAGNOSIS — I10 Essential (primary) hypertension: Secondary | ICD-10-CM | POA: Diagnosis not present

## 2016-09-20 DIAGNOSIS — D649 Anemia, unspecified: Secondary | ICD-10-CM | POA: Diagnosis not present

## 2016-09-20 DIAGNOSIS — N183 Chronic kidney disease, stage 3 (moderate): Secondary | ICD-10-CM | POA: Diagnosis not present

## 2016-10-03 DIAGNOSIS — Z0000 Encounter for general adult medical examination without abnormal findings: Secondary | ICD-10-CM | POA: Diagnosis not present

## 2016-10-03 DIAGNOSIS — C9290 Myeloid leukemia, unspecified, not having achieved remission: Secondary | ICD-10-CM | POA: Diagnosis not present

## 2016-10-03 DIAGNOSIS — E785 Hyperlipidemia, unspecified: Secondary | ICD-10-CM | POA: Diagnosis not present

## 2016-10-03 DIAGNOSIS — N19 Unspecified kidney failure: Secondary | ICD-10-CM | POA: Diagnosis not present

## 2016-10-03 DIAGNOSIS — I10 Essential (primary) hypertension: Secondary | ICD-10-CM | POA: Diagnosis not present

## 2016-10-19 DIAGNOSIS — J209 Acute bronchitis, unspecified: Secondary | ICD-10-CM | POA: Diagnosis not present

## 2016-10-19 DIAGNOSIS — J309 Allergic rhinitis, unspecified: Secondary | ICD-10-CM | POA: Diagnosis not present

## 2016-10-19 DIAGNOSIS — N183 Chronic kidney disease, stage 3 (moderate): Secondary | ICD-10-CM | POA: Diagnosis not present

## 2016-10-19 DIAGNOSIS — I499 Cardiac arrhythmia, unspecified: Secondary | ICD-10-CM | POA: Diagnosis not present

## 2016-10-19 DIAGNOSIS — I10 Essential (primary) hypertension: Secondary | ICD-10-CM | POA: Diagnosis not present

## 2016-10-27 ENCOUNTER — Ambulatory Visit (INDEPENDENT_AMBULATORY_CARE_PROVIDER_SITE_OTHER): Payer: Medicare Other | Admitting: Cardiovascular Disease

## 2016-10-27 ENCOUNTER — Ambulatory Visit: Payer: Medicare Other | Admitting: Physician Assistant

## 2016-10-27 ENCOUNTER — Encounter: Payer: Self-pay | Admitting: Cardiovascular Disease

## 2016-10-27 VITALS — BP 102/60 | HR 54 | Ht 60.0 in | Wt 117.2 lb

## 2016-10-27 DIAGNOSIS — Z01818 Encounter for other preprocedural examination: Secondary | ICD-10-CM | POA: Diagnosis not present

## 2016-10-27 NOTE — Progress Notes (Signed)
10/27/2016 Ana Nelson   March 14, 1941  [id number]  Primary Physician [name], MD Primary Cardiologist: Lorretta Harp MD Renae Gloss  HPI:  Ana Nelson is a 76 year old thin and frail-appearing married Caucasian female mother of 62, grandmother of 4 grandchildren referred by Dr. Noemi Chapel for preoperative clearance before elective right shoulder surgery. Her primary care provider is Dr. [name]  in [place]. She has no cardiac risk factors. There is no family history. She's never had a heart attack stroke. She denies chest pain or shortness of breath. She does have CML in remission for the last 18 years.   Current Outpatient Prescriptions  Medication Sig Dispense Refill  . ALPRAZolam (XANAX) 1 MG tablet Take 1 mg by mouth 3 (three) times daily as needed for sleep.    . butorphanol (STADOL) 10 MG/ML nasal spray Place 1 spray into the nose every 4 (four) hours as needed (migraines).    . ciprofloxacin (CIPRO) 500 MG tablet Take 1 tablet (500 mg total) by mouth 2 (two) times daily. 20 tablet 0  . Cyanocobalamin (B-12 PO) Take 1 tablet by mouth daily.    Marland Kitchen imatinib (GLEEVEC) 400 MG tablet TAKE 1 TABLET BY MOUTH DAILY WITH FOOD AND A LARGE GLASS OF WATER *ALWAYS USE SECONDARY INS XL HEALTH ON THIS MEDICATION* 30 tablet 5  . levothyroxine (SYNTHROID, LEVOTHROID) 88 MCG tablet Take 88 mcg by mouth daily before breakfast.    . Omega-3 Fatty Acids (FISH OIL PO) Take 1 capsule by mouth daily.    . potassium chloride (K-DUR) 10 MEQ tablet Take 1 tablet (10 mEq total) by mouth 2 (two) times daily. (Patient taking differently: Take 10 mEq by mouth daily. ) 30 tablet 0  . sertraline (ZOLOFT) 50 MG tablet Take 50 mg by mouth daily.     No current facility-administered medications for this visit.     Allergies  Allergen Reactions  . Nubain [Nalbuphine Hcl] Swelling  . Perphenazine     seizures  . Ibuprofen     Interaction with medications   . Tylenol [Acetaminophen]  Other (See Comments)    Interaction with medciations  . Vistaril [Hydroxyzine Hcl] Swelling and Other (See Comments)    Made my tongue swell    Social History   Social History  . Marital status: Married    Spouse name: N/A  . Number of children: N/A  . Years of education: N/A   Occupational History  . Not on file.   Social History Main Topics  . Smoking status: Never Smoker  . Smokeless tobacco: Never Used  . Alcohol use No  . Drug use: No  . Sexual activity: No   Other Topics Concern  . Not on file   Social History Narrative  . No narrative on file     Review of Systems: General: negative for chills, fever, night sweats or weight changes.  Cardiovascular: negative for chest pain, dyspnea on exertion, edema, orthopnea, palpitations, paroxysmal nocturnal dyspnea or shortness of breath Dermatological: negative for rash Respiratory: negative for cough or wheezing Urologic: negative for hematuria Abdominal: negative for nausea, vomiting, diarrhea, bright red blood per rectum, melena, or hematemesis Neurologic: negative for visual changes, syncope, or dizziness All other systems reviewed and are otherwise negative except as noted above.    Blood pressure 102/60, pulse (!) 54, height 5' (1.524 m), weight 117 lb 3.2 oz (53.2 kg).  General appearance: alert and no distress Neck: no adenopathy, no carotid bruit, no JVD,  supple, symmetrical, trachea midline and thyroid not enlarged, symmetric, no tenderness/mass/nodules Lungs: clear to auscultation bilaterally Heart: regular rate and rhythm, S1, S2 normal, no murmur, click, rub or gallop Extremities: extremities normal, atraumatic, no cyanosis or edema  EKG normal sinus rhythm at 65 with septal Q waves. I personally reviewed this EKG.  ASSESSMENT AND PLAN:   Preoperative clearance Ana Nelson was referred by Dr. Noemi Chapel , orthopedic surgeon, for preoperative clearance for right shoulder surgery. She has no prior cardiac  history. She has no risk factors. She has no family history. She's never had a heart attack or stroke. She denies chest pain or shortness of breath. Her exam is benign. Her 12-lead EKG shows no acute changes. I am going to get a 2-D echo to assess LV function. If this is normal we will clear her at low risk.      Lorretta Harp MD FACP,FACC,FAHA, [facility] 10/27/2016 2:02 PM

## 2016-10-27 NOTE — Patient Instructions (Signed)
Medication Instructions:  Your physician recommends that you continue on your current medications as directed. Please refer to the Current Medication list given to you today.  Labwork: NONE  Testing/Procedures: Your physician has requested that you have an echocardiogram. Echocardiography is a painless test that uses sound waves to create images of your heart. It provides your doctor with information about the size and shape of your heart and how well your heart's chambers and valves are working. This procedure takes approximately one hour. There are no restrictions for this procedure.   Follow-Up: Your physician recommends that you schedule a follow-up appointment: AS NEEDED if test is normal.   Any Other Special Instructions Will Be Listed Below (If Applicable).     If you need a refill on your cardiac medications before your next appointment, please call your pharmacy.

## 2016-10-27 NOTE — Assessment & Plan Note (Signed)
Ms. Storey was referred by Dr. Noemi Chapel , orthopedic surgeon, for preoperative clearance for right shoulder surgery. She has no prior cardiac history. She has no risk factors. She has no family history. She's never had a heart attack or stroke. She denies chest pain or shortness of breath. Her exam is benign. Her 12-lead EKG shows no acute changes. I am going to get a 2-D echo to assess LV function. If this is normal we will clear her at low risk.

## 2016-10-31 ENCOUNTER — Other Ambulatory Visit (INDEPENDENT_AMBULATORY_CARE_PROVIDER_SITE_OTHER): Payer: Medicare Other | Admitting: *Deleted

## 2016-10-31 DIAGNOSIS — Z01818 Encounter for other preprocedural examination: Secondary | ICD-10-CM

## 2016-11-07 ENCOUNTER — Other Ambulatory Visit (HOSPITAL_COMMUNITY): Payer: Self-pay | Admitting: Pulmonary Disease

## 2016-11-07 ENCOUNTER — Other Ambulatory Visit (HOSPITAL_COMMUNITY): Payer: Self-pay | Admitting: Oncology

## 2016-11-07 ENCOUNTER — Ambulatory Visit (HOSPITAL_COMMUNITY)
Admission: RE | Admit: 2016-11-07 | Discharge: 2016-11-07 | Disposition: A | Discharge status: Home / Self Care | Payer: Medicare Other | Source: Ambulatory Visit | Attending: Pulmonary Disease | Admitting: Pulmonary Disease

## 2016-11-07 DIAGNOSIS — W19XXXA Unspecified fall, initial encounter: Secondary | ICD-10-CM

## 2016-11-07 DIAGNOSIS — M84475A Pathological fracture, left foot, initial encounter for fracture: Secondary | ICD-10-CM | POA: Diagnosis not present

## 2016-11-07 DIAGNOSIS — R52 Pain, unspecified: Secondary | ICD-10-CM | POA: Diagnosis not present

## 2016-11-07 DIAGNOSIS — X58XXXA Exposure to other specified factors, initial encounter: Secondary | ICD-10-CM | POA: Diagnosis not present

## 2016-11-07 DIAGNOSIS — S299XXA Unspecified injury of thorax, initial encounter: Secondary | ICD-10-CM | POA: Diagnosis not present

## 2016-11-07 DIAGNOSIS — R0781 Pleurodynia: Secondary | ICD-10-CM | POA: Diagnosis not present

## 2016-11-07 DIAGNOSIS — S92352A Displaced fracture of fifth metatarsal bone, left foot, initial encounter for closed fracture: Secondary | ICD-10-CM | POA: Diagnosis not present

## 2016-11-07 DIAGNOSIS — C9210 Chronic myeloid leukemia, BCR/ABL-positive, not having achieved remission: Secondary | ICD-10-CM

## 2016-11-07 MED ORDER — IMATINIB MESYLATE 400 MG PO TABS: ORAL_TABLET | ORAL | 5 refills | Status: DC

## 2016-11-09 ENCOUNTER — Other Ambulatory Visit: Payer: Self-pay

## 2016-11-09 ENCOUNTER — Ambulatory Visit (HOSPITAL_COMMUNITY): Payer: Medicare Other | Attending: Cardiology

## 2016-11-09 DIAGNOSIS — I351 Nonrheumatic aortic (valve) insufficiency: Secondary | ICD-10-CM | POA: Insufficient documentation

## 2016-11-09 DIAGNOSIS — I313 Pericardial effusion (noninflammatory): Secondary | ICD-10-CM | POA: Diagnosis not present

## 2016-11-09 DIAGNOSIS — I5030 Unspecified diastolic (congestive) heart failure: Secondary | ICD-10-CM | POA: Diagnosis not present

## 2016-11-09 DIAGNOSIS — I341 Nonrheumatic mitral (valve) prolapse: Secondary | ICD-10-CM | POA: Diagnosis not present

## 2016-11-09 DIAGNOSIS — I420 Dilated cardiomyopathy: Secondary | ICD-10-CM | POA: Insufficient documentation

## 2016-11-09 DIAGNOSIS — Z01818 Encounter for other preprocedural examination: Secondary | ICD-10-CM

## 2016-11-10 DIAGNOSIS — S92902B Unspecified fracture of left foot, initial encounter for open fracture: Secondary | ICD-10-CM | POA: Diagnosis not present

## 2016-11-14 ENCOUNTER — Encounter: Payer: Self-pay | Admitting: Cardiovascular Disease

## 2016-11-14 DIAGNOSIS — M1711 Unilateral primary osteoarthritis, right knee: Secondary | ICD-10-CM | POA: Diagnosis not present

## 2016-12-07 ENCOUNTER — Ambulatory Visit (INDEPENDENT_AMBULATORY_CARE_PROVIDER_SITE_OTHER): Payer: Self-pay | Admitting: Orthopedic Surgery

## 2016-12-15 ENCOUNTER — Encounter (INDEPENDENT_AMBULATORY_CARE_PROVIDER_SITE_OTHER): Payer: Self-pay | Admitting: Orthopedic Surgery

## 2016-12-15 ENCOUNTER — Ambulatory Visit (INDEPENDENT_AMBULATORY_CARE_PROVIDER_SITE_OTHER): Payer: Medicare Other | Admitting: Orthopedic Surgery

## 2016-12-15 ENCOUNTER — Ambulatory Visit (INDEPENDENT_AMBULATORY_CARE_PROVIDER_SITE_OTHER): Payer: Medicare Other

## 2016-12-15 VITALS — Ht 60.0 in | Wt 117.0 lb

## 2016-12-15 DIAGNOSIS — S92355A Nondisplaced fracture of fifth metatarsal bone, left foot, initial encounter for closed fracture: Secondary | ICD-10-CM | POA: Diagnosis not present

## 2016-12-15 DIAGNOSIS — M1711 Unilateral primary osteoarthritis, right knee: Secondary | ICD-10-CM | POA: Diagnosis not present

## 2016-12-15 NOTE — Progress Notes (Signed)
Office Visit Note   Patient: Ana Nelson           Date of Birth: 1940/10/03           MRN: [id number] Visit Date: 12/15/2016              Requested by: Sinda Du, MD [place], [place] [address] PCP: Sinda Du, MD  Chief Complaint  Patient presents with  . Right Knee - Pain      HPI: Patient is a 76 year old presents complaining of increasing arthritic pain in her right knee she has had steroid injections without relief. Patient states she wants a knee replacement. She has been seen by her cardiologist Dr. Gwenlyn Found and has been cleared for surgical intervention. Patient states that she did have atrial fibrillation but does not have it at this time.  She also states she broke her foot and radiographs are reviewed for her fifth metatarsal fracture left foot.  Assessment & Plan: Visit Diagnoses:  1. Unilateral primary osteoarthritis, right knee   2. Closed nondisplaced fracture of fifth metatarsal bone of left foot, initial encounter     Plan: We'll plan for total knee arthroplasty on the right. Patient does have a husband at home who she states can help her postoperatively plan for discharge to home postoperatively. Risks and benefits of surgery were discussed patient states she understands wish to proceed at this time. Patient is currently not on a blood thinner. Due to the increased valgus alignment we will need to make the femoral cut without valgus and the femoral cut.  Follow-Up Instructions: Return if symptoms worsen or fail to improve.   Ortho Exam  Patient is alert, oriented, no adenopathy, well-dressed, normal affect, normal respiratory effort. Examination patient is an antalgic gait. With her standing she has about 30 valgus alignment to the right knee she has crepitation with range of motion collaterals and cruciates are stable. She has pain to palpation patellofemoral joint as well as mediolateral joint line. There is no  effusion. There is no skin breakdown. Examination of her left foot she is tender to palpation of the base of the fifth metatarsal. Review of the radiographs shows a nondisplaced avulsion fracture of the base of the fifth metatarsal.  Imaging: Xr Knee 1-2 Views Right  Result Date: 12/15/2016 Two-view radiographs of the right knee shows valgus alignment bone-on-bone contact lateral joint line with osteophytic bone spurs of all 3 compartments with subcondylar sclerosis as well as osteophytic bone spurs in the patellofemoral joint. Patient also has peripheral vascular disease with calcification of the popliteal vessel.   Labs: Lab Results  Component Value Date   REPTSTATUS 09/21/2012 FINAL 09/19/2012   CULT INSIGNIFICANT GROWTH 09/19/2012    Orders:  Orders Placed This Encounter  Procedures  . XR Knee 1-2 Views Right   No orders of the defined types were placed in this encounter.    Procedures: No procedures performed  Clinical Data: No additional findings.  ROS:  All other systems negative, except as noted in the HPI. Review of Systems  Objective: Vital Signs: Ht 5' (1.524 m)   Wt 117 lb (53.1 kg)   BMI 22.85 kg/m   Specialty Comments:  No specialty comments available.  PMFS History: Patient Active Problem List   Diagnosis Date Noted  . Preoperative clearance 10/27/2016  . Chronic right shoulder pain 07/22/2016  . Unilateral primary osteoarthritis, right knee 06/15/2016  . 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 Blindness of right eye   . CML (chronic myelocytic leukemia) ([place])   . 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 Drooping eyelid    left eye  . History of migraine headaches    "continuous migraines" per Heme/Onc note 04/2015  . Injury of left shoulder   . Osteoarthritis   . Right knee pain   . Shingles     Family History  Problem Relation Age of Onset  . Cancer Maternal Uncle         bone  . Cancer Paternal Aunt        uterine  . Cancer Maternal Grandmother        breast  . Cancer Maternal Grandfather        throat  . Cancer Paternal Grandmother        leukemia  . Cancer Son        colon    Past Surgical History:  Procedure Laterality Date  . Bone Marrow Bx & Asp    . bowel blockage surgery    . right kidney removed  1980  . Rt. Cataract surgery     Social History   Occupational History  . Not on file.   Social History Main Topics  . Smoking status: Never Smoker  . Smokeless tobacco: Never Used  . Alcohol use No  . Drug use: No  . Sexual activity: No

## 2017-01-02 ENCOUNTER — Other Ambulatory Visit (INDEPENDENT_AMBULATORY_CARE_PROVIDER_SITE_OTHER): Payer: Self-pay | Admitting: Orthopedic Surgery

## 2017-01-02 ENCOUNTER — Other Ambulatory Visit (INDEPENDENT_AMBULATORY_CARE_PROVIDER_SITE_OTHER): Payer: Self-pay | Admitting: Family

## 2017-01-05 ENCOUNTER — Encounter (HOSPITAL_COMMUNITY)
Admission: RE | Admit: 2017-01-05 | Discharge: 2017-01-05 | Disposition: A | Discharge status: Home / Self Care | Payer: Medicare Other | Source: Ambulatory Visit | Attending: Orthopedic Surgery | Admitting: Orthopedic Surgery

## 2017-01-05 ENCOUNTER — Encounter (HOSPITAL_COMMUNITY): Payer: Self-pay

## 2017-01-05 DIAGNOSIS — M1711 Unilateral primary osteoarthritis, right knee: Secondary | ICD-10-CM | POA: Diagnosis not present

## 2017-01-05 DIAGNOSIS — Z01812 Encounter for preprocedural laboratory examination: Secondary | ICD-10-CM | POA: Diagnosis not present

## 2017-01-05 HISTORY — DX: Presence of spectacles and contact lenses: Z97.3

## 2017-01-05 HISTORY — DX: Hypothyroidism, unspecified: E03.9

## 2017-01-05 HISTORY — DX: Major depressive disorder, single episode, unspecified: F32.9

## 2017-01-05 HISTORY — DX: Diverticulitis of intestine, part unspecified, without perforation or abscess without bleeding: K57.92

## 2017-01-05 HISTORY — DX: Personal history of other diseases of the digestive system: Z87.19

## 2017-01-05 HISTORY — DX: Gastro-esophageal reflux disease without esophagitis: K21.9

## 2017-01-05 HISTORY — DX: Personal history of leukemia: Z85.6

## 2017-01-05 HISTORY — DX: Depression, unspecified: F32.A

## 2017-01-05 LAB — BASIC METABOLIC PANEL
Anion gap: 7 (ref 5–15)
BUN: 18 mg/dL (ref 6–20)
CALCIUM: 9.2 mg/dL (ref 8.9–10.3)
CO2: 26 mmol/L (ref 22–32)
CREATININE: 1.61 mg/dL — AB (ref 0.44–1.00)
Chloride: 106 mmol/L (ref 101–111)
GFR, EST AFRICAN AMERICAN: 35 mL/min — AB (ref >60)
GFR, EST NON AFRICAN AMERICAN: 30 mL/min — AB (ref >60)
Glucose, Bld: 92 mg/dL (ref 65–99)
Potassium: 4.3 mmol/L (ref 3.5–5.1)
SODIUM: 139 mmol/L (ref 135–145)

## 2017-01-05 LAB — CBC
HCT: 37.9 % (ref 36.0–46.0)
Hemoglobin: 11.8 g/dL — ABNORMAL LOW (ref 12.0–15.0)
MCH: 32.7 pg (ref 26.0–34.0)
MCHC: 31.1 g/dL (ref 30.0–36.0)
MCV: 105 fL — ABNORMAL HIGH (ref 78.0–100.0)
Platelets: 106 10*3/uL — ABNORMAL LOW (ref 150–400)
RBC: 3.61 MIL/uL — ABNORMAL LOW (ref 3.87–5.11)
RDW: 16 % — AB (ref 11.5–15.5)
WBC: 5 10*3/uL (ref 4.0–10.5)

## 2017-01-05 LAB — SURGICAL PCR SCREEN
MRSA, PCR: NEGATIVE
STAPHYLOCOCCUS AUREUS: NEGATIVE

## 2017-01-05 NOTE — Pre-Procedure Instructions (Signed)
Ana Nelson  01/05/2017      [facility] - [place], [place], [place] [address] [place] [address] Phone: [phone] Fax: [phone]  [id number] [place], [place] - [place] [address] [place] [address] Phone: [phone] Fax: [phone]    Your procedure is scheduled on Wednesday, January 18, 2017  Report to [facility] at 8:20 A.M.  Call this number if you have problems the morning of surgery:  [phone]   Remember:  Do not eat food or drink liquids after midnight Tuesday, January 17, 2017  Take these medicines the morning of surgery with A SIP OF WATER: levothyroxine (SYNTHROID), sertraline (ZOLOFT), if needed: ALPRAZolam Duanne Moron) for anxiety, butorphanol (STADOL) spray for migraines,  HYDROcodone ( Norco) for pain Stop taking Aspirin,vitamins, fish oil and herbal medications. Do not take any NSAIDs ie: Ibuprofen, Motrin, Advil, Naproxen ( Aleve), BC and Goody Powder or any medication containing Aspirin; stop Wednesday, January 11, 2017  Do not wear jewelry, make-up or nail polish.  Do not wear lotions, powders, or perfumes, or deoderant.  Do not shave 48 hours prior to surgery.    Do not bring valuables to the hospital.  [facility] is not responsible for any belongings or valuables.  Contacts, dentures or bridgework may not be worn into surgery.  Leave your suitcase in the car.  After surgery it may be brought to your room.  For patients admitted to the hospital, discharge time will be determined by your treatment team. Special instructions:   [place] - Preparing for Surgery  Before surgery, you can play an important role.  Because skin is not sterile, your skin needs to be as free of germs as possible.  You can reduce the number of germs on you skin by washing with CHG (chlorahexidine gluconate) soap before surgery.  CHG is an antiseptic cleaner which kills germs and bonds  with the skin to continue killing germs even after washing.  Please DO NOT use if you have an allergy to CHG or antibacterial soaps.  If your skin becomes reddened/irritated stop using the CHG and inform your nurse when you arrive at Short Stay.  Do not shave (including legs and underarms) for at least 48 hours prior to the first CHG shower.  You may shave your face.  Please follow these instructions carefully:   1.  Shower with CHG Soap the night before surgery and the morning of Surgery.  2.  If you choose to wash your hair, wash your hair first as usual with your normal shampoo.  3.  After you shampoo, rinse your hair and body thoroughly to remove the Shampoo.  4.  Use CHG as you would any other liquid soap.  You can apply chg directly  to the skin and wash gently with scrungie or a clean washcloth.  5.  Apply the CHG Soap to your body ONLY FROM THE NECK DOWN.  Do not use on open wounds or open sores.  Avoid contact with your eyes, ears, mouth and genitals (private parts).  Wash genitals (private parts) with your normal soap.  6.  Wash thoroughly, paying special attention to the area where your surgery will be performed.  7.  Thoroughly rinse your body with warm water from the neck down.  8.  DO NOT shower/wash with your normal soap after using and rinsing off the CHG Soap.  9.  Pat yourself dry with a clean towel.            10.  Wear clean pajamas.            11.  Place clean sheets on your bed the night of your first shower and do not sleep with pets.  Day of Surgery  Do not apply any lotions/deodorants the morning of surgery.  Please wear clean clothes to the hospital/surgery center.  Please read over the following fact sheets that you were given. Pain Booklet, Coughing and Deep Breathing, Total Joint Packet, MRSA Information and Surgical Site Infection Prevention

## 2017-01-05 NOTE — Progress Notes (Signed)
Pt denies SOB and chest pain. Pt under the care of Dr. Gwenlyn Found, Cardiology. Pt denies having a cardiac cath but stated that a stress test was performed > 10 years ago. Pt denies recent labs. Pt chart forwarded to anesthesia for review.

## 2017-01-09 NOTE — Progress Notes (Signed)
Anesthesia Chart Review:  Pt is a 76 year old female scheduled for R total knee arthroplasty on 01/18/2017 with Meridee Score, MD.   - PCP is Sinda Du, MD - Saw cardiologist Quay Burow, MD 10/27/16 for pre-op eval.  Echo ordered, results below.  Pt cleared for surgery at low risk.   PMH includes:  CML, hypothyroidism, GERD.  Blind in R eye.  Never smoker. BMI 23  Medications include: gleevec, levothyroxine.    Preoperative labs reviewed.  Cr 1.61, BUN 18.  Cr consistent with prior results.   EKG 10/27/16: NSR. Septal infarct, age undetermined.   Echo 11/09/16:  - Left ventricle: The cavity size was normal. Wall thickness was normal. Systolic function was normal. The estimated ejection fraction was in the range of 60% to 65%. Wall motion was normal; there were no regional wall motion abnormalities. Doppler parameters are consistent with abnormal left ventricular relaxation (grade 1 diastolic dysfunction). Doppler parameters are consistent with high ventricular filling pressure. - Aortic valve: There was mild regurgitation. - Mitral valve: There was mild regurgitation. - Left atrium: The atrium was mildly dilated. - Pulmonary arteries: Systolic pressure was mildly increased. PA peak pressure: 40 mm Hg (S). - Pericardium, extracardiac: A trivial pericardial effusion was identified. - Impressions: Normal LV systolic function; mild diastolic dysfuncton with elevated LV filling pressure; mild AI and MR; mild LAE; mild TR with mildly elevated pulmonary pressure.  If no changes, I anticipate pt can proceed with surgery as scheduled.   Willeen Cass, FNP-BC [facility] Short Stay Surgical Center/Anesthesiology Phone: [phone] 01/09/2017 9:44 AM

## 2017-01-16 ENCOUNTER — Encounter (INDEPENDENT_AMBULATORY_CARE_PROVIDER_SITE_OTHER): Payer: Self-pay | Admitting: Family

## 2017-01-16 ENCOUNTER — Ambulatory Visit (INDEPENDENT_AMBULATORY_CARE_PROVIDER_SITE_OTHER): Payer: Medicare Other | Admitting: Family

## 2017-01-16 DIAGNOSIS — M75101 Unspecified rotator cuff tear or rupture of right shoulder, not specified as traumatic: Secondary | ICD-10-CM | POA: Diagnosis not present

## 2017-01-16 DIAGNOSIS — G8929 Other chronic pain: Secondary | ICD-10-CM | POA: Diagnosis not present

## 2017-01-16 DIAGNOSIS — M25511 Pain in right shoulder: Secondary | ICD-10-CM | POA: Diagnosis not present

## 2017-01-16 MED ORDER — METHYLPREDNISOLONE ACETATE 40 MG/ML IJ SUSP
40.0000 mg | INTRAMUSCULAR | Status: AC | PRN
  Administered 2017-01-16: 40 mg via INTRA_ARTICULAR

## 2017-01-16 MED ORDER — LIDOCAINE HCL 1 % IJ SOLN
5.0000 mL | INTRAMUSCULAR | Status: AC | PRN
  Administered 2017-01-16: 5 mL

## 2017-01-16 NOTE — Progress Notes (Signed)
Office Visit Note   Patient: Ana Nelson           Date of Birth: July 11, 1941           MRN: [id number] Visit Date: 01/16/2017              Requested by: Sinda Du, MD [place], [place] [address] PCP: Sinda Du, MD  Chief Complaint  Patient presents with  . Right Shoulder - Pain    Wants an injection      HPI: The patient is a 76 year old woman who presents today complaining of chronic right shoulder pain. She did have good relief from a Depo-Medrol injection in March of this year. States this pain has just returned. She requests repeat injection. She has pain with crossarm reaching as well as above the head reaching.  An MRI from March of this year shows IMPRESSION: Severe appearing rotator cuff tendinopathy with a near complete subscapularis tear and bursal sided tear of the leading edge of the supraspinatus measuring approximately 0.6 cm from front to back as described above. No atrophy of either muscle belly.  Complete tear of the long head of biceps from the superior labrum. A large longitudinal split tear is seen in the superior 2.5 cm of the tendon in the bicipital groove.  Moderately severe acromioclavicular osteoarthritis.  Large volume of subacromial/subdeltoid fluid consistent with bursitis.  Assessment & Plan: Visit Diagnoses:  1. Tear of right rotator cuff, unspecified tear extent   2. Chronic right shoulder pain     Plan: Depo-Medrol injection today. She'll follow-up in office as needed.  Follow-Up Instructions: Return if symptoms worsen or fail to improve.   Right Shoulder Exam   Tenderness  The patient is experiencing tenderness in the biceps tendon.  Range of Motion  Active Abduction: normal  Forward Flexion: normal   Tests  Impingement: positive  Other  Pulse: present      Patient is alert, oriented, no adenopathy, well-dressed, normal affect, normal respiratory  effort.   Imaging: No results found.  Labs: Lab Results  Component Value Date   REPTSTATUS 09/21/2012 FINAL 09/19/2012   CULT INSIGNIFICANT GROWTH 09/19/2012    Orders:  No orders of the defined types were placed in this encounter.  No orders of the defined types were placed in this encounter.    Procedures: Large Joint Inj Date/Time: 01/16/2017 3:44 PM Performed by: Suzan Slick Authorized by: Dondra Prader R   Consent Given by:  Patient Site marked: the procedure site was marked   Timeout: prior to procedure the correct patient, procedure, and site was verified   Indications:  Pain and diagnostic evaluation Location:  Shoulder Site:  R subacromial bursa Prep: patient was prepped and draped in usual sterile fashion   Needle Size:  22 G Needle Length:  1.5 inches Ultrasound Guidance: No   Fluoroscopic Guidance: No   Arthrogram: No   Medications:  5 mL lidocaine 1 %; 40 mg methylPREDNISolone acetate 40 MG/ML Aspiration Attempted: No   Patient tolerance:  Patient tolerated the procedure well with no immediate complications    Clinical Data: No additional findings.  ROS:  All other systems negative, except as noted in the HPI. Review of Systems  Constitutional: Negative for chills and fever.  Musculoskeletal: Positive for arthralgias and myalgias.  Neurological: Negative for weakness and numbness.    Objective: Vital Signs: There were no vitals taken for this visit.  Specialty Comments:  No specialty comments  available.  PMFS History: Patient Active Problem List   Diagnosis Date Noted  . Preoperative clearance 10/27/2016  . Chronic right shoulder pain 07/22/2016  . Unilateral primary osteoarthritis, right knee 06/15/2016  . 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 Blindness of right eye   . CML (chronic myelocytic leukemia) ([place])   . 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  Depression   . Diverticulitis   . Drooping eyelid    left eye  . GERD (gastroesophageal reflux disease)   . H/O: CML (chronic myeloid leukemia)   . History of hiatal hernia   . History of migraine headaches    "continuous migraines" per Heme/Onc note 04/2015  . Hypothyroidism   . Injury of left shoulder   . Osteoarthritis   . Osteoarthritis    right knee  . Right knee pain   . Shingles   . Wears glasses     Family History  Problem Relation Age of Onset  . Cancer Maternal Uncle        bone  . Cancer Paternal Aunt        uterine  . Cancer Maternal Grandmother        breast  . Cancer Maternal Grandfather        throat  . Cancer Paternal Grandmother        leukemia  . Cancer Son        colon  . Stroke Mother   . COPD Father     Past Surgical History:  Procedure Laterality Date  . APPENDECTOMY    . Bone Marrow Bx & Asp    . bowel blockage surgery    . COLONOSCOPY    . right kidney removed  1980  . Rt. Cataract surgery     Social History   Occupational History  . Not on file.   Social History Main Topics  . Smoking status: Never Smoker  . Smokeless tobacco: Never Used  . Alcohol use No  . Drug use: No  . Sexual activity: No

## 2017-01-18 ENCOUNTER — Inpatient Hospital Stay (HOSPITAL_COMMUNITY): Payer: Medicare Other | Admitting: Certified Registered"

## 2017-01-18 ENCOUNTER — Encounter (HOSPITAL_COMMUNITY)
Admission: RE | Disposition: A | Discharge status: Home Health | Payer: Self-pay | Source: Ambulatory Visit | Attending: Orthopedic Surgery

## 2017-01-18 ENCOUNTER — Encounter (HOSPITAL_COMMUNITY): Payer: Self-pay | Admitting: General Practice

## 2017-01-18 ENCOUNTER — Inpatient Hospital Stay (HOSPITAL_COMMUNITY)
Admission: RE | Admit: 2017-01-18 | Discharge: 2017-01-20 | DRG: 470 | Disposition: A | Discharge status: Home Health | Payer: Medicare Other | Source: Ambulatory Visit | Attending: Orthopedic Surgery | Admitting: Orthopedic Surgery

## 2017-01-18 ENCOUNTER — Inpatient Hospital Stay (HOSPITAL_COMMUNITY): Payer: Medicare Other | Admitting: Emergency Medicine

## 2017-01-18 DIAGNOSIS — K219 Gastro-esophageal reflux disease without esophagitis: Secondary | ICD-10-CM | POA: Diagnosis not present

## 2017-01-18 DIAGNOSIS — M25561 Pain in right knee: Secondary | ICD-10-CM | POA: Diagnosis present

## 2017-01-18 DIAGNOSIS — M1711 Unilateral primary osteoarthritis, right knee: Principal | ICD-10-CM

## 2017-01-18 DIAGNOSIS — Z79899 Other long term (current) drug therapy: Secondary | ICD-10-CM | POA: Diagnosis not present

## 2017-01-18 DIAGNOSIS — Z96651 Presence of right artificial knee joint: Secondary | ICD-10-CM

## 2017-01-18 DIAGNOSIS — Z856 Personal history of leukemia: Secondary | ICD-10-CM | POA: Diagnosis not present

## 2017-01-18 DIAGNOSIS — E039 Hypothyroidism, unspecified: Secondary | ICD-10-CM | POA: Diagnosis present

## 2017-01-18 DIAGNOSIS — F329 Major depressive disorder, single episode, unspecified: Secondary | ICD-10-CM | POA: Diagnosis present

## 2017-01-18 DIAGNOSIS — M25511 Pain in right shoulder: Secondary | ICD-10-CM | POA: Diagnosis not present

## 2017-01-18 DIAGNOSIS — G8918 Other acute postprocedural pain: Secondary | ICD-10-CM | POA: Diagnosis not present

## 2017-01-18 HISTORY — PX: TOTAL KNEE ARTHROPLASTY: SHX125

## 2017-01-18 SURGERY — ARTHROPLASTY, KNEE, TOTAL
Anesthesia: Spinal | Site: Knee | Laterality: Right

## 2017-01-18 MED ORDER — LIDOCAINE HCL (CARDIAC) 20 MG/ML IV SOLN
INTRAVENOUS | Status: AC
  Filled 2017-01-18: qty 5

## 2017-01-18 MED ORDER — CEFAZOLIN SODIUM-DEXTROSE 2-4 GM/100ML-% IV SOLN
INTRAVENOUS | Status: AC
  Filled 2017-01-18: qty 100

## 2017-01-18 MED ORDER — ACETAMINOPHEN 650 MG RE SUPP: 650.0000 mg | Freq: Four times a day (QID) | RECTAL | Status: DC | PRN

## 2017-01-18 MED ORDER — OXYCODONE HCL 5 MG PO TABS
5.0000 mg | ORAL_TABLET | ORAL | Status: DC | PRN
  Administered 2017-01-18 – 2017-01-20 (×4): 10 mg via ORAL
  Filled 2017-01-18 (×3): qty 2

## 2017-01-18 MED ORDER — EPHEDRINE SULFATE-NACL 50-0.9 MG/10ML-% IV SOSY
PREFILLED_SYRINGE | INTRAVENOUS | Status: DC | PRN
  Administered 2017-01-18: 10 mg via INTRAVENOUS
  Administered 2017-01-18: 15 mg via INTRAVENOUS
  Administered 2017-01-18: 5 mg via INTRAVENOUS

## 2017-01-18 MED ORDER — 0.9 % SODIUM CHLORIDE (POUR BTL) OPTIME
TOPICAL | Status: DC | PRN
  Administered 2017-01-18: 1000 mL

## 2017-01-18 MED ORDER — ONDANSETRON HCL 4 MG PO TABS: 4.0000 mg | ORAL_TABLET | Freq: Four times a day (QID) | ORAL | Status: DC | PRN

## 2017-01-18 MED ORDER — MENTHOL 3 MG MT LOZG: 1.0000 | LOZENGE | OROMUCOSAL | Status: DC | PRN

## 2017-01-18 MED ORDER — PHENOL 1.4 % MT LIQD: 1.0000 | OROMUCOSAL | Status: DC | PRN

## 2017-01-18 MED ORDER — SERTRALINE HCL 50 MG PO TABS
50.0000 mg | ORAL_TABLET | Freq: Every day | ORAL | Status: DC
  Administered 2017-01-19: 50 mg via ORAL
  Filled 2017-01-18 (×2): qty 1

## 2017-01-18 MED ORDER — LACTATED RINGERS IV SOLN
INTRAVENOUS | Status: DC
  Administered 2017-01-18 (×2): via INTRAVENOUS

## 2017-01-18 MED ORDER — CEFAZOLIN SODIUM-DEXTROSE 1-4 GM/50ML-% IV SOLN
1.0000 g | Freq: Four times a day (QID) | INTRAVENOUS | Status: AC
  Administered 2017-01-18 (×2): 1 g via INTRAVENOUS
  Filled 2017-01-18 (×2): qty 50

## 2017-01-18 MED ORDER — KETOROLAC TROMETHAMINE 15 MG/ML IJ SOLN
7.5000 mg | Freq: Four times a day (QID) | INTRAMUSCULAR | Status: AC
  Administered 2017-01-18 – 2017-01-19 (×4): 7.5 mg via INTRAVENOUS
  Filled 2017-01-18 (×3): qty 1

## 2017-01-18 MED ORDER — FENTANYL CITRATE (PF) 250 MCG/5ML IJ SOLN
INTRAMUSCULAR | Status: AC
  Filled 2017-01-18: qty 5

## 2017-01-18 MED ORDER — METOCLOPRAMIDE HCL 5 MG PO TABS: 5.0000 mg | ORAL_TABLET | Freq: Three times a day (TID) | ORAL | Status: DC | PRN

## 2017-01-18 MED ORDER — SODIUM CHLORIDE 0.9 % IR SOLN
Status: DC | PRN
  Administered 2017-01-18: 3000 mL

## 2017-01-18 MED ORDER — HYDROMORPHONE HCL 1 MG/ML IJ SOLN
1.0000 mg | INTRAMUSCULAR | Status: DC | PRN
  Administered 2017-01-18 – 2017-01-19 (×3): 1 mg via INTRAVENOUS
  Filled 2017-01-18 (×3): qty 1

## 2017-01-18 MED ORDER — MIDAZOLAM HCL 5 MG/5ML IJ SOLN
INTRAMUSCULAR | Status: DC | PRN
  Administered 2017-01-18: 2 mg via INTRAVENOUS

## 2017-01-18 MED ORDER — FENTANYL CITRATE (PF) 100 MCG/2ML IJ SOLN
INTRAMUSCULAR | Status: AC
  Filled 2017-01-18: qty 2

## 2017-01-18 MED ORDER — ONDANSETRON HCL 4 MG/2ML IJ SOLN
INTRAMUSCULAR | Status: AC
  Filled 2017-01-18: qty 2

## 2017-01-18 MED ORDER — PROPOFOL 500 MG/50ML IV EMUL
INTRAVENOUS | Status: DC | PRN
  Administered 2017-01-18: 100 ug/kg/min via INTRAVENOUS

## 2017-01-18 MED ORDER — BUTORPHANOL TARTRATE 10 MG/ML NA SOLN: 1.0000 | NASAL | Status: DC | PRN

## 2017-01-18 MED ORDER — METOCLOPRAMIDE HCL 5 MG/ML IJ SOLN: 5.0000 mg | Freq: Three times a day (TID) | INTRAMUSCULAR | Status: DC | PRN

## 2017-01-18 MED ORDER — ALPRAZOLAM 0.5 MG PO TABS
1.0000 mg | ORAL_TABLET | Freq: Two times a day (BID) | ORAL | Status: DC | PRN
  Administered 2017-01-19: 1 mg via ORAL
  Filled 2017-01-18: qty 2

## 2017-01-18 MED ORDER — CHLORHEXIDINE GLUCONATE 4 % EX LIQD: 60.0000 mL | Freq: Once | CUTANEOUS | Status: DC

## 2017-01-18 MED ORDER — ONDANSETRON HCL 4 MG/2ML IJ SOLN: 4.0000 mg | Freq: Once | INTRAMUSCULAR | Status: DC | PRN

## 2017-01-18 MED ORDER — MIDAZOLAM HCL 2 MG/2ML IJ SOLN
INTRAMUSCULAR | Status: AC
  Administered 2017-01-18: 2 mg
  Filled 2017-01-18: qty 2

## 2017-01-18 MED ORDER — ACETAMINOPHEN 325 MG PO TABS: 650.0000 mg | ORAL_TABLET | Freq: Four times a day (QID) | ORAL | Status: DC | PRN

## 2017-01-18 MED ORDER — FENTANYL CITRATE (PF) 250 MCG/5ML IJ SOLN
INTRAMUSCULAR | Status: DC | PRN
  Administered 2017-01-18 (×2): 50 ug via INTRAVENOUS

## 2017-01-18 MED ORDER — PHENYLEPHRINE HCL 10 MG/ML IJ SOLN
INTRAVENOUS | Status: DC | PRN
  Administered 2017-01-18: 50 ug/min via INTRAVENOUS

## 2017-01-18 MED ORDER — TRANEXAMIC ACID 1000 MG/10ML IV SOLN
INTRAVENOUS | Status: AC | PRN
  Administered 2017-01-18: 2000 mg via TOPICAL

## 2017-01-18 MED ORDER — FENTANYL CITRATE (PF) 100 MCG/2ML IJ SOLN
25.0000 ug | INTRAMUSCULAR | Status: DC | PRN
  Administered 2017-01-18 (×3): 50 ug via INTRAVENOUS

## 2017-01-18 MED ORDER — PROPOFOL 10 MG/ML IV BOLUS
INTRAVENOUS | Status: DC | PRN
  Administered 2017-01-18 (×2): 20 mg via INTRAVENOUS

## 2017-01-18 MED ORDER — IMATINIB MESYLATE 100 MG PO TABS
400.0000 mg | ORAL_TABLET | Freq: Every day | ORAL | Status: DC
  Filled 2017-01-18 (×2): qty 4

## 2017-01-18 MED ORDER — CEFAZOLIN SODIUM-DEXTROSE 2-4 GM/100ML-% IV SOLN
2.0000 g | INTRAVENOUS | Status: AC
  Administered 2017-01-18: 2 g via INTRAVENOUS

## 2017-01-18 MED ORDER — FENTANYL CITRATE (PF) 100 MCG/2ML IJ SOLN
INTRAMUSCULAR | Status: AC
  Administered 2017-01-18: 50 ug via INTRAVENOUS
  Filled 2017-01-18: qty 2

## 2017-01-18 MED ORDER — GLYCOPYRROLATE 0.2 MG/ML IJ SOLN
INTRAMUSCULAR | Status: DC | PRN
  Administered 2017-01-18: 0.2 mg via INTRAVENOUS

## 2017-01-18 MED ORDER — OXYCODONE HCL 5 MG PO TABS
ORAL_TABLET | ORAL | Status: AC
  Administered 2017-01-18: 10 mg via ORAL
  Filled 2017-01-18: qty 2

## 2017-01-18 MED ORDER — LEVOTHYROXINE SODIUM 88 MCG PO TABS
88.0000 ug | ORAL_TABLET | Freq: Every day | ORAL | Status: DC
  Filled 2017-01-18 (×2): qty 1

## 2017-01-18 MED ORDER — FENTANYL CITRATE (PF) 100 MCG/2ML IJ SOLN
INTRAMUSCULAR | Status: AC
  Administered 2017-01-18: 100 ug
  Filled 2017-01-18: qty 2

## 2017-01-18 MED ORDER — ONDANSETRON HCL 4 MG/2ML IJ SOLN
4.0000 mg | Freq: Four times a day (QID) | INTRAMUSCULAR | Status: DC | PRN
Start: 2017-01-18 — End: 2017-01-20

## 2017-01-18 MED ORDER — ASPIRIN EC 325 MG PO TBEC
325.0000 mg | DELAYED_RELEASE_TABLET | Freq: Every day | ORAL | Status: DC
  Filled 2017-01-18: qty 1

## 2017-01-18 MED ORDER — MIDAZOLAM HCL 2 MG/2ML IJ SOLN
INTRAMUSCULAR | Status: AC
  Filled 2017-01-18: qty 2

## 2017-01-18 MED ORDER — ONDANSETRON HCL 4 MG/2ML IJ SOLN
INTRAMUSCULAR | Status: DC | PRN
  Administered 2017-01-18: 4 mg via INTRAVENOUS

## 2017-01-18 MED ORDER — KETOROLAC TROMETHAMINE 15 MG/ML IJ SOLN
INTRAMUSCULAR | Status: AC
  Administered 2017-01-18: 7.5 mg via INTRAVENOUS
  Filled 2017-01-18: qty 1

## 2017-01-18 MED ORDER — SODIUM CHLORIDE 0.9 % IV SOLN
INTRAVENOUS | Status: DC
  Administered 2017-01-18 – 2017-01-19 (×2): via INTRAVENOUS

## 2017-01-18 MED ORDER — PROPOFOL 10 MG/ML IV BOLUS
INTRAVENOUS | Status: AC
  Filled 2017-01-18: qty 20

## 2017-01-18 SURGICAL SUPPLY — 53 items
BLADE SAGITTAL 25.0X1.19X90 (BLADE) ×2 IMPLANT
BLADE SAGITTAL 25.0X1.19X90MM (BLADE) ×1
BLADE SAW SGTL 13X75X1.27 (BLADE) ×3 IMPLANT
BLADE SURG 21 STRL SS (BLADE) ×6 IMPLANT
BNDG COHESIVE 6X5 TAN STRL LF (GAUZE/BANDAGES/DRESSINGS) ×4 IMPLANT
BNDG GAUZE ELAST 4 BULKY (GAUZE/BANDAGES/DRESSINGS) ×3 IMPLANT
BONE CEMENT PALACOSE (Cement) ×6 IMPLANT
BOWL SMART MIX CTS (DISPOSABLE) ×3 IMPLANT
CAP KNEE TOTAL 3 SIGMA ×2 IMPLANT
CEMENT BONE PALACOSE (Cement) ×2 IMPLANT
COVER SURGICAL LIGHT HANDLE (MISCELLANEOUS) ×6 IMPLANT
CUFF TOURNIQUET SINGLE 34IN LL (TOURNIQUET CUFF) ×3 IMPLANT
CUFF TOURNIQUET SINGLE 44IN (TOURNIQUET CUFF) IMPLANT
DRAPE EXTREMITY T 121X128X90 (DRAPE) ×3 IMPLANT
DRAPE HALF SHEET 40X57 (DRAPES) ×6 IMPLANT
DRAPE U-SHAPE 47X51 STRL (DRAPES) ×3 IMPLANT
DRSG ADAPTIC 3X8 NADH LF (GAUZE/BANDAGES/DRESSINGS) ×3 IMPLANT
DRSG PAD ABDOMINAL 8X10 ST (GAUZE/BANDAGES/DRESSINGS) ×3 IMPLANT
DURAPREP 26ML APPLICATOR (WOUND CARE) ×3 IMPLANT
ELECT REM PT RETURN 9FT ADLT (ELECTROSURGICAL) ×3
ELECTRODE REM PT RTRN 9FT ADLT (ELECTROSURGICAL) ×1 IMPLANT
FACESHIELD WRAPAROUND (MASK) ×3 IMPLANT
GAUZE SPONGE 4X4 12PLY STRL (GAUZE/BANDAGES/DRESSINGS) ×3 IMPLANT
GLOVE BIOGEL PI IND STRL 9 (GLOVE) ×1 IMPLANT
GLOVE BIOGEL PI INDICATOR 9 (GLOVE) ×2
GLOVE SURG ORTHO 9.0 STRL STRW (GLOVE) ×3 IMPLANT
GOWN STRL REUS W/ TWL XL LVL3 (GOWN DISPOSABLE) ×2 IMPLANT
GOWN STRL REUS W/TWL XL LVL3 (GOWN DISPOSABLE) ×6
HANDPIECE INTERPULSE COAX TIP (DISPOSABLE) ×3
KIT BASIN OR (CUSTOM PROCEDURE TRAY) ×3 IMPLANT
KIT ROOM TURNOVER OR (KITS) ×3 IMPLANT
MANIFOLD NEPTUNE II (INSTRUMENTS) ×3 IMPLANT
NDL SPNL 18GX3.5 QUINCKE PK (NEEDLE) ×1 IMPLANT
NEEDLE SPNL 18GX3.5 QUINCKE PK (NEEDLE) ×3 IMPLANT
NS IRRIG 1000ML POUR BTL (IV SOLUTION) ×3 IMPLANT
PACK TOTAL JOINT (CUSTOM PROCEDURE TRAY) ×3 IMPLANT
PACK UNIVERSAL I (CUSTOM PROCEDURE TRAY) ×3 IMPLANT
PAD ARMBOARD 7.5X6 YLW CONV (MISCELLANEOUS) ×3 IMPLANT
SET HNDPC FAN SPRY TIP SCT (DISPOSABLE) ×1 IMPLANT
STAPLER VISISTAT 35W (STAPLE) ×3 IMPLANT
SUCTION FRAZIER HANDLE 10FR (MISCELLANEOUS)
SUCTION TUBE FRAZIER 10FR DISP (MISCELLANEOUS) IMPLANT
SUT VIC AB 0 CT1 27 (SUTURE) ×3
SUT VIC AB 0 CT1 27XBRD ANBCTR (SUTURE) ×1 IMPLANT
SUT VIC AB 1 CTX 36 (SUTURE)
SUT VIC AB 1 CTX36XBRD ANBCTR (SUTURE) IMPLANT
SYR 50ML LL SCALE MARK (SYRINGE) ×3 IMPLANT
TOWEL OR 17X24 6PK STRL BLUE (TOWEL DISPOSABLE) ×3 IMPLANT
TOWEL OR 17X26 10 PK STRL BLUE (TOWEL DISPOSABLE) ×3 IMPLANT
TRAY CATH 16FR W/PLASTIC CATH (SET/KITS/TRAYS/PACK) IMPLANT
TRAY FOLEY W/METER SILVER 16FR (SET/KITS/TRAYS/PACK) IMPLANT
WRAP KNEE MAXI GEL POST OP (GAUZE/BANDAGES/DRESSINGS) ×3 IMPLANT
YANKAUER SUCT BULB TIP NO VENT (SUCTIONS) ×2 IMPLANT

## 2017-01-18 NOTE — Anesthesia Postprocedure Evaluation (Signed)
Anesthesia Post Note  Patient: Ana Nelson  Procedure(s) Performed: Procedure(s) (LRB): RIGHT TOTAL KNEE ARTHROPLASTY (Right)     Patient location during evaluation: PACU Anesthesia Type: Spinal Level of consciousness: awake, awake and alert and oriented Pain management: pain level controlled Vital Signs Assessment: post-procedure vital signs reviewed and stable Respiratory status: spontaneous breathing Cardiovascular status: blood pressure returned to baseline Anesthetic complications: no    Last Vitals:  Vitals:   01/18/17 1330 01/18/17 1345  BP: (!) 117/54 (!) 93/50  Pulse: (!) 51 (!) 57  Resp: 13 10  Temp:  36.6 C    Last Pain:  Vitals:   01/18/17 1345  TempSrc:   PainSc: 4                  [name]

## 2017-01-18 NOTE — Transfer of Care (Signed)
Immediate Anesthesia Transfer of Care Note  Patient: Ana Nelson  Procedure(s) Performed: Procedure(s): RIGHT TOTAL KNEE ARTHROPLASTY (Right)  Patient Location: PACU  Anesthesia Type:Spinal  Level of Consciousness: drowsy and patient cooperative  Airway & Oxygen Therapy: Patient Spontanous Breathing and Patient connected to face mask oxygen  Post-op Assessment: Report given to RN and Post -op Vital signs reviewed and stable  Post vital signs: Reviewed and stable  Last Vitals:  Vitals:   01/18/17 1209 01/18/17 1210  BP:  (!) 87/38  Pulse:  (!) 59  Resp:  15  Temp: 36.5 C     Last Pain:  Vitals:   01/18/17 1209  TempSrc: Tympanic         Complications: No apparent anesthesia complications

## 2017-01-18 NOTE — Anesthesia Procedure Notes (Signed)
Spinal  Patient location during procedure: OR Start time: 01/18/2017 11:20 AM End time: 01/18/2017 11:25 AM Staffing Anesthesiologist: Linna Caprice, [facility] Performed: anesthesiologist  Preanesthetic Checklist Completed: patient identified, site marked, surgical consent, pre-op evaluation, timeout performed, IV checked, risks and benefits discussed and monitors and equipment checked Spinal Block Patient position: sitting Prep: ChloraPrep Patient monitoring: heart rate, cardiac monitor, continuous pulse ox and blood pressure Approach: right paramedian Location: L3-4 Injection technique: single-shot Needle Needle type: Tuohy  Needle gauge: 22 G Needle length: 9 cm Needle insertion depth: 7 cm Assessment Sensory level: T6 Additional Notes 10 mg 0.75% Bupivacaine injected easily

## 2017-01-18 NOTE — Discharge Instructions (Signed)

## 2017-01-18 NOTE — Evaluation (Signed)
Physical Therapy Evaluation Patient Details Name: Ana Nelson MRN: [id number] DOB: 31-Mar-1941 Today's Date: 01/18/2017   History of Present Illness  Pt is a 76 y/o female s/p elective R TKA. PMH includes chronic myelolytic leukemia, migraines, depression and s/p R kidney removal.   Clinical Impression  Pt is s/p surgery above with deficits below. PTA, pt reports she was independent with ambulation. Upon evaluation, noted increased drainage from incision site, therefore, mobility tolerance limited; see general comments section. RN and MD aware. Pt requiring min guard to min A for mobility this session. Follow up recommendations per MD arrangements. Pt reports she has not received her 3 in 1 yet, so will need for d/c home. Will continue to follow acutely and progress mobility according to pt tolerance.     Follow Up Recommendations DC plan and follow up therapy as arranged by surgeon;Supervision/Assistance - 24 hour    Equipment Recommendations  3in1 (PT)    Recommendations for Other Services       Precautions / Restrictions Precautions Precautions: Knee Precaution Booklet Issued: Yes (comment) Precaution Comments: Verbally reviewed exercise handout with pt. Unable to perform secondary to increased drainage from incision site; RN notified.  Restrictions Weight Bearing Restrictions: Yes RLE Weight Bearing: Weight bearing as tolerated      Mobility  Bed Mobility Overal bed mobility: Needs Assistance Bed Mobility: Supine to Sit     Supine to sit: Min assist     General bed mobility comments: Min A for RLE management. Verbal cues for sequencing. Use of bed rails and elevated HOB.   Transfers Overall transfer level: Needs assistance Equipment used: Rolling walker (2 wheeled) Transfers: Sit to/from [facility] Sit to Stand: Min assist Stand pivot transfers: Min guard       General transfer comment: Min A for lift assist and for steadying upon standing.  Verbal cues for appropriate hand placement. Min guard for stand pivot transfer to [facility]. Verbal cues for sequencing.   Ambulation/Gait Ambulation/Gait assistance: Min guard Ambulation Distance (Feet): 15 Feet Assistive device: Rolling walker (2 wheeled) Gait Pattern/deviations: Step-to pattern;Decreased step length - right;Decreased step length - left;Decreased weight shift to right;Antalgic Gait velocity: Decreased Gait velocity interpretation: Below normal speed for age/gender General Gait Details: Slow, antalgic gait secondary to post op pain and weakness. Verbal cues for sequencing and appropriate proximity to device throughout. Noted increased drainage from incision site during ambulation, therefore returned to sitting in recliner. Further mobility deferred; RN notified.   Stairs            Wheelchair Mobility    Modified Rankin (Stroke Patients Only)       Balance Overall balance assessment: Needs assistance Sitting-balance support: No upper extremity supported;Feet supported Sitting balance-Leahy Scale: Good     Standing balance support: Bilateral upper extremity supported;During functional activity Standing balance-Leahy Scale: Poor Standing balance comment: Reliant on RW for stability                              Pertinent Vitals/Pain Pain Assessment: 0-10 Pain Score: 4  Pain Location: R knee Pain Descriptors / Indicators: Aching;Operative site guarding Pain Intervention(s): Limited activity within patient's tolerance;Monitored during session;Repositioned    Home Living Family/patient expects to be discharged to:: Unsure Living Arrangements: Spouse/significant other Available Help at Discharge: Family;Available 24 hours/day Type of Home: House Home Access: Level entry     Home Layout: One level Home Equipment: Walker - 2 wheels  Prior Function Level of Independence: Independent               Hand Dominance   Dominant Hand:  Right    Extremity/Trunk Assessment   Upper Extremity Assessment Upper Extremity Assessment: Defer to OT evaluation    Lower Extremity Assessment Lower Extremity Assessment: RLE deficits/detail RLE Deficits / Details: Deficits consistent with post op pain and weakness. Sensory in tact. Increased drainage with mobility from incision site.     Cervical / Trunk Assessment Cervical / Trunk Assessment: Kyphotic  Communication   Communication: No difficulties  Cognition Arousal/Alertness: Awake/alert Behavior During Therapy: WFL for tasks assessed/performed Overall Cognitive Status: Within Functional Limits for tasks assessed                                        General Comments General comments (skin integrity, edema, etc.): Pt requesting to go to bathroom upon entry. Noted increased drainage from incision site; notified RN. RN came in to reinforced and cleared for mobility stating MD was aware. During gait, drainage increased, therefore, ended gait training and notified RN. RN to contact MD.     Exercises     Assessment/Plan    PT Assessment Patient needs continued PT services  PT Problem List Decreased strength;Decreased activity tolerance;Decreased range of motion;Decreased balance;Decreased mobility;Decreased knowledge of use of DME;Decreased knowledge of precautions;Pain       PT Treatment Interventions DME instruction;Gait training;Functional mobility training;Therapeutic activities;Therapeutic exercise;Balance training;Neuromuscular re-education;Patient/family education    PT Goals (Current goals can be found in the Care Plan section)  Acute Rehab PT Goals Patient Stated Goal: to go home  PT Goal Formulation: With patient Time For Goal Achievement: 01/25/17 Potential to Achieve Goals: Good    Frequency 7X/week   Barriers to discharge        Co-evaluation               AM-PAC PT "6 Clicks" Daily Activity  Outcome Measure Difficulty  turning over in bed (including adjusting bedclothes, sheets and blankets)?: A Little Difficulty moving from lying on back to sitting on the side of the bed? : Total Difficulty sitting down on and standing up from a chair with arms (e.g., wheelchair, bedside commode, etc,.)?: Total Help needed moving to and from a bed to chair (including a wheelchair)?: A Little Help needed walking in hospital room?: A Little Help needed climbing 3-5 steps with a railing? : A Lot 6 Click Score: 13    End of Session Equipment Utilized During Treatment: Gait belt Activity Tolerance: Treatment limited secondary to medical complications (Comment) (increased drainage from incision site) Patient left: in chair;with call bell/phone within reach;with nursing/sitter in room;with family/visitor present Nurse Communication: Mobility status;Other (comment) (increased drainage ) PT Visit Diagnosis: Other abnormalities of gait and mobility (R26.89);Pain Pain - Right/Left: Right Pain - part of body: Knee    Time: 2952-8413 PT Time Calculation (min) (ACUTE ONLY): 25 min   Charges:   PT Evaluation $PT Eval Low Complexity: 1 Procedure PT Treatments $Gait Training: 8-22 mins   PT G Codes:        Leighton Ruff, PT, DPT  Acute Rehabilitation Services  Pager: [phone]   Rudean Hitt 01/18/2017, 7:09 PM

## 2017-01-18 NOTE — Anesthesia Procedure Notes (Addendum)
Anesthesia Regional Block: Adductor canal block   Pre-Anesthetic Checklist: ,, timeout performed, Correct Patient, Correct Site, Correct Laterality, Correct Procedure, Correct Position, site marked, Risks and benefits discussed,  Surgical consent,  Pre-op evaluation,  At surgeon's request and post-op pain management  Laterality: Right  Prep: chloraprep       Needles:   Needle Type: Echogenic Stimulator Needle          Additional Needles:   Procedures: ultrasound guided,,,,,,,,  Narrative:  Start time: 01/18/2017 10:20 AM End time: 01/18/2017 10:35 AM Injection made incrementally with aspirations every 5 mL.  Performed by: Personally   Additional Notes: 30 cc 0.5% bupivacaine with 1:200 epi injected easily

## 2017-01-18 NOTE — Anesthesia Preprocedure Evaluation (Addendum)
Anesthesia Evaluation  Patient identified by MRN, date of birth, ID band Patient awake    Reviewed: Allergy & Precautions, NPO status , Patient's Chart, lab work & pertinent test results  Airway Mallampati: II  TM Distance: >3 FB Neck ROM: Full    Dental  (+) Teeth Intact, Dental Advisory Given   Pulmonary    breath sounds clear to auscultation       Cardiovascular  Rhythm:Regular Rate:Normal     Neuro/Psych    GI/Hepatic   Endo/Other    Renal/GU      Musculoskeletal   Abdominal   Peds  Hematology   Anesthesia Other Findings   Reproductive/Obstetrics                             Anesthesia Physical Anesthesia Plan  ASA: III  Anesthesia Plan: Spinal   Post-op Pain Management:    Induction:   PONV Risk Score and Plan: Ondansetron and Dexamethasone  Airway Management Planned: Natural Airway  Additional Equipment:   Intra-op Plan:   Post-operative Plan:   Informed Consent: I have reviewed the patients History and Physical, chart, labs and discussed the procedure including the risks, benefits and alternatives for the proposed anesthesia with the patient or authorized representative who has indicated his/her understanding and acceptance.   Dental advisory given  Plan Discussed with: CRNA and Anesthesiologist  Anesthesia Plan Comments:         Anesthesia Quick Evaluation

## 2017-01-18 NOTE — H&P (Signed)
TOTAL KNEE ADMISSION H&P  Patient is being admitted for right total knee arthroplasty.  Subjective:  Chief Complaint:right knee pain.  HPI: Ana Nelson, 76 y.o. female, has a history of pain and functional disability in the right knee due to arthritis and has failed non-surgical conservative treatments for greater than 12 weeks to includeNSAID's and/or analgesics, corticosteriod injections and activity modification.  Onset of symptoms was gradual, starting 8 years ago with gradually worsening course since that time. The patient noted no past surgery on the right knee(s).  Patient currently rates pain in the right knee(s) at 8 out of 10 with activity. Patient has night pain, worsening of pain with activity and weight bearing, pain that interferes with activities of daily living, pain with passive range of motion, crepitus and joint swelling.  Patient has evidence of subchondral cysts, subchondral sclerosis, periarticular osteophytes, joint subluxation and joint space narrowing by imaging studies. This patient has had avascular necrosis of the knee. There is no active infection.  Patient Active Problem List   Diagnosis Date Noted  . Preoperative clearance 10/27/2016  . Chronic right shoulder pain 07/22/2016  . Unilateral primary osteoarthritis, right knee 06/15/2016  . 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 Blindness of right eye   . CML (chronic myelocytic leukemia) ([place])   . 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 Depression   . Diverticulitis   . Drooping eyelid    left eye  . GERD (gastroesophageal reflux disease)   . H/O: CML (chronic myeloid leukemia)   . History of hiatal hernia   . History of migraine headaches    "continuous migraines" per Heme/Onc note 04/2015  . Hypothyroidism   . Injury of left shoulder   . Osteoarthritis   . Osteoarthritis    right knee  . Right knee pain   . Shingles   . Wears  glasses     Past Surgical History:  Procedure Laterality Date  . APPENDECTOMY    . Bone Marrow Bx & Asp    . bowel blockage surgery    . COLONOSCOPY    . right kidney removed  1980  . Rt. Cataract surgery      No prescriptions prior to admission.   Allergies  Allergen Reactions  . Nubain [Nalbuphine Hcl] Swelling    Tongue swells  . Perphenazine     seizures  . Vistaril [Hydroxyzine Hcl] Swelling and Other (See Comments)    TONGUE SWELLS    Social History  Substance Use Topics  . Smoking status: Never Smoker  . Smokeless tobacco: Never Used  . Alcohol use No    Family History  Problem Relation Age of Onset  . Cancer Maternal Uncle        bone  . Cancer Paternal Aunt        uterine  . Cancer Maternal Grandmother        breast  . Cancer Maternal Grandfather        throat  . Cancer Paternal Grandmother        leukemia  . Cancer Son        colon  . Stroke Mother   . COPD Father      Review of Systems  All other systems reviewed and are negative.   Objective:  Physical Exam  Vital signs in last 24 hours:    Labs:   Estimated body mass index is 22.74 kg/m as calculated from the  following:   Height as of 12/15/16: 5' (1.524 m).   Weight as of 01/05/17: 116 lb 7 oz (52.8 kg).   Imaging Review Plain radiographs demonstrate moderate degenerative joint disease of the right knee(s). The overall alignment ismild varus. The bone quality appears to be adequate for age and reported activity level.  Assessment/Plan:  End stage arthritis, right knee   The patient history, physical examination, clinical judgment of the provider and imaging studies are consistent with end stage degenerative joint disease of the right knee(s) and total knee arthroplasty is deemed medically necessary. The treatment options including medical management, injection therapy arthroscopy and arthroplasty were discussed at length. The risks and benefits of total knee arthroplasty were  presented and reviewed. The risks due to aseptic loosening, infection, stiffness, patella tracking problems, thromboembolic complications and other imponderables were discussed. The patient acknowledged the explanation, agreed to proceed with the plan and consent was signed. Patient is being admitted for inpatient treatment for surgery, pain control, PT, OT, prophylactic antibiotics, VTE prophylaxis, progressive ambulation and ADL's and discharge planning. The patient is planning to be discharged home with home health services

## 2017-01-18 NOTE — Op Note (Signed)
DATE OF SURGERY:  01/18/2017  TIME: 11:57 AM  PATIENT NAME:  Ana Nelson    AGE: 76 y.o.    PRE-OPERATIVE DIAGNOSIS:  Osteoarthritis Right Knee  POST-OPERATIVE DIAGNOSIS:  Osteoarthritis Right Knee  PROCEDURE:  Procedure(s): RIGHT TOTAL KNEE ARTHROPLASTY  SURGEON: Meridee Score  ASSISTANT: April Green  OPERATIVE IMPLANTS: Depuy , Posterior Stabilized.  Femur size 5, Tibia size 5, Patella size 35 3-peg oval button, with a 5 mm polyethylene insert.   PREOPERATIVE INDICATIONS:   ELLARY CASAMENTO is a 76 y.o. year old female with end stage degenerative arthritis of the knee who failed conservative treatment and elected for Total Knee Arthroplasty.   The risks, benefits, and alternatives were discussed at length including but not limited to the risks of infection, bleeding, nerve injury, stiffness, blood clots, the need for revision surgery, cardiopulmonary complications, among others, and they were willing to proceed.  OPERATIVE DESCRIPTION:  The patient was brought to the operative room and placed in a supine position.  General anesthesia was administered.  IV antibiotics were given.  The lower extremity was prepped and draped in the usual sterile fashion.  Charlie Pitter was used to cover all exposed skin. Time out was performed.    Anterior quadriceps tendon splitting approach was performed.  The patella was everted and osteophytes were removed.  The anterior horn of the medial and lateral meniscus was removed.   The distal femur was opened with the drill and the intramedullary distal femoral cutting jig was utilized, set at 5 degrees valgus resecting 9 mm off the distal femur.  Care was taken to protect the collateral ligaments.  Then the extramedullary tibial cutting jig was utilized making the appropriate cut using the anterior tibial crest as a reference building in appropriate posterior slope.  Care was taken during the cut to protect the medial and collateral ligaments.  The  proximal tibia was removed along with the posterior horns of the menisci.  The PCL was sacrificed.    The extensor gap was measured and was approximately 22mm.    The distal femoral sizing jig was applied, taking care to avoid notching.  Then the 4-in-1 cutting jig was applied and the anterior and posterior femur was cut, along with the chamfer cuts.  All posterior osteophytes were removed.  The flexion gap was then measured and was symmetric with the extension gap.  The distal femoral preparation using the appropriate jig to prepare the box.  The patella was then measured, and cut with the saw.    The proximal tibia sized and prepared accordingly with the reamer and the punch, and then all components were trialed with the 11mm poly insert.  The knee was found to have stable balance and full motion.  The knee was irrigated with normal saline and the knee was soaked with TXA.  The above named components were then cemented into place and all excess cement was removed.  The final polyethylene component was in place during cementation.  The knee was  taken through a range of motion and the patella tracked well and the knee irrigated copiously and the parapatellar and subcutaneous tissue closed with vicryl, and skin closed with staples..  A sterile dressing was applied and patient  was taken to the PACU in stable  condition.  There were no complications.  Total tourniquet time was 25 minutes.

## 2017-01-18 NOTE — Plan of Care (Signed)
Problem: Pain Managment: Goal: General experience of comfort will improve Outcome: Progressing Pt controlled with prn pain meds. Pt asks appropriately when needed.

## 2017-01-19 ENCOUNTER — Encounter (HOSPITAL_COMMUNITY): Payer: Self-pay | Admitting: Orthopedic Surgery

## 2017-01-19 MED ORDER — BUTORPHANOL TARTRATE 10 MG/ML NA SOLN
1.0000 | NASAL | Status: DC | PRN
  Administered 2017-01-19: 1 via NASAL
  Filled 2017-01-19: qty 2.5

## 2017-01-19 MED ORDER — BUTORPHANOL TARTRATE 10 MG/ML NA SOLN: 1.0000 | NASAL | Status: DC | PRN

## 2017-01-19 NOTE — Progress Notes (Signed)
Physical Therapy Treatment Patient Details Name: Ana Nelson MRN: [id number] DOB: 08/22/1940 Today's Date: 01/19/2017    History of Present Illness Pt is a 76 y/o female s/p elective R TKA. PMH includes chronic myelolytic leukemia, migraines, depression and s/p R kidney removal.     PT Comments    Pt limited due to pain, was given pain meds prior to Tx. VC's required for bed mobility, transfers and gait. Informed case manager that pt was inquiring about when she will receive BSC for home. Next Tx plan to increase gait distance.  Follow Up Recommendations  DC plan and follow up therapy as arranged by surgeon;Supervision/Assistance - 24 hour     Equipment Recommendations  3in1 (PT)    Recommendations for Other Services       Precautions / Restrictions Precautions Precautions: Knee Precaution Booklet Issued: Yes (comment) Precaution Comments: reviewed precautions Restrictions Weight Bearing Restrictions: Yes RLE Weight Bearing: Weight bearing as tolerated    Mobility  Bed Mobility Overal bed mobility: Needs Assistance Bed Mobility: Supine to Sit;Sit to Supine     Supine to sit: Min assist;HOB elevated Sit to supine: Min guard;HOB elevated   General bed mobility comments: has adjustable bed at home. min A supine>EOB for RLE. Pt able to self-assist with RLE EOB>supine. Spouse included in education.   Transfers Overall transfer level: Needs assistance Equipment used: Rolling walker (2 wheeled) Transfers: Sit to/from Stand Sit to Stand: Min assist;Min guard Stand pivot transfers: Min guard       General transfer comment: cues for hand placement. close min guard to steady. From EOB and 3n1.   Ambulation/Gait Ambulation/Gait assistance: Min guard Ambulation Distance (Feet): 50 Feet Assistive device: Rolling walker (2 wheeled) Gait Pattern/deviations: Step-to pattern;Drifts right/left;Trunk flexed;Decreased step length - left;Decreased step length - right Gait  velocity: Decreased   General Gait Details: slow, VC's for technique with AD, VC's to look forward and not at feet    Stairs            Wheelchair Mobility    Modified Rankin (Stroke Patients Only)       Balance Overall balance assessment: Needs assistance Sitting-balance support: Feet supported Sitting balance-Leahy Scale: Good     Standing balance support: Bilateral upper extremity supported;During functional activity Standing balance-Leahy Scale: Poor Standing balance comment: Reliant on RW for stability                             Cognition Arousal/Alertness: Awake/alert Behavior During Therapy: WFL for tasks assessed/performed Overall Cognitive Status: Within Functional Limits for tasks assessed                                        Exercises Total Joint Exercises Ankle Circles/Pumps: AROM;Both;20 reps;Supine Quad Sets: AROM;Right;10 reps;Supine Towel Squeeze: AROM;Both;10 reps;Supine Short Arc Quad: AROM;Right;10 reps;Supine Heel Slides: AROM;Both;5 reps;Supine Hip ABduction/ADduction: AAROM;Supine;Right;10 reps Goniometric ROM: 54 degrees R knee flexion    General Comments        Pertinent Vitals/Pain Pain Assessment: 0-10 Pain Score: 6  Pain Location: headache and R knee Pain Descriptors / Indicators: Aching;Sore Pain Intervention(s): Limited activity within patient's tolerance;Monitored during session;Repositioned;Ice applied    Home Living Family/patient expects to be discharged to:: Private residence Living Arrangements: Spouse/significant other Available Help at Discharge: Family;Available 24 hours/day Type of Home: House Home Access: Level entry   Home  Layout: One level Home Equipment: Walker - 2 wheels;Shower seat (adjustable bed) Additional Comments: cannot take rw into bathroom. shower doors that will not come off.     Prior Function Level of Independence: Independent          PT Goals (current goals  can now be found in the care plan section) Acute Rehab PT Goals Patient Stated Goal: to go home  PT Goal Formulation: With patient Potential to Achieve Goals: Good Progress towards PT goals: Progressing toward goals    Frequency    7X/week      PT Plan Current plan remains appropriate    Co-evaluation              AM-PAC PT "6 Clicks" Daily Activity  Outcome Measure  Difficulty turning over in bed (including adjusting bedclothes, sheets and blankets)?: A Little Difficulty moving from lying on back to sitting on the side of the bed? : A Lot Difficulty sitting down on and standing up from a chair with arms (e.g., wheelchair, bedside commode, etc,.)?: Total Help needed moving to and from a bed to chair (including a wheelchair)?: A Little Help needed walking in hospital room?: A Little Help needed climbing 3-5 steps with a railing? : A Lot 6 Click Score: 14    End of Session Equipment Utilized During Treatment: Gait belt Activity Tolerance: Patient limited by pain Patient left: in bed;with call bell/phone within reach;with family/visitor present   PT Visit Diagnosis: Other abnormalities of gait and mobility (R26.89);Pain Pain - Right/Left: Right Pain - part of body: Knee     Time: 1119-1150 PT Time Calculation (min) (ACUTE ONLY): 31 min  Charges:  $Gait Training: 8-22 mins $Therapeutic Exercise: 8-22 mins                    G Codes:       Khara Renaud, SPTA [id number]    Samamtha Tiegs 01/19/2017, 2:06 PM

## 2017-01-19 NOTE — Progress Notes (Signed)
Dr. Erlinda Hong on call for Dr. Sharol Given paged regarding pt unable to void since last in and out cath at 1000. Bladder scan reveals 255mL. Awaiting call back.

## 2017-01-19 NOTE — Progress Notes (Signed)
Physical Therapy Treatment Patient Details Name: Ana Nelson MRN: [id number] DOB: 24-Jun-1941 Today's Date: 01/19/2017    History of Present Illness Pt is a 76 y/o female s/p elective R TKA. PMH includes chronic myelolytic leukemia, migraines, depression and s/p R Nelson removal.     PT Comments    Pt limited due to pain, refused to ambulate but agreed to do exercises in bed. Pt requesting pain meds for headache, notified the nurse. Next Tx to gait train with increased distance.   Follow Up Recommendations  DC plan and follow up therapy as arranged by surgeon;Supervision/Assistance - 24 hour     Equipment Recommendations  3in1 (PT)    Recommendations for Other Services       Precautions / Restrictions      Mobility  Bed Mobility                  Transfers                    Ambulation/Gait                Stairs            Wheelchair Mobility    Modified Rankin (Stroke Patients Only)       Balance                                            Cognition Arousal/Alertness: Awake/alert Behavior During Therapy: WFL for tasks assessed/performed Overall Cognitive Status: Within Functional Limits for tasks assessed                                        Exercises Total Joint Exercises Ankle Circles/Pumps: AROM;20 reps;10 reps;Supine;Both Quad Sets: AROM;10 reps;Supine;Right Towel Squeeze: AROM;10 reps;Supine;Right Short Arc Quad: AROM;10 reps;Supine;Right Heel Slides: AROM;10 reps;Supine;Right Hip ABduction/ADduction: AROM;10 reps;Supine;Right    General Comments        Pertinent Vitals/Pain Pain Assessment: 0-10 Pain Score: 9  Pain Location: headache and R knee Pain Descriptors / Indicators: Aching;Sore;Headache Pain Intervention(s): Limited activity within patient's tolerance;Ice applied;Patient requesting pain meds-RN notified    Home Living                      Prior  Function            PT Goals (current goals can now be found in the care plan section) Acute Rehab PT Goals Patient Stated Goal: to stop the pain (headache)  PT Goal Formulation: With patient Time For Goal Achievement: 01/25/17 Potential to Achieve Goals: Good    Frequency    7X/week      PT Plan Current plan remains appropriate    Co-evaluation              AM-PAC PT "6 Clicks" Daily Activity  Outcome Measure  Difficulty turning over in bed (including adjusting bedclothes, sheets and blankets)?: A Little Difficulty moving from lying on back to sitting on the side of the bed? : A Lot Difficulty sitting down on and standing up from a chair with arms (e.g., wheelchair, bedside commode, etc,.)?: Total Help needed moving to and from a bed to chair (including a wheelchair)?: A Little Help needed walking in hospital room?: A Little Help needed climbing 3-5 steps with  a railing? : A Lot 6 Click Score: 14    End of Session   Activity Tolerance: Patient limited by pain   Nurse Communication: Other (comment) (Pt requesting headache medication, notified nurse) PT Visit Diagnosis: Other abnormalities of gait and mobility (R26.89);Pain Pain - Right/Left: Right Pain - part of body: Knee     Time: 3837-7939 PT Time Calculation (min) (ACUTE ONLY): 15 min  Charges:  $Therapeutic Exercise: 8-22 mins                    G Codes:       Rami Budhu, SPTA [id number]    Diyari Cherne 01/19/2017, 4:35 PM

## 2017-01-19 NOTE — Plan of Care (Signed)
Problem: Safety: Goal: Ability to remain free from injury will improve Outcome: Progressing Patient ambulated to and from the bathroom safelyseveral times this shift. Patient educated on falls precaution. Patient compliant.  Problem: Pain Managment: Goal: General experience of comfort will improve Outcome: Progressing Patient pain managed with IV dilaudid this shift. Patient unable to take Oxy with tylenol as it interferes with cancer medication. Will continue to monitor and update team.

## 2017-01-19 NOTE — Care Management Note (Signed)
Case Management Note  Patient Details  Name: DAYLAH SAYAVONG MRN: [id number] Date of Birth: [birth year]  Subjective/Objective:  76 yr old female s/p right total knee arthroplasty.            Action/Plan: Case manager spoke with patient and her husband concerning discharge plan and DME needs. Choice for Home Health agency was offered, referral was called to Stevie Kern, [facility] Liaison. Patient says she has rolling Walker, needs 3in1. She will have family support at discharge.    Expected Discharge Date:   01/20/17               Expected Discharge Plan:  [place]  In-House Referral:  NA  Discharge planning Services  CM Consult  Post Acute Care Choice:  Durable Medical Equipment, Home Health Choice offered to:  Patient, Spouse  DME Arranged:  3-N-1 (has RW) DME Agency:  [place]:  PT [place]:  [place]  Status of Service:  Completed, signed off  If discussed at [place] of Stay Meetings, dates discussed:    Additional Comments:  Ninfa Meeker, RN 01/19/2017, 2:12 PM

## 2017-01-19 NOTE — Progress Notes (Signed)
Dr. Erlinda Hong returned page regarding in and out cath. MD says that since pt is asymptomatic with bladder scan 222mL, continue to wait a few hours and see if she can void on her own. If she starts to feel that she needs to void but cannot, then insert Foley catheter. Order to be placed.

## 2017-01-19 NOTE — Progress Notes (Signed)
Dr Sharol Given notified that pt cannot take aspirin or tylenol while on Gleevec. MD stated that pt should be put on TED hose and to try to stay away from frequent narcotic use. Pt made aware.

## 2017-01-19 NOTE — Progress Notes (Signed)
Patient ID: Ana Nelson, female   DOB: 09-03-1940, 76 y.o.   MRN: [id number] Postoperative day 1 total knee arthroplasty. Patient states that her knee does not hurt she states she does have a headache from stress. Possible discharge to home on Friday if safe with ambulation today.

## 2017-01-19 NOTE — Progress Notes (Signed)
Patient tried using the bathroom three times to urinate and was unsuccessful. Bladder scan showed 783mls. Team notified. New order given to straight cath patient. 724mls output with straight cath. Will continue to monitor.

## 2017-01-19 NOTE — Evaluation (Signed)
Occupational Therapy Evaluation Patient Details Name: Ana Nelson MRN: [id number] DOB: 1941/06/28 Today's Date: 01/19/2017    History of Present Illness Pt is a 76 y/o female s/p elective R TKA. PMH includes chronic myelolytic leukemia, migraines, depression and s/p R kidney removal.    Clinical Impression   Pt admitted with the above diagnoses and presents with below problem list. Pt will benefit from continued acute OT to address the below listed deficits and maximize independence with basic ADLs prior to d/c home with spouse assisting. PTA pt was independent with ADLs. Pt is currently setup to min guard/min A with most ADLs. Spouse present and included in session.       Follow Up Recommendations  DC plan and follow up therapy as arranged by surgeon    Equipment Recommendations  3 in 1 bedside commode    Recommendations for Other Services       Precautions / Restrictions Precautions Precautions: Knee Precaution Booklet Issued: Yes (comment) Precaution Comments: reviewed precautions Restrictions Weight Bearing Restrictions: Yes RLE Weight Bearing: Weight bearing as tolerated      Mobility Bed Mobility Overal bed mobility: Needs Assistance Bed Mobility: Supine to Sit;Sit to Supine     Supine to sit: Min assist;HOB elevated Sit to supine: Min guard;HOB elevated   General bed mobility comments: has adjustable bed at home. min A supine>EOB for RLE. Pt able to self-assist with RLE EOB>supine. Spouse included in education.   Transfers Overall transfer level: Needs assistance Equipment used: Rolling walker (2 wheeled) Transfers: Sit to/from Stand Sit to Stand: Min assist;Min guard Stand pivot transfers: Min guard       General transfer comment: cues for hand placement. close min guard to steady. From EOB and 3n1.     Balance Overall balance assessment: Needs assistance Sitting-balance support: Feet supported Sitting balance-Leahy Scale: Good     Standing  balance support: Bilateral upper extremity supported;During functional activity Standing balance-Leahy Scale: Poor Standing balance comment: Reliant on RW for stability                            ADL either performed or assessed with clinical judgement   ADL Overall ADL's : Needs assistance/impaired Eating/Feeding: Set up;Sitting   Grooming: Min guard;Set up;Sitting;Standing   Upper Body Bathing: Set up;Sitting   Lower Body Bathing: Min guard;Sit to/from stand   Upper Body Dressing : Set up;Sitting   Lower Body Dressing: Min guard;Sit to/from stand   Toilet Transfer: Min guard;Ambulation;RW (3n1 over toilet)   Toileting- Clothing Manipulation and Hygiene: Min guard;Sit to/from Nurse, children's Details (indicate cue type and reason): pt plans to sponge bath initially due to having shower doors which will likely impede compensatory stragies for tub transfer to shower seat.  Functional mobility during ADLs: Min guard;Rolling walker General ADL Comments: Pt completed bed mobility and toilet transfer. Educated on ADL strategies. Spouse present and involved.      Vision Baseline Vision/History: Wears glasses       Perception     Praxis      Pertinent Vitals/Pain Pain Assessment: 0-10 Pain Score: 6  Pain Location: headache and R knee Pain Descriptors / Indicators: Aching;Sore Pain Intervention(s): Limited activity within patient's tolerance;Monitored during session;Repositioned;Ice applied     Hand Dominance Right   Extremity/Trunk Assessment Upper Extremity Assessment Upper Extremity Assessment: Overall WFL for tasks assessed   Lower Extremity Assessment Lower Extremity Assessment: Defer to PT evaluation  Cervical / Trunk Assessment Cervical / Trunk Assessment: Kyphotic   Communication Communication Communication: No difficulties   Cognition Arousal/Alertness: Awake/alert Behavior During Therapy: WFL for tasks  assessed/performed Overall Cognitive Status: Within Functional Limits for tasks assessed                                     General Comments       Exercises Total Joint Exercises Ankle Circles/Pumps: AROM;Both;20 reps;Supine Quad Sets: AROM;Right;10 reps;Supine Towel Squeeze: AROM;Both;10 reps;Supine Short Arc Quad: AROM;Right;10 reps;Supine Heel Slides: AROM;Both;5 reps;Supine Hip ABduction/ADduction: AAROM;Supine;Right;10 reps Goniometric ROM: 54 degrees R knee flexion   Shoulder Instructions      Home Living Family/patient expects to be discharged to:: Private residence Living Arrangements: Spouse/significant other Available Help at Discharge: Family;Available 24 hours/day Type of Home: House Home Access: Level entry     Home Layout: One level     Bathroom Shower/Tub: Teacher, early years/pre: Standard Bathroom Accessibility: No   Home Equipment: Environmental consultant - 2 wheels;Shower seat (adjustable bed)   Additional Comments: cannot take rw into bathroom. shower doors that will not come off.       Prior Functioning/Environment Level of Independence: Independent                 OT Problem List: Impaired balance (sitting and/or standing);Decreased knowledge of use of DME or AE;Decreased knowledge of precautions;Pain      OT Treatment/Interventions: Self-care/ADL training;DME and/or AE instruction;Therapeutic activities;Patient/family education;Balance training    OT Goals(Current goals can be found in the care plan section) Acute Rehab OT Goals Patient Stated Goal: to go home  OT Goal Formulation: With patient/family Time For Goal Achievement: 01/26/17 Potential to Achieve Goals: Good ADL Goals Pt Will Perform Lower Body Bathing: with modified independence;sit to/from stand Pt Will Perform Lower Body Dressing: with modified independence;sit to/from stand Pt Will Transfer to Toilet: with modified independence Pt Will Perform Toileting -  Clothing Manipulation and hygiene: with modified independence Additional ADL Goal #1: Pt will complete bed mobility at mod I level to prepare for OOB ADLs.   OT Frequency: Min 2X/week   Barriers to D/C:            Co-evaluation              AM-PAC PT "6 Clicks" Daily Activity     Outcome Measure Help from another person eating meals?: None Help from another person taking care of personal grooming?: None Help from another person toileting, which includes using toliet, bedpan, or urinal?: A Little Help from another person bathing (including washing, rinsing, drying)?: A Little Help from another person to put on and taking off regular upper body clothing?: None Help from another person to put on and taking off regular lower body clothing?: A Little 6 Click Score: 21   End of Session Equipment Utilized During Treatment: Rolling walker  Activity Tolerance: Patient tolerated treatment well;Patient limited by pain Patient left: in bed;with call bell/phone within reach;with family/visitor present  OT Visit Diagnosis: Pain Pain - Right/Left: Right Pain - part of body: Knee                Time: 2355-7322 OT Time Calculation (min): 22 min Charges:  OT General Charges $OT Visit: 1 Procedure OT Evaluation $OT Eval Low Complexity: 1 Procedure G-Codes:       Hortencia Pilar 01/19/2017, 12:52 PM

## 2017-01-19 NOTE — Progress Notes (Signed)
Offered Pt a bath. Pt stated not right now. Tech asked Pt to notify her when she is ready to get a bath. Pt acknowledged.

## 2017-01-20 ENCOUNTER — Encounter (HOSPITAL_COMMUNITY): Payer: Self-pay

## 2017-01-20 MED ORDER — ASPIRIN 325 MG PO TBEC: 325.0000 mg | DELAYED_RELEASE_TABLET | Freq: Every day | ORAL | 0 refills | Status: DC

## 2017-01-20 MED ORDER — HYDROCODONE-ACETAMINOPHEN 7.5-325 MG PO TABS: 1.0000 | ORAL_TABLET | ORAL | 0 refills | Status: DC | PRN

## 2017-01-20 NOTE — Discharge Summary (Signed)
Discharge Diagnoses:  Active Problems:   Total knee replacement status, right   Surgeries: Procedure(s): RIGHT TOTAL KNEE ARTHROPLASTY on 01/18/2017    Consultants:   Discharged Condition: Improved  Hospital Course: Ana Nelson is an 76 y.o. female who was admitted 01/18/2017 with a chief complaint of osteoarthritis right knee, with a final diagnosis of Osteoarthritis Right Knee.  Patient was brought to the operating room on 01/18/2017 and underwent Procedure(s): RIGHT TOTAL KNEE ARTHROPLASTY.    Patient was given perioperative antibiotics: Anti-infectives    Start     Dose/Rate Route Frequency Ordered Stop   01/18/17 1730  ceFAZolin (ANCEF) IVPB 1 g/50 mL premix     1 g 100 mL/hr over 30 Minutes Intravenous Every 6 hours 01/18/17 1547 01/19/17 0000   01/18/17 0811  ceFAZolin (ANCEF) 2-4 GM/100ML-% IVPB    Comments:  Merryl Hacker   : cabinet override      01/18/17 0811 01/18/17 1048   01/18/17 0810  ceFAZolin (ANCEF) IVPB 2g/100 mL premix     2 g 200 mL/hr over 30 Minutes Intravenous On call to O.R. 01/18/17 0810 01/18/17 1048    .  Patient was given sequential compression devices, early ambulation, and aspirin for DVT prophylaxis.  Recent vital signs: Patient Vitals for the past 24 hrs:  BP Temp Temp src Pulse Resp SpO2  01/20/17 0455 (!) 163/48 99.3 F (37.4 C) Oral 90 18 99 %  01/19/17 2129 (!) 147/56 99.8 F (37.7 C) Oral 82 18 100 %  01/19/17 1412 (!) 122/38 99.5 F (37.5 C) Oral 81 18 100 %  01/19/17 1034 (!) 132/40 98.5 F (36.9 C) Oral 80 20 100 %  .  Recent laboratory studies: No results found.  Discharge Medications:   Allergies as of 01/20/2017      Reactions   Nubain [nalbuphine Hcl] Swelling   Tongue swells   Perphenazine    seizures   Vistaril [hydroxyzine Hcl] Swelling, Other (See Comments)   TONGUE SWELLS      Medication List    STOP taking these medications   ciprofloxacin 500 MG tablet Commonly known as:  CIPRO   potassium chloride  10 MEQ tablet Commonly known as:  K-DUR     TAKE these medications   ALPRAZolam 1 MG tablet Commonly known as:  XANAX Take 1 mg by mouth 2 (two) times daily as needed for anxiety or sleep.   aspirin 325 MG EC tablet Take 1 tablet (325 mg total) by mouth daily with breakfast.   butorphanol 10 MG/ML nasal spray Commonly known as:  STADOL Place 1 spray into the nose every 4 (four) hours as needed for migraine (migraines).   HYDROcodone-acetaminophen 7.5-325 MG tablet Commonly known as:  NORCO Take 1 tablet by mouth every 4 (four) hours as needed for moderate pain. What changed:  when to take this   imatinib 400 MG tablet Commonly known as:  GLEEVEC TAKE 1 TABLET BY MOUTH DAILY WITH FOOD AND A LARGE GLASS OF WATER *ALWAYS USE SECONDARY INS XL HEALTH ON THIS MEDICATION* What changed:  how much to take  how to take this  when to take this  additional instructions   levothyroxine 88 MCG tablet Commonly known as:  SYNTHROID, LEVOTHROID Take 88 mcg by mouth daily before breakfast.   lidocaine 5 % Commonly known as:  LIDODERM Place 1 patch onto the skin daily as needed (pain). Remove & Discard patch within 12 hours or as directed by MD   loperamide 2 MG  capsule Commonly known as:  IMODIUM Take 2 mg by mouth as needed for diarrhea or loose stools.   sertraline 50 MG tablet Commonly known as:  ZOLOFT Take 50 mg by mouth daily.            Durable Medical Equipment        Start     Ordered   01/19/17 1404  For home use only DME 3 n 1  Once     01/19/17 1403      Diagnostic Studies: No results found.  Patient benefited maximally from their hospital stay and there were no complications.     Disposition: 01-Home or Self Care Discharge Instructions    Call MD / Call 911    Complete by:  As directed    If you experience chest pain or shortness of breath, CALL 911 and be transported to the hospital emergency room.  If you develope a fever above 101 F, pus (white  drainage) or increased drainage or redness at the wound, or calf pain, call your surgeon's office.   Constipation Prevention    Complete by:  As directed    Drink plenty of fluids.  Prune juice may be helpful.  You may use a stool softener, such as Colace (over the counter) 100 mg twice a day.  Use MiraLax (over the counter) for constipation as needed.   Diet - low sodium heart healthy    Complete by:  As directed    Increase activity slowly as tolerated    Complete by:  As directed      Follow-up Information    Newt Minion, MD Follow up in 1 week(s).   Specialty:  Orthopedic Surgery Contact information: [place] [address] [phone]        [facility] Follow [facility].   Why:  A representative from [facility] will contact you to arrange start date and time for your therapy. Contact information: [address] [place] [address] [phone]            Signed: Newt Minion 01/20/2017, 6:47 AM

## 2017-01-20 NOTE — Progress Notes (Signed)
Bladder scan reveals 542 ml ; assisted pt per request with co-staff RN on the bedside commode and pt voided.

## 2017-01-20 NOTE — Progress Notes (Signed)
Patient ID: Ana Nelson, female   DOB: [birth year], 76 y.o.   MRN: [id number] Patient states she feels comfortable discharge to home. Orders written for discharge to home today after therapy.

## 2017-01-20 NOTE — Progress Notes (Signed)
Physical Therapy Treatment Patient Details Name: Ana Nelson MRN: [id number] DOB: 11/19/1940 Today's Date: 01/20/2017    History of Present Illness Pt is a 76 y/o female s/p elective R TKA. PMH includes chronic myelolytic leukemia, migraines, depression and s/p R kidney removal.     PT Comments    Pt limited due to pain and feeling dizzy. Pt increased gait distance, still requires VC's for gait training and transfers. Pt is scheduled to discharge this am.  Informed nursing that OT is assigned for tx before d/c home.   Follow Up Recommendations  DC plan and follow up therapy as arranged by surgeon;Supervision/Assistance - 24 hour     Equipment Recommendations  3in1 (PT)    Recommendations for Other Services       Precautions / Restrictions Precautions Precautions: Knee Precaution Booklet Issued: Yes (comment) Precaution Comments: reviewed precautions Restrictions Weight Bearing Restrictions: Yes RLE Weight Bearing: Weight bearing as tolerated    Mobility  Bed Mobility Overal bed mobility: Needs Assistance Bed Mobility: Supine to Sit;Sit to Supine     Supine to sit: Min assist;HOB elevated Sit to supine: HOB elevated;Min guard   General bed mobility comments: Has adjustable bed at home.  Pt able to self-assist with RLE sit to supine. Spouse included in education.   Transfers Overall transfer level: Needs assistance Equipment used: Rolling walker (2 wheeled) Transfers: Sit to/from Stand Sit to Stand: Min assist         General transfer comment: Cues for hand placement, advancement for RLE  Ambulation/Gait Ambulation/Gait assistance: Min guard Ambulation Distance (Feet): 100 Feet Assistive device: Rolling walker (2 wheeled) Gait Pattern/deviations: Step-to pattern;Step-through pattern;Decreased step length - right;Decreased step length - left;Shuffle Gait velocity: Decreased   General Gait Details: VC's on posture, looking forward, heel strike/to off,  step- throught pattern with AD   Stairs            Wheelchair Mobility    Modified Rankin (Stroke Patients Only)       Balance Overall balance assessment: Needs assistance Sitting-balance support: Feet supported Sitting balance-Leahy Scale: Good     Standing balance support: Bilateral upper extremity supported;During functional activity Standing balance-Leahy Scale: Poor Standing balance comment: Reliant on RW for stability                             Cognition Arousal/Alertness: Awake/alert Behavior During Therapy: WFL for tasks assessed/performed Overall Cognitive Status: Within Functional Limits for tasks assessed                                        Exercises Total Joint Exercises Ankle Circles/Pumps: AROM;20 reps;10 reps;Supine;Both Quad Sets: AROM;10 reps;Supine;Right Towel Squeeze: AROM;10 reps;Supine;Right Short Arc Quad: AROM;10 reps;Supine;Right Heel Slides: AROM;10 reps;Supine;Right Hip ABduction/ADduction: AROM;10 reps;Supine;Right Goniometric ROM: 15-60 degrees R knee    General Comments        Pertinent Vitals/Pain Pain Assessment: 0-10 Pain Score: 5  Pain Location: R knee  Pain Descriptors / Indicators: Aching;Sore Pain Intervention(s): Ice applied;Limited activity within patient's tolerance    Home Living                      Prior Function            PT Goals (current goals can now be found in the care plan section) Acute Rehab PT Goals  Patient Stated Goal: to go home PT Goal Formulation: With patient Time For Goal Achievement: 01/25/17 Potential to Achieve Goals: Good Progress towards PT goals: Progressing toward goals    Frequency    7X/week      PT Plan Current plan remains appropriate    Co-evaluation              AM-PAC PT "6 Clicks" Daily Activity  Outcome Measure  Difficulty turning over in bed (including adjusting bedclothes, sheets and blankets)?: A  Little Difficulty moving from lying on back to sitting on the side of the bed? : A Lot Difficulty sitting down on and standing up from a chair with arms (e.g., wheelchair, bedside commode, etc,.)?: Total Help needed moving to and from a bed to chair (including a wheelchair)?: A Little Help needed walking in hospital room?: A Little Help needed climbing 3-5 steps with a railing? : A Lot 6 Click Score: 14    End of Session Equipment Utilized During Treatment: Gait belt Activity Tolerance: Patient limited by fatigue Patient left: in bed;with call bell/phone within reach;with family/visitor present   PT Visit Diagnosis: Other abnormalities of gait and mobility (R26.89);Pain Pain - Right/Left: Right Pain - part of body: Knee     Time: 0900-0930 PT Time Calculation (min) (ACUTE ONLY): 30 min  Charges:  $Gait Training: 8-22 mins $Therapeutic Exercise: 8-22 mins                    G Codes:       Ana Nelson, SPTA [phone]    Ana Nelson 01/20/2017, 10:09 AM

## 2017-01-20 NOTE — Progress Notes (Signed)
Occupational Therapy Treatment Patient Details Name: Ana Nelson MRN: [id number] DOB: [birth year] Today's Date: 01/20/2017    History of present illness Pt is a 76 y/o female s/p elective R TKA. PMH includes chronic myelolytic leukemia, migraines, depression and s/p R kidney removal.    OT comments  Pt at adequate level for d/c home with spouse (A). Pt and spouse able to complete dressing this admission without questions.  .  Follow Up Recommendations  DC plan and follow up therapy as arranged by surgeon    Equipment Recommendations  3 in 1 bedside commode    Recommendations for Other Services      Precautions / Restrictions Precautions Precautions: Knee       Mobility Bed Mobility Overal bed mobility: Modified Independent                Transfers                      Balance                                           ADL either performed or assessed with clinical judgement   ADL Overall ADL's : Needs assistance/impaired                     Lower Body Dressing: Minimal assistance Lower Body Dressing Details (indicate cue type and reason): spouse help don pants and pt/spouse without questions         Tub/ Shower Transfer: Tub transfer;3 in 1;Ambulation Tub/Shower Transfer Details (indicate cue type and reason): educated on sequence position, adjusting the 3n1 height for transfer. pt has shower doors and recommend the doors removed temporay to allow easier access to the transfer.      Pt educated on bathing and avoid washing directly on incision. Pt educated to use new wash cloth and towel each day. Pt educated to allow water to run across dressing and not to soak in a tub at this time. Pt advised RN will instruct on any bandages required otherwise is open to air.        Vision       Perception     Praxis      Cognition Arousal/Alertness: Awake/alert Behavior During Therapy: WFL for tasks  assessed/performed Overall Cognitive Status: Within Functional Limits for tasks assessed                                          Exercises     Shoulder Instructions       General Comments dressing dry and intact    Pertinent Vitals/ Pain       Pain Assessment: Faces Faces Pain Scale: Hurts a little bit Pain Location: R knee  Pain Descriptors / Indicators: Operative site guarding Pain Intervention(s): Ice applied;Monitored during session;Premedicated before session;Repositioned  Home Living                                          Prior Functioning/Environment              Frequency  Min 2X/week        Progress Toward Goals  OT Goals(current goals can now be found in the care plan section)  Progress towards OT goals: Goals met/education completed, patient discharged from OT  Acute Rehab OT Goals Patient Stated Goal: to go home OT Goal Formulation: With patient/family Time For Goal Achievement: 01/26/17 ADL Goals Pt Will Perform Lower Body Bathing: with modified independence;sit to/from stand Pt Will Perform Lower Body Dressing: with modified independence;sit to/from stand Pt Will Transfer to Toilet: with modified independence Pt Will Perform Toileting - Clothing Manipulation and hygiene: with modified independence Additional ADL Goal #1: Pt will complete bed mobility at mod I level to prepare for OOB ADLs.   Plan Discharge plan remains appropriate    Co-evaluation                 AM-PAC PT "6 Clicks" Daily Activity     Outcome Measure   Help from another person eating meals?: None Help from another person taking care of personal grooming?: None Help from another person toileting, which includes using toliet, bedpan, or urinal?: A Little Help from another person bathing (including washing, rinsing, drying)?: A Little Help from another person to put on and taking off regular upper body clothing?: None Help from  another person to put on and taking off regular lower body clothing?: A Little 6 Click Score: 21    End of Session    OT Visit Diagnosis: Pain Pain - Right/Left: Right Pain - part of body: Knee   Activity Tolerance Patient tolerated treatment well   Patient Left in bed;with call bell/phone within reach;with family/visitor present   Nurse Communication Mobility status;Precautions        Time: 3557-3378 OT Time Calculation (min): 18 min  Charges: OT General Charges $OT Visit: 1 Procedure OT Treatments $Self Care/Home Management : 8-22 mins   Jeri Modena   OTR/L Pager: [phone] Office: [phone] .    Parke Poisson B 01/20/2017, 3:22 PM

## 2017-01-21 DIAGNOSIS — Z96651 Presence of right artificial knee joint: Secondary | ICD-10-CM | POA: Diagnosis not present

## 2017-01-21 DIAGNOSIS — M25511 Pain in right shoulder: Secondary | ICD-10-CM | POA: Diagnosis not present

## 2017-01-21 DIAGNOSIS — Z79891 Long term (current) use of opiate analgesic: Secondary | ICD-10-CM | POA: Diagnosis not present

## 2017-01-21 DIAGNOSIS — E039 Hypothyroidism, unspecified: Secondary | ICD-10-CM | POA: Diagnosis not present

## 2017-01-21 DIAGNOSIS — C9110 Chronic lymphocytic leukemia of B-cell type not having achieved remission: Secondary | ICD-10-CM | POA: Diagnosis not present

## 2017-01-21 DIAGNOSIS — K219 Gastro-esophageal reflux disease without esophagitis: Secondary | ICD-10-CM | POA: Diagnosis not present

## 2017-01-21 DIAGNOSIS — Z471 Aftercare following joint replacement surgery: Secondary | ICD-10-CM | POA: Diagnosis not present

## 2017-01-23 ENCOUNTER — Telehealth (INDEPENDENT_AMBULATORY_CARE_PROVIDER_SITE_OTHER): Payer: Self-pay | Admitting: Orthopedic Surgery

## 2017-01-23 DIAGNOSIS — Z96651 Presence of right artificial knee joint: Secondary | ICD-10-CM | POA: Diagnosis not present

## 2017-01-23 DIAGNOSIS — K219 Gastro-esophageal reflux disease without esophagitis: Secondary | ICD-10-CM | POA: Diagnosis not present

## 2017-01-23 DIAGNOSIS — M25511 Pain in right shoulder: Secondary | ICD-10-CM | POA: Diagnosis not present

## 2017-01-23 DIAGNOSIS — E039 Hypothyroidism, unspecified: Secondary | ICD-10-CM | POA: Diagnosis not present

## 2017-01-23 DIAGNOSIS — Z471 Aftercare following joint replacement surgery: Secondary | ICD-10-CM | POA: Diagnosis not present

## 2017-01-23 DIAGNOSIS — Z79891 Long term (current) use of opiate analgesic: Secondary | ICD-10-CM | POA: Diagnosis not present

## 2017-01-23 DIAGNOSIS — C9110 Chronic lymphocytic leukemia of B-cell type not having achieved remission: Secondary | ICD-10-CM | POA: Diagnosis not present

## 2017-01-23 NOTE — Telephone Encounter (Signed)
Left voicemail advising Ana Nelson patient can apply a dry dressing daily and ace bandage is ok to wear for compression. Do this until follow up with Dr. Sharol Given.

## 2017-01-23 NOTE — Telephone Encounter (Signed)
Rec'd voicemail message from Karn Cassis advised took off ace bandage. Amy want to know if she should redress knee or wait until patient see Dr. Sharol Given. The number to contact Amy is [phone]

## 2017-01-25 ENCOUNTER — Ambulatory Visit (INDEPENDENT_AMBULATORY_CARE_PROVIDER_SITE_OTHER): Payer: Medicare Other

## 2017-01-25 ENCOUNTER — Encounter (INDEPENDENT_AMBULATORY_CARE_PROVIDER_SITE_OTHER): Payer: Self-pay | Admitting: Family

## 2017-01-25 ENCOUNTER — Ambulatory Visit (INDEPENDENT_AMBULATORY_CARE_PROVIDER_SITE_OTHER): Payer: Medicare Other | Admitting: Family

## 2017-01-25 VITALS — Ht 61.0 in | Wt 116.0 lb

## 2017-01-25 DIAGNOSIS — Z96651 Presence of right artificial knee joint: Secondary | ICD-10-CM

## 2017-01-25 MED ORDER — OXYCODONE HCL 5 MG PO TABS: 5.0000 mg | ORAL_TABLET | ORAL | 0 refills | Status: DC | PRN

## 2017-01-25 NOTE — Progress Notes (Signed)
Post-Op Visit Note   Patient: Ana Nelson           Date of Birth: 12-06-1940           MRN: [id number] Visit Date: 01/25/2017 PCP: Sinda Du, MD  Chief Complaint:  Chief Complaint  Patient presents with  . Right Knee - Follow-up    HPI:  The patient is a 76 year old woman who is 1 week status post right total knee arthroplasty. She does complain that her hydrocodone interferes with her cancer medications and is requesting pain medication without Tylenol. She's been wearing TED hose thigh-high around-the-clock. States that she is getting around walking holding onto her husband. [facility] is doing 3 times weekly physical therapy.    Ortho Exam Incision well proximally with staples there is no drainage no surrounding erythema no odor no gaping no sign of infection. She lacks about 20 of full extension and has flexion to 80  Visit Diagnoses:  1. Total knee replacement status, right     Plan: Follow-up in office in one more week for staple removal. Then in 3 weeks with repeat radiographs.  Follow-Up Instructions: Return in about 1 week (around 02/01/2017).   Imaging: Xr Knee 1-2 Views Right  Result Date: 01/25/2017 Radiographs of the right knee show stable alignment of total knee arthroplasty.   Orders:  Orders Placed This Encounter  Procedures  . XR Knee 1-2 Views Right   Meds ordered this encounter  Medications  . oxyCODONE (ROXICODONE) 5 MG immediate release tablet    Sig: Take 1 tablet (5 mg total) by mouth every 4 (four) hours as needed for severe pain.    Dispense:  40 tablet    Refill:  0     PMFS History: Patient Active Problem List   Diagnosis Date Noted  . 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 Preoperative clearance 10/27/2016  . Chronic right shoulder pain 07/22/2016  . Unilateral primary osteoarthritis, right knee 06/15/2016  . CML (chronic myelocytic leukemia) ([place]) 05/04/2012   Past Medical History:  Diagnosis  Date  . Blindness of right eye   . CML (chronic myelocytic leukemia) ([place])   . CML (chronic myelocytic leukemia) ([place]) 05/04/2012  . Depression   . Diverticulitis   . Drooping eyelid    left eye  . GERD (gastroesophageal reflux disease)   . H/O: CML (chronic myeloid leukemia)   . History of hiatal hernia   . History of migraine headaches    "continuous migraines" per Heme/Onc note 04/2015  . Hypothyroidism   . Injury of left shoulder   . Osteoarthritis   . Osteoarthritis    right knee  . Right knee pain   . Shingles   . Wears glasses     Family History  Problem Relation Age of Onset  . Cancer Maternal Uncle        bone  . Cancer Paternal Aunt        uterine  . Cancer Maternal Grandmother        breast  . Cancer Maternal Grandfather        throat  . Cancer Paternal Grandmother        leukemia  . Cancer Son        colon  . Stroke Mother   . COPD Father     Past Surgical History:  Procedure Laterality Date  . APPENDECTOMY    . Bone Marrow Bx & Asp    . bowel blockage surgery    .  COLONOSCOPY    . right kidney removed  1980  . Rt. Cataract surgery    . TOTAL KNEE ARTHROPLASTY Right 01/18/2017  . TOTAL KNEE ARTHROPLASTY Right 01/18/2017   Procedure: RIGHT TOTAL KNEE ARTHROPLASTY;  Surgeon: Newt Minion, MD;  Location: [facility];  Service: Orthopedics;  Laterality: Right;   Social History   Occupational History  . Not on file.   Social History Main Topics  . Smoking status: Never Smoker  . Smokeless tobacco: Never Used  . Alcohol use No  . Drug use: No  . Sexual activity: No

## 2017-01-26 DIAGNOSIS — Z79891 Long term (current) use of opiate analgesic: Secondary | ICD-10-CM | POA: Diagnosis not present

## 2017-01-26 DIAGNOSIS — M25511 Pain in right shoulder: Secondary | ICD-10-CM | POA: Diagnosis not present

## 2017-01-26 DIAGNOSIS — Z96651 Presence of right artificial knee joint: Secondary | ICD-10-CM | POA: Diagnosis not present

## 2017-01-26 DIAGNOSIS — K219 Gastro-esophageal reflux disease without esophagitis: Secondary | ICD-10-CM | POA: Diagnosis not present

## 2017-01-26 DIAGNOSIS — E039 Hypothyroidism, unspecified: Secondary | ICD-10-CM | POA: Diagnosis not present

## 2017-01-26 DIAGNOSIS — Z471 Aftercare following joint replacement surgery: Secondary | ICD-10-CM | POA: Diagnosis not present

## 2017-01-26 DIAGNOSIS — C9110 Chronic lymphocytic leukemia of B-cell type not having achieved remission: Secondary | ICD-10-CM | POA: Diagnosis not present

## 2017-01-27 DIAGNOSIS — K219 Gastro-esophageal reflux disease without esophagitis: Secondary | ICD-10-CM | POA: Diagnosis not present

## 2017-01-27 DIAGNOSIS — E039 Hypothyroidism, unspecified: Secondary | ICD-10-CM | POA: Diagnosis not present

## 2017-01-27 DIAGNOSIS — C9110 Chronic lymphocytic leukemia of B-cell type not having achieved remission: Secondary | ICD-10-CM | POA: Diagnosis not present

## 2017-01-27 DIAGNOSIS — M25511 Pain in right shoulder: Secondary | ICD-10-CM | POA: Diagnosis not present

## 2017-01-27 DIAGNOSIS — Z96651 Presence of right artificial knee joint: Secondary | ICD-10-CM | POA: Diagnosis not present

## 2017-01-27 DIAGNOSIS — Z79891 Long term (current) use of opiate analgesic: Secondary | ICD-10-CM | POA: Diagnosis not present

## 2017-01-27 DIAGNOSIS — Z471 Aftercare following joint replacement surgery: Secondary | ICD-10-CM | POA: Diagnosis not present

## 2017-01-30 DIAGNOSIS — M25511 Pain in right shoulder: Secondary | ICD-10-CM | POA: Diagnosis not present

## 2017-01-30 DIAGNOSIS — E039 Hypothyroidism, unspecified: Secondary | ICD-10-CM | POA: Diagnosis not present

## 2017-01-30 DIAGNOSIS — C9110 Chronic lymphocytic leukemia of B-cell type not having achieved remission: Secondary | ICD-10-CM | POA: Diagnosis not present

## 2017-01-30 DIAGNOSIS — Z96651 Presence of right artificial knee joint: Secondary | ICD-10-CM | POA: Diagnosis not present

## 2017-01-30 DIAGNOSIS — K219 Gastro-esophageal reflux disease without esophagitis: Secondary | ICD-10-CM | POA: Diagnosis not present

## 2017-01-30 DIAGNOSIS — Z79891 Long term (current) use of opiate analgesic: Secondary | ICD-10-CM | POA: Diagnosis not present

## 2017-01-30 DIAGNOSIS — Z471 Aftercare following joint replacement surgery: Secondary | ICD-10-CM | POA: Diagnosis not present

## 2017-01-31 DIAGNOSIS — K219 Gastro-esophageal reflux disease without esophagitis: Secondary | ICD-10-CM | POA: Diagnosis not present

## 2017-01-31 DIAGNOSIS — Z96651 Presence of right artificial knee joint: Secondary | ICD-10-CM | POA: Diagnosis not present

## 2017-01-31 DIAGNOSIS — M25511 Pain in right shoulder: Secondary | ICD-10-CM | POA: Diagnosis not present

## 2017-01-31 DIAGNOSIS — E039 Hypothyroidism, unspecified: Secondary | ICD-10-CM | POA: Diagnosis not present

## 2017-01-31 DIAGNOSIS — C9110 Chronic lymphocytic leukemia of B-cell type not having achieved remission: Secondary | ICD-10-CM | POA: Diagnosis not present

## 2017-01-31 DIAGNOSIS — Z471 Aftercare following joint replacement surgery: Secondary | ICD-10-CM | POA: Diagnosis not present

## 2017-01-31 DIAGNOSIS — Z79891 Long term (current) use of opiate analgesic: Secondary | ICD-10-CM | POA: Diagnosis not present

## 2017-02-01 ENCOUNTER — Ambulatory Visit (INDEPENDENT_AMBULATORY_CARE_PROVIDER_SITE_OTHER): Payer: Medicare Other | Admitting: Family

## 2017-02-01 ENCOUNTER — Encounter (INDEPENDENT_AMBULATORY_CARE_PROVIDER_SITE_OTHER): Payer: Self-pay | Admitting: Family

## 2017-02-01 DIAGNOSIS — Z96651 Presence of right artificial knee joint: Secondary | ICD-10-CM

## 2017-02-01 MED ORDER — OXYCODONE HCL 5 MG PO TABS: 5.0000 mg | ORAL_TABLET | Freq: Four times a day (QID) | ORAL | 0 refills | Status: DC | PRN

## 2017-02-01 NOTE — Progress Notes (Signed)
Post-Op Visit Note   Patient: Ana Nelson           Date of Birth: Oct 16, 1940           MRN: [id number] Visit Date: 02/01/2017 PCP: Sinda Du, MD  Chief Complaint:  Chief Complaint  Patient presents with  . Right Knee - Pain    HPI:  The patient is a 76 year old woman who is seen 2 weeks status post right total knee arthroplasty. Staples remain in place. She's been working with home health physical therapy. Is pleased with her progress.    Ortho Exam Incision well approximated, healing well. No erythema or drainage. No gaping. Lacks 10 degrees of extension. Flexion to 86 degrees.   Visit Diagnoses:  1. Total knee replacement status, right     Plan: follow up in office in 3 weeks with radiographs. Continue aggressive PT.   Follow-Up Instructions: Return in about 3 weeks (around 02/22/2017).   Imaging: No results found.  Orders:  No orders of the defined types were placed in this encounter.  No orders of the defined types were placed in this encounter.    PMFS History: Patient Active Problem List   Diagnosis Date Noted  . 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 Preoperative clearance 10/27/2016  . Chronic right shoulder pain 07/22/2016  . Unilateral primary osteoarthritis, right knee 06/15/2016  . CML (chronic myelocytic leukemia) ([place]) 05/04/2012   Past Medical History:  Diagnosis Date  . Blindness of right eye   . CML (chronic myelocytic leukemia) ([place])   . CML (chronic myelocytic leukemia) ([place]) 05/04/2012  . Depression   . Diverticulitis   . Drooping eyelid    left eye  . GERD (gastroesophageal reflux disease)   . H/O: CML (chronic myeloid leukemia)   . History of hiatal hernia   . History of migraine headaches    "continuous migraines" per Heme/Onc note 04/2015  . Hypothyroidism   . Injury of left shoulder   . Osteoarthritis   . Osteoarthritis    right knee  . Right knee pain   . Shingles   . Wears glasses     Family  History  Problem Relation Age of Onset  . Cancer Maternal Uncle        bone  . Cancer Paternal Aunt        uterine  . Cancer Maternal Grandmother        breast  . Cancer Maternal Grandfather        throat  . Cancer Paternal Grandmother        leukemia  . Cancer Son        colon  . Stroke Mother   . COPD Father     Past Surgical History:  Procedure Laterality Date  . APPENDECTOMY    . Bone Marrow Bx & Asp    . bowel blockage surgery    . COLONOSCOPY    . right kidney removed  1980  . Rt. Cataract surgery    . TOTAL KNEE ARTHROPLASTY Right 01/18/2017  . TOTAL KNEE ARTHROPLASTY Right 01/18/2017   Procedure: RIGHT TOTAL KNEE ARTHROPLASTY;  Surgeon: Newt Minion, MD;  Location: [facility];  Service: Orthopedics;  Laterality: Right;   Social History   Occupational History  . Not on file.   Social History Main Topics  . Smoking status: Never Smoker  . Smokeless tobacco: Never Used  . Alcohol use No  . Drug use: No  . Sexual activity:  No

## 2017-02-03 DIAGNOSIS — K219 Gastro-esophageal reflux disease without esophagitis: Secondary | ICD-10-CM | POA: Diagnosis not present

## 2017-02-03 DIAGNOSIS — M25511 Pain in right shoulder: Secondary | ICD-10-CM | POA: Diagnosis not present

## 2017-02-03 DIAGNOSIS — E039 Hypothyroidism, unspecified: Secondary | ICD-10-CM | POA: Diagnosis not present

## 2017-02-03 DIAGNOSIS — Z471 Aftercare following joint replacement surgery: Secondary | ICD-10-CM | POA: Diagnosis not present

## 2017-02-03 DIAGNOSIS — Z96651 Presence of right artificial knee joint: Secondary | ICD-10-CM | POA: Diagnosis not present

## 2017-02-03 DIAGNOSIS — Z79891 Long term (current) use of opiate analgesic: Secondary | ICD-10-CM | POA: Diagnosis not present

## 2017-02-03 DIAGNOSIS — C9110 Chronic lymphocytic leukemia of B-cell type not having achieved remission: Secondary | ICD-10-CM | POA: Diagnosis not present

## 2017-02-06 DIAGNOSIS — K219 Gastro-esophageal reflux disease without esophagitis: Secondary | ICD-10-CM | POA: Diagnosis not present

## 2017-02-06 DIAGNOSIS — E039 Hypothyroidism, unspecified: Secondary | ICD-10-CM | POA: Diagnosis not present

## 2017-02-06 DIAGNOSIS — Z96651 Presence of right artificial knee joint: Secondary | ICD-10-CM | POA: Diagnosis not present

## 2017-02-06 DIAGNOSIS — Z471 Aftercare following joint replacement surgery: Secondary | ICD-10-CM | POA: Diagnosis not present

## 2017-02-06 DIAGNOSIS — C9110 Chronic lymphocytic leukemia of B-cell type not having achieved remission: Secondary | ICD-10-CM | POA: Diagnosis not present

## 2017-02-06 DIAGNOSIS — Z79891 Long term (current) use of opiate analgesic: Secondary | ICD-10-CM | POA: Diagnosis not present

## 2017-02-06 DIAGNOSIS — M25511 Pain in right shoulder: Secondary | ICD-10-CM | POA: Diagnosis not present

## 2017-02-07 ENCOUNTER — Telehealth (INDEPENDENT_AMBULATORY_CARE_PROVIDER_SITE_OTHER): Payer: Self-pay | Admitting: Family

## 2017-02-07 NOTE — Telephone Encounter (Signed)
[facility] REQUESTED 3 MORE WKS OF THERAPY WITH PT PLEASE.  [phone]

## 2017-02-08 ENCOUNTER — Telehealth (INDEPENDENT_AMBULATORY_CARE_PROVIDER_SITE_OTHER): Payer: Self-pay | Admitting: Family

## 2017-02-08 DIAGNOSIS — E039 Hypothyroidism, unspecified: Secondary | ICD-10-CM | POA: Diagnosis not present

## 2017-02-08 DIAGNOSIS — Z471 Aftercare following joint replacement surgery: Secondary | ICD-10-CM | POA: Diagnosis not present

## 2017-02-08 DIAGNOSIS — C9110 Chronic lymphocytic leukemia of B-cell type not having achieved remission: Secondary | ICD-10-CM | POA: Diagnosis not present

## 2017-02-08 DIAGNOSIS — M25511 Pain in right shoulder: Secondary | ICD-10-CM | POA: Diagnosis not present

## 2017-02-08 DIAGNOSIS — K219 Gastro-esophageal reflux disease without esophagitis: Secondary | ICD-10-CM | POA: Diagnosis not present

## 2017-02-08 DIAGNOSIS — Z79891 Long term (current) use of opiate analgesic: Secondary | ICD-10-CM | POA: Diagnosis not present

## 2017-02-08 DIAGNOSIS — Z96651 Presence of right artificial knee joint: Secondary | ICD-10-CM | POA: Diagnosis not present

## 2017-02-08 NOTE — Telephone Encounter (Signed)
Amy with [facility] called needing verbal orders to see patient 3 more times. The number to contact Amy is [phone]

## 2017-02-08 NOTE — Telephone Encounter (Signed)
Autumn addressed this already.

## 2017-02-08 NOTE — Telephone Encounter (Signed)
Verbal ok given.

## 2017-02-15 DIAGNOSIS — Z79891 Long term (current) use of opiate analgesic: Secondary | ICD-10-CM | POA: Diagnosis not present

## 2017-02-15 DIAGNOSIS — Z471 Aftercare following joint replacement surgery: Secondary | ICD-10-CM | POA: Diagnosis not present

## 2017-02-15 DIAGNOSIS — M25511 Pain in right shoulder: Secondary | ICD-10-CM | POA: Diagnosis not present

## 2017-02-15 DIAGNOSIS — C9110 Chronic lymphocytic leukemia of B-cell type not having achieved remission: Secondary | ICD-10-CM | POA: Diagnosis not present

## 2017-02-15 DIAGNOSIS — K219 Gastro-esophageal reflux disease without esophagitis: Secondary | ICD-10-CM | POA: Diagnosis not present

## 2017-02-15 DIAGNOSIS — Z96651 Presence of right artificial knee joint: Secondary | ICD-10-CM | POA: Diagnosis not present

## 2017-02-15 DIAGNOSIS — E039 Hypothyroidism, unspecified: Secondary | ICD-10-CM | POA: Diagnosis not present

## 2017-02-16 DIAGNOSIS — C9110 Chronic lymphocytic leukemia of B-cell type not having achieved remission: Secondary | ICD-10-CM | POA: Diagnosis not present

## 2017-02-16 DIAGNOSIS — E039 Hypothyroidism, unspecified: Secondary | ICD-10-CM | POA: Diagnosis not present

## 2017-02-16 DIAGNOSIS — Z79891 Long term (current) use of opiate analgesic: Secondary | ICD-10-CM | POA: Diagnosis not present

## 2017-02-16 DIAGNOSIS — K219 Gastro-esophageal reflux disease without esophagitis: Secondary | ICD-10-CM | POA: Diagnosis not present

## 2017-02-16 DIAGNOSIS — Z96651 Presence of right artificial knee joint: Secondary | ICD-10-CM | POA: Diagnosis not present

## 2017-02-16 DIAGNOSIS — Z471 Aftercare following joint replacement surgery: Secondary | ICD-10-CM | POA: Diagnosis not present

## 2017-02-16 DIAGNOSIS — M25511 Pain in right shoulder: Secondary | ICD-10-CM | POA: Diagnosis not present

## 2017-02-17 DIAGNOSIS — Z471 Aftercare following joint replacement surgery: Secondary | ICD-10-CM | POA: Diagnosis not present

## 2017-02-17 DIAGNOSIS — Z79891 Long term (current) use of opiate analgesic: Secondary | ICD-10-CM | POA: Diagnosis not present

## 2017-02-17 DIAGNOSIS — M25511 Pain in right shoulder: Secondary | ICD-10-CM | POA: Diagnosis not present

## 2017-02-17 DIAGNOSIS — C9110 Chronic lymphocytic leukemia of B-cell type not having achieved remission: Secondary | ICD-10-CM | POA: Diagnosis not present

## 2017-02-17 DIAGNOSIS — K219 Gastro-esophageal reflux disease without esophagitis: Secondary | ICD-10-CM | POA: Diagnosis not present

## 2017-02-17 DIAGNOSIS — Z96651 Presence of right artificial knee joint: Secondary | ICD-10-CM | POA: Diagnosis not present

## 2017-02-17 DIAGNOSIS — E039 Hypothyroidism, unspecified: Secondary | ICD-10-CM | POA: Diagnosis not present

## 2017-02-20 DIAGNOSIS — E039 Hypothyroidism, unspecified: Secondary | ICD-10-CM | POA: Diagnosis not present

## 2017-02-20 DIAGNOSIS — C9110 Chronic lymphocytic leukemia of B-cell type not having achieved remission: Secondary | ICD-10-CM | POA: Diagnosis not present

## 2017-02-20 DIAGNOSIS — M25511 Pain in right shoulder: Secondary | ICD-10-CM | POA: Diagnosis not present

## 2017-02-20 DIAGNOSIS — Z79891 Long term (current) use of opiate analgesic: Secondary | ICD-10-CM | POA: Diagnosis not present

## 2017-02-20 DIAGNOSIS — K219 Gastro-esophageal reflux disease without esophagitis: Secondary | ICD-10-CM | POA: Diagnosis not present

## 2017-02-20 DIAGNOSIS — Z96651 Presence of right artificial knee joint: Secondary | ICD-10-CM | POA: Diagnosis not present

## 2017-02-20 DIAGNOSIS — Z471 Aftercare following joint replacement surgery: Secondary | ICD-10-CM | POA: Diagnosis not present

## 2017-02-22 DIAGNOSIS — K219 Gastro-esophageal reflux disease without esophagitis: Secondary | ICD-10-CM | POA: Diagnosis not present

## 2017-02-22 DIAGNOSIS — Z96651 Presence of right artificial knee joint: Secondary | ICD-10-CM | POA: Diagnosis not present

## 2017-02-22 DIAGNOSIS — Z471 Aftercare following joint replacement surgery: Secondary | ICD-10-CM | POA: Diagnosis not present

## 2017-02-22 DIAGNOSIS — M25511 Pain in right shoulder: Secondary | ICD-10-CM | POA: Diagnosis not present

## 2017-02-22 DIAGNOSIS — Z79891 Long term (current) use of opiate analgesic: Secondary | ICD-10-CM | POA: Diagnosis not present

## 2017-02-22 DIAGNOSIS — C9110 Chronic lymphocytic leukemia of B-cell type not having achieved remission: Secondary | ICD-10-CM | POA: Diagnosis not present

## 2017-02-22 DIAGNOSIS — E039 Hypothyroidism, unspecified: Secondary | ICD-10-CM | POA: Diagnosis not present

## 2017-02-24 DIAGNOSIS — E039 Hypothyroidism, unspecified: Secondary | ICD-10-CM | POA: Diagnosis not present

## 2017-02-24 DIAGNOSIS — Z96651 Presence of right artificial knee joint: Secondary | ICD-10-CM | POA: Diagnosis not present

## 2017-02-24 DIAGNOSIS — K219 Gastro-esophageal reflux disease without esophagitis: Secondary | ICD-10-CM | POA: Diagnosis not present

## 2017-02-24 DIAGNOSIS — Z79891 Long term (current) use of opiate analgesic: Secondary | ICD-10-CM | POA: Diagnosis not present

## 2017-02-24 DIAGNOSIS — Z471 Aftercare following joint replacement surgery: Secondary | ICD-10-CM | POA: Diagnosis not present

## 2017-02-24 DIAGNOSIS — C9110 Chronic lymphocytic leukemia of B-cell type not having achieved remission: Secondary | ICD-10-CM | POA: Diagnosis not present

## 2017-02-24 DIAGNOSIS — M25511 Pain in right shoulder: Secondary | ICD-10-CM | POA: Diagnosis not present

## 2017-02-27 DIAGNOSIS — K219 Gastro-esophageal reflux disease without esophagitis: Secondary | ICD-10-CM | POA: Diagnosis not present

## 2017-02-27 DIAGNOSIS — Z79891 Long term (current) use of opiate analgesic: Secondary | ICD-10-CM | POA: Diagnosis not present

## 2017-02-27 DIAGNOSIS — E039 Hypothyroidism, unspecified: Secondary | ICD-10-CM | POA: Diagnosis not present

## 2017-02-27 DIAGNOSIS — Z96651 Presence of right artificial knee joint: Secondary | ICD-10-CM | POA: Diagnosis not present

## 2017-02-27 DIAGNOSIS — Z471 Aftercare following joint replacement surgery: Secondary | ICD-10-CM | POA: Diagnosis not present

## 2017-02-27 DIAGNOSIS — C9110 Chronic lymphocytic leukemia of B-cell type not having achieved remission: Secondary | ICD-10-CM | POA: Diagnosis not present

## 2017-02-27 DIAGNOSIS — M25511 Pain in right shoulder: Secondary | ICD-10-CM | POA: Diagnosis not present

## 2017-03-01 ENCOUNTER — Ambulatory Visit (INDEPENDENT_AMBULATORY_CARE_PROVIDER_SITE_OTHER): Payer: Medicare Other | Admitting: Family

## 2017-03-01 ENCOUNTER — Encounter (INDEPENDENT_AMBULATORY_CARE_PROVIDER_SITE_OTHER): Payer: Self-pay | Admitting: Family

## 2017-03-01 ENCOUNTER — Ambulatory Visit (INDEPENDENT_AMBULATORY_CARE_PROVIDER_SITE_OTHER): Payer: Medicare Other

## 2017-03-01 VITALS — Ht 61.0 in | Wt 116.0 lb

## 2017-03-01 DIAGNOSIS — G8929 Other chronic pain: Secondary | ICD-10-CM | POA: Diagnosis not present

## 2017-03-01 DIAGNOSIS — M25561 Pain in right knee: Secondary | ICD-10-CM | POA: Diagnosis not present

## 2017-03-01 DIAGNOSIS — Z96651 Presence of right artificial knee joint: Secondary | ICD-10-CM

## 2017-03-01 MED ORDER — OXYCODONE HCL 5 MG PO TABS: 5.0000 mg | ORAL_TABLET | Freq: Two times a day (BID) | ORAL | 0 refills | Status: DC | PRN

## 2017-03-01 NOTE — Progress Notes (Signed)
Post-Op Visit Note   Patient: Ana Nelson           Date of Birth: 03/16/1941           MRN: [id number] Visit Date: 03/01/2017 PCP: Sinda Du, MD  Chief Complaint:  Chief Complaint  Patient presents with  . Right Knee - Routine Post Op    01/18/17 right total knee replacement     HPI:  The patient is a 76 year old woman who presents today about 6 weeks status post right total knee arthroplasty she's been full weightbearing without any status devices [facility] has been continuing with physical therapy she states she has one more visit left.    Ortho Exam On examination the incision is well-healed there is no erythema and minimal swelling. She has active him extension with him lacking about 10 of range of motion active flexion 200. Passively,  flexion to 105 and lacks about 5 of full extension.  Visit Diagnoses:  1. Chronic pain of right knee   2. Total knee replacement status, right     Plan: Complete physical therapy encouraged her to continue with home exercises. She will follow-up in office as needed. Given a last prescription for Oxycodone.  Follow-Up Instructions: Return if symptoms worsen or fail to improve.   Imaging: Xr Knee 1-2 Views Right  Result Date: 03/01/2017 Radiographs of the right knee show stable alignment of prosthesis. No complicating features.    Orders:  Orders Placed This Encounter  Procedures  . XR Knee 1-2 Views Right   Meds ordered this encounter  Medications  . oxyCODONE (ROXICODONE) 5 MG immediate release tablet    Sig: Take 1 tablet (5 mg total) by mouth 2 (two) times daily as needed for severe pain.    Dispense:  60 tablet    Refill:  0     PMFS History: Patient Active Problem List   Diagnosis Date Noted  . 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 Preoperative clearance 10/27/2016  . Chronic right shoulder pain 07/22/2016  . Unilateral primary osteoarthritis, right knee 06/15/2016  . 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 Blindness of right eye   . CML (chronic myelocytic leukemia) ([place])   . CML (chronic myelocytic leukemia) ([place]) 05/04/2012  . Depression   . Diverticulitis   . Drooping eyelid    left eye  . GERD (gastroesophageal reflux disease)   . H/O: CML (chronic myeloid leukemia)   . History of hiatal hernia   . History of migraine headaches    "continuous migraines" per Heme/Onc note 04/2015  . Hypothyroidism   . Injury of left shoulder   . Osteoarthritis   . Osteoarthritis    right knee  . Right knee pain   . Shingles   . Wears glasses     Family History  Problem Relation Age of Onset  . Cancer Maternal Uncle        bone  . Cancer Paternal Aunt        uterine  . Cancer Maternal Grandmother        breast  . Cancer Maternal Grandfather        throat  . Cancer Paternal Grandmother        leukemia  . Cancer Son        colon  . Stroke Mother   . COPD Father     Past Surgical History:  Procedure Laterality Date  .  APPENDECTOMY    . Bone Marrow Bx & Asp    . bowel blockage surgery    . COLONOSCOPY    . right kidney removed  1980  . Rt. Cataract surgery    . TOTAL KNEE ARTHROPLASTY Right 01/18/2017  . TOTAL KNEE ARTHROPLASTY Right 01/18/2017   Procedure: RIGHT TOTAL KNEE ARTHROPLASTY;  Surgeon: Newt Minion, MD;  Location: [facility];  Service: Orthopedics;  Laterality: Right;   Social History   Occupational History  . Not on file.   Social History Main Topics  . Smoking status: Never Smoker  . Smokeless tobacco: Never Used  . Alcohol use No  . Drug use: No  . Sexual activity: No

## 2017-03-02 DIAGNOSIS — Z471 Aftercare following joint replacement surgery: Secondary | ICD-10-CM | POA: Diagnosis not present

## 2017-03-02 DIAGNOSIS — K219 Gastro-esophageal reflux disease without esophagitis: Secondary | ICD-10-CM | POA: Diagnosis not present

## 2017-03-02 DIAGNOSIS — M25511 Pain in right shoulder: Secondary | ICD-10-CM | POA: Diagnosis not present

## 2017-03-02 DIAGNOSIS — E039 Hypothyroidism, unspecified: Secondary | ICD-10-CM | POA: Diagnosis not present

## 2017-03-02 DIAGNOSIS — C9110 Chronic lymphocytic leukemia of B-cell type not having achieved remission: Secondary | ICD-10-CM | POA: Diagnosis not present

## 2017-03-02 DIAGNOSIS — Z79891 Long term (current) use of opiate analgesic: Secondary | ICD-10-CM | POA: Diagnosis not present

## 2017-03-02 DIAGNOSIS — Z96651 Presence of right artificial knee joint: Secondary | ICD-10-CM | POA: Diagnosis not present

## 2017-03-07 DIAGNOSIS — Z471 Aftercare following joint replacement surgery: Secondary | ICD-10-CM | POA: Diagnosis not present

## 2017-03-07 DIAGNOSIS — E039 Hypothyroidism, unspecified: Secondary | ICD-10-CM | POA: Diagnosis not present

## 2017-03-07 DIAGNOSIS — K219 Gastro-esophageal reflux disease without esophagitis: Secondary | ICD-10-CM | POA: Diagnosis not present

## 2017-03-07 DIAGNOSIS — C9110 Chronic lymphocytic leukemia of B-cell type not having achieved remission: Secondary | ICD-10-CM | POA: Diagnosis not present

## 2017-03-07 DIAGNOSIS — M25511 Pain in right shoulder: Secondary | ICD-10-CM | POA: Diagnosis not present

## 2017-03-07 DIAGNOSIS — Z96651 Presence of right artificial knee joint: Secondary | ICD-10-CM | POA: Diagnosis not present

## 2017-03-07 DIAGNOSIS — Z79891 Long term (current) use of opiate analgesic: Secondary | ICD-10-CM | POA: Diagnosis not present

## 2017-03-09 DIAGNOSIS — K219 Gastro-esophageal reflux disease without esophagitis: Secondary | ICD-10-CM | POA: Diagnosis not present

## 2017-03-09 DIAGNOSIS — C9110 Chronic lymphocytic leukemia of B-cell type not having achieved remission: Secondary | ICD-10-CM | POA: Diagnosis not present

## 2017-03-09 DIAGNOSIS — Z471 Aftercare following joint replacement surgery: Secondary | ICD-10-CM | POA: Diagnosis not present

## 2017-03-09 DIAGNOSIS — Z96651 Presence of right artificial knee joint: Secondary | ICD-10-CM | POA: Diagnosis not present

## 2017-03-09 DIAGNOSIS — Z79891 Long term (current) use of opiate analgesic: Secondary | ICD-10-CM | POA: Diagnosis not present

## 2017-03-09 DIAGNOSIS — E039 Hypothyroidism, unspecified: Secondary | ICD-10-CM | POA: Diagnosis not present

## 2017-03-09 DIAGNOSIS — M25511 Pain in right shoulder: Secondary | ICD-10-CM | POA: Diagnosis not present

## 2017-03-17 DIAGNOSIS — E039 Hypothyroidism, unspecified: Secondary | ICD-10-CM | POA: Diagnosis not present

## 2017-03-17 DIAGNOSIS — Z79891 Long term (current) use of opiate analgesic: Secondary | ICD-10-CM | POA: Diagnosis not present

## 2017-03-17 DIAGNOSIS — Z96651 Presence of right artificial knee joint: Secondary | ICD-10-CM | POA: Diagnosis not present

## 2017-03-17 DIAGNOSIS — Z471 Aftercare following joint replacement surgery: Secondary | ICD-10-CM | POA: Diagnosis not present

## 2017-03-17 DIAGNOSIS — M25511 Pain in right shoulder: Secondary | ICD-10-CM | POA: Diagnosis not present

## 2017-03-17 DIAGNOSIS — C9110 Chronic lymphocytic leukemia of B-cell type not having achieved remission: Secondary | ICD-10-CM | POA: Diagnosis not present

## 2017-03-17 DIAGNOSIS — K219 Gastro-esophageal reflux disease without esophagitis: Secondary | ICD-10-CM | POA: Diagnosis not present

## 2017-03-30 DIAGNOSIS — D509 Iron deficiency anemia, unspecified: Secondary | ICD-10-CM | POA: Diagnosis not present

## 2017-03-30 DIAGNOSIS — R809 Proteinuria, unspecified: Secondary | ICD-10-CM | POA: Diagnosis not present

## 2017-03-30 DIAGNOSIS — E559 Vitamin D deficiency, unspecified: Secondary | ICD-10-CM | POA: Diagnosis not present

## 2017-03-30 DIAGNOSIS — N183 Chronic kidney disease, stage 3 (moderate): Secondary | ICD-10-CM | POA: Diagnosis not present

## 2017-03-30 DIAGNOSIS — I10 Essential (primary) hypertension: Secondary | ICD-10-CM | POA: Diagnosis not present

## 2017-03-30 DIAGNOSIS — Z79899 Other long term (current) drug therapy: Secondary | ICD-10-CM | POA: Diagnosis not present

## 2017-04-04 DIAGNOSIS — I10 Essential (primary) hypertension: Secondary | ICD-10-CM | POA: Diagnosis not present

## 2017-04-04 DIAGNOSIS — E876 Hypokalemia: Secondary | ICD-10-CM | POA: Diagnosis not present

## 2017-04-04 DIAGNOSIS — N183 Chronic kidney disease, stage 3 (moderate): Secondary | ICD-10-CM | POA: Diagnosis not present

## 2017-04-04 DIAGNOSIS — E870 Hyperosmolality and hypernatremia: Secondary | ICD-10-CM | POA: Diagnosis not present

## 2017-04-11 DIAGNOSIS — H524 Presbyopia: Secondary | ICD-10-CM | POA: Diagnosis not present

## 2017-04-20 DIAGNOSIS — C9211 Chronic myeloid leukemia, BCR/ABL-positive, in remission: Secondary | ICD-10-CM | POA: Diagnosis not present

## 2017-04-20 DIAGNOSIS — E0520 Thyrotoxicosis with toxic multinodular goiter without thyrotoxic crisis or storm: Secondary | ICD-10-CM | POA: Diagnosis not present

## 2017-04-20 DIAGNOSIS — E785 Hyperlipidemia, unspecified: Secondary | ICD-10-CM | POA: Diagnosis not present

## 2017-04-30 ENCOUNTER — Other Ambulatory Visit (HOSPITAL_COMMUNITY): Payer: Self-pay | Admitting: Oncology

## 2017-04-30 DIAGNOSIS — C9210 Chronic myeloid leukemia, BCR/ABL-positive, not having achieved remission: Secondary | ICD-10-CM

## 2017-05-01 ENCOUNTER — Other Ambulatory Visit (HOSPITAL_COMMUNITY): Payer: Self-pay | Admitting: Emergency Medicine

## 2017-05-01 DIAGNOSIS — C9210 Chronic myeloid leukemia, BCR/ABL-positive, not having achieved remission: Secondary | ICD-10-CM

## 2017-05-01 NOTE — Progress Notes (Signed)
Refill for request for Gleevec.  Pt has not been seen since 2017.  Called pt to let her know she will need to be seen by the doctor before we can refill her [place].  Pt is coming in for labs and to see the doctor on 10/31.  Labs at 2:20 pm and doctor at 3:00 pm.  Pt verbalized understanding.

## 2017-05-03 ENCOUNTER — Encounter (HOSPITAL_COMMUNITY): Payer: Medicare Other | Attending: Adult Health

## 2017-05-03 DIAGNOSIS — C9210 Chronic myeloid leukemia, BCR/ABL-positive, not having achieved remission: Secondary | ICD-10-CM

## 2017-05-03 LAB — COMPREHENSIVE METABOLIC PANEL
ALBUMIN: 3.9 g/dL (ref 3.5–5.0)
ALT: 17 U/L (ref 14–54)
AST: 41 U/L (ref 15–41)
Alkaline Phosphatase: 79 U/L (ref 38–126)
Anion gap: 8 (ref 5–15)
BUN: 21 mg/dL — ABNORMAL HIGH (ref 6–20)
CHLORIDE: 105 mmol/L (ref 101–111)
CO2: 28 mmol/L (ref 22–32)
Calcium: 9.1 mg/dL (ref 8.9–10.3)
Creatinine, Ser: 1.83 mg/dL — ABNORMAL HIGH (ref 0.44–1.00)
GFR calc Af Amer: 30 mL/min — ABNORMAL LOW (ref >60)
GFR, EST NON AFRICAN AMERICAN: 26 mL/min — AB (ref >60)
Glucose, Bld: 88 mg/dL (ref 65–99)
POTASSIUM: 4.1 mmol/L (ref 3.5–5.1)
SODIUM: 141 mmol/L (ref 135–145)
Total Bilirubin: 0.6 mg/dL (ref 0.3–1.2)
Total Protein: 6.2 g/dL — ABNORMAL LOW (ref 6.5–8.1)

## 2017-05-03 LAB — CBC WITH DIFFERENTIAL/PLATELET
BASOS ABS: 0 10*3/uL (ref 0.0–0.1)
BASOS PCT: 1 %
EOS PCT: 2 %
Eosinophils Absolute: 0.1 10*3/uL (ref 0.0–0.7)
HCT: 34.1 % — ABNORMAL LOW (ref 36.0–46.0)
Hemoglobin: 11.1 g/dL — ABNORMAL LOW (ref 12.0–15.0)
LYMPHS PCT: 20 %
Lymphs Abs: 0.9 10*3/uL (ref 0.7–4.0)
MCH: 33.7 pg (ref 26.0–34.0)
MCHC: 32.6 g/dL (ref 30.0–36.0)
MCV: 103.6 fL — ABNORMAL HIGH (ref 78.0–100.0)
MONO ABS: 0.4 10*3/uL (ref 0.1–1.0)
Monocytes Relative: 10 %
Neutro Abs: 2.8 10*3/uL (ref 1.7–7.7)
Neutrophils Relative %: 67 %
PLATELETS: 100 10*3/uL — AB (ref 150–400)
RBC: 3.29 MIL/uL — AB (ref 3.87–5.11)
RDW: 15 % (ref 11.5–15.5)
WBC: 4.3 10*3/uL (ref 4.0–10.5)

## 2017-05-04 LAB — FERRITIN: Ferritin: 137 ng/mL (ref 11–307)

## 2017-05-04 LAB — IRON AND TIBC
Iron: 84 ug/dL (ref 28–170)
SATURATION RATIOS: 33 % — AB (ref 10.4–31.8)
TIBC: 253 ug/dL (ref 250–450)
UIBC: 169 ug/dL

## 2017-05-04 LAB — FOLATE: FOLATE: 17.4 ng/mL (ref >5.9)

## 2017-05-04 LAB — VITAMIN B12: Vitamin B-12: 232 pg/mL (ref 180–914)

## 2017-05-08 DIAGNOSIS — C9211 Chronic myeloid leukemia, BCR/ABL-positive, in remission: Secondary | ICD-10-CM | POA: Diagnosis not present

## 2017-05-08 DIAGNOSIS — E890 Postprocedural hypothyroidism: Secondary | ICD-10-CM | POA: Diagnosis not present

## 2017-05-08 DIAGNOSIS — D34 Benign neoplasm of thyroid gland: Secondary | ICD-10-CM | POA: Diagnosis not present

## 2017-05-08 DIAGNOSIS — E0520 Thyrotoxicosis with toxic multinodular goiter without thyrotoxic crisis or storm: Secondary | ICD-10-CM | POA: Diagnosis not present

## 2017-05-08 DIAGNOSIS — I480 Paroxysmal atrial fibrillation: Secondary | ICD-10-CM | POA: Diagnosis not present

## 2017-05-10 ENCOUNTER — Other Ambulatory Visit (HOSPITAL_COMMUNITY): Payer: Medicare Other

## 2017-05-10 ENCOUNTER — Encounter (HOSPITAL_COMMUNITY): Payer: Self-pay | Admitting: Adult Health

## 2017-05-10 ENCOUNTER — Encounter (HOSPITAL_BASED_OUTPATIENT_CLINIC_OR_DEPARTMENT_OTHER): Payer: Medicare Other | Admitting: Adult Health

## 2017-05-10 VITALS — BP 123/55 | HR 57 | Temp 98.8°F | Resp 16 | Wt 117.1 lb

## 2017-05-10 DIAGNOSIS — E538 Deficiency of other specified B group vitamins: Secondary | ICD-10-CM

## 2017-05-10 DIAGNOSIS — R5383 Other fatigue: Secondary | ICD-10-CM | POA: Diagnosis not present

## 2017-05-10 DIAGNOSIS — C9210 Chronic myeloid leukemia, BCR/ABL-positive, not having achieved remission: Secondary | ICD-10-CM

## 2017-05-10 DIAGNOSIS — C9211 Chronic myeloid leukemia, BCR/ABL-positive, in remission: Secondary | ICD-10-CM | POA: Diagnosis not present

## 2017-05-10 DIAGNOSIS — N189 Chronic kidney disease, unspecified: Secondary | ICD-10-CM

## 2017-05-10 DIAGNOSIS — D649 Anemia, unspecified: Secondary | ICD-10-CM | POA: Diagnosis not present

## 2017-05-10 DIAGNOSIS — D696 Thrombocytopenia, unspecified: Secondary | ICD-10-CM | POA: Diagnosis not present

## 2017-05-10 LAB — BCR-ABL1, CML/ALL, PCR, QUANT

## 2017-05-10 NOTE — Patient Instructions (Addendum)
[place] at [facility] Discharge Instructions  RECOMMENDATIONS MADE BY THE CONSULTANT AND ANY TEST RESULTS WILL BE SENT TO YOUR REFERRING PHYSICIAN.  You were seen today by Mike Craze, NP Follow up in 6 months  See schedulers up front for appointments   Thank you for choosing [place] at [facility] to provide your oncology and hematology care.  To afford each patient quality time with our provider, please arrive at least 15 minutes before your scheduled appointment time.    If you have a lab appointment with the [facility] please come in thru the  Main Entrance and check in at the main information desk  You need to re-schedule your appointment should you arrive 10 or more minutes late.  We strive to give you quality time with our providers, and arriving late affects you and other patients whose appointments are after yours.  Also, if you no show three or more times for appointments you may be dismissed from the clinic at the providers discretion.     Again, thank you for choosing [facility].  Our hope is that these requests will decrease the amount of time that you wait before being seen by our physicians.       _____________________________________________________________  Should you have questions after your visit to [facility], please contact our office at [phone] between the hours of 8:30 a.m. and 4:30 p.m.  Voicemails left after 4:30 p.m. will not be returned until the following business day.  For prescription refill requests, have your pharmacy contact our office.       Resources For Cancer Patients and their Caregivers ? American Cancer Society: Can assist with transportation, wigs, general needs, runs Look Good Feel Better.        ([phone] ? Cancer Care: Provides financial assistance, online support groups, medication/co-pay assistance.  1-800-813-HOPE (727) 135-0144) ? [place] Assists [place] cancer patients and their families through emotional , educational and financial support.  (819) 690-8494 ? [place] DSS Where to apply for food stamps, Medicaid and utility assistance. (909)401-2719 ? [facility]: Transportation to medical appointments. 743-498-7160 ? Social Security Administration: May apply for disability if have a Stage IV cancer. (785) 277-5815 609-539-6365 ? [employer], Disability and Transit Services: Assists with nutrition, care and transit needs. [place] Support Programs: @10RELATIVEDAYS @ > Cancer Support Group  2nd Tuesday of the month 1pm-2pm, Journey Room  > Creative Journey  3rd Tuesday of the month 1130am-1pm, Journey Room  > Look Good Feel Better  1st Wednesday of the month 10am-12 noon, Journey Room (Call [place] to register [phone])

## 2017-05-10 NOTE — Progress Notes (Signed)
[address] [place], [place] [address]   CLINIC:  Medical Oncology/Hematology  PCP:  Sinda Du, MD [place] [id number] [phone]   REASON FOR VISIT:  Follow-up for CML  CURRENT THERAPY: Gleevec 400 mg daily     HISTORY OF PRESENT ILLNESS:  (From Kirby Crigler, PA-C's last note on 12/04/15)       INTERVAL HISTORY:  Ms. Ozdemir 76 y.o. female returns for follow-up for CML.   It has been quite some time since we have seen her; last visit at cancer center was in 11/2015.  Here today with her husband of 60+ years.    Overall, she tells me she has been doing well.  Appetite 50%; reports that her weight has largely been stable.  Her biggest concern is "no energy at all."  Endorses trouble swallowing; she recently saw her endocrinologist, Dr. Francoise Schaumann, who has adjusted her thyroid medications and also started her on oral vitamin U98 and folic acid supplements.  She has only started these recently.  Her endocrinologist thinks her swallowing symptoms may be secondary to her thyroid and thus medication adjustments were made.  Her PCP is Dr. [name]; she sees him periodically as well.  She has already had her flu vaccine for this season per her report.   Remains on Gleevec with good tolerance.  Denies any missed doses or trouble obtaining the medication.  Denies any frank bleeding episodes including blood in her stools, dark/tarry stools, hematuria, vaginal bleeding, nosebleeds, or gingival bleeding.  She bruises easily, but denies any abnormal bruising.  Denies fever/chills.   Endorses 2 recent falls where she has lost her balance. Denies any trauma or injury; denies hitting her head.    For fun, she loves going to concerts.  She also loves Elvis and has many Elvis collectibles.     REVIEW OF SYSTEMS:  Review of Systems - Oncology , per HPI    PAST MEDICAL/SURGICAL HISTORY:  Past Medical History:  Diagnosis Date    . Blindness of right eye   . CML (chronic myelocytic leukemia) ([facility])   . CML (chronic myelocytic leukemia) ([facility]) 05/04/2012  . Depression   . Diverticulitis   . Drooping eyelid    left eye  . GERD (gastroesophageal reflux disease)   . H/O: CML (chronic myeloid leukemia)   . History of hiatal hernia   . History of migraine headaches    "continuous migraines" per Heme/Onc note 04/2015  . Hypothyroidism   . Injury of left shoulder   . Osteoarthritis   . Osteoarthritis    right knee  . Right knee pain   . Shingles   . Wears glasses    Past Surgical History:  Procedure Laterality Date  . APPENDECTOMY    . Bone Marrow Bx & Asp    . bowel blockage surgery    . COLONOSCOPY    . right kidney removed  1980  . Rt. Cataract surgery    . TOTAL KNEE ARTHROPLASTY Right 01/18/2017  . TOTAL KNEE ARTHROPLASTY Right 01/18/2017   Procedure: RIGHT TOTAL KNEE ARTHROPLASTY;  Surgeon: Newt Minion, MD;  Location: [facility];  Service: Orthopedics;  Laterality: Right;     SOCIAL HISTORY:  Social History   Social History  . Marital status: Married    Spouse name: N/A  . Number of children: N/A  . Years of education: N/A   Occupational History  . Not on file.   Social  History Main Topics  . Smoking status: Never Smoker  . Smokeless tobacco: Never Used  . Alcohol use No  . Drug use: No  . Sexual activity: No   Other Topics Concern  . Not on file   Social History Narrative  . No narrative on file    FAMILY HISTORY:  Family History  Problem Relation Age of Onset  . Cancer Maternal Uncle        bone  . Cancer Paternal Aunt        uterine  . Cancer Maternal Grandmother        breast  . Cancer Maternal Grandfather        throat  . Cancer Paternal Grandmother        leukemia  . Cancer Son        colon  . Stroke Mother   . COPD Father     CURRENT MEDICATIONS:  Outpatient Encounter Prescriptions as of 05/10/2017  Medication Sig  . ALPRAZolam (XANAX) 1 MG tablet Take 1 mg  by mouth 2 (two) times daily as needed for anxiety or sleep.   . butorphanol (STADOL) 10 MG/ML nasal spray Place 1 spray into the nose every 4 (four) hours as needed for migraine (migraines).   . imatinib (GLEEVEC) 400 MG tablet TAKE 1 TABLET BY MOUTH DAILY WITH FOOD AND A LARGE GLASS OF WATER *ALWAYS USE SECONDARY INS XL HEALTH ON THIS MEDICATION* (Patient taking differently: Take 400 mg by mouth daily. TAKE 1 TABLET BY MOUTH DAILY WITH FOOD AND A LARGE GLASS OF WATER *ALWAYS USE SECONDARY INS XL HEALTH ON THIS MEDICATION*)  . lidocaine (LIDODERM) 5 % Place 1 patch onto the skin daily as needed (pain). Remove & Discard patch within 12 hours or as directed by MD  . loperamide (IMODIUM) 2 MG capsule Take 2 mg by mouth as needed for diarrhea or loose stools.  Marland Kitchen oxyCODONE (ROXICODONE) 5 MG immediate release tablet Take 1 tablet (5 mg total) by mouth 2 (two) times daily as needed for severe pain.  . promethazine (PHENERGAN) 25 MG tablet TAKE 1/2 TABLET BY MOUTH FOUR TIMES DAILY AS NEEDED  . sertraline (ZOLOFT) 50 MG tablet Take 50 mg by mouth daily.  Marland Kitchen SYNTHROID 100 MCG tablet Take 100 mcg by mouth daily.  . [DISCONTINUED] aspirin EC 325 MG EC tablet Take 1 tablet (325 mg total) by mouth daily with breakfast.  . [DISCONTINUED] levothyroxine (SYNTHROID, LEVOTHROID) 88 MCG tablet Take 88 mcg by mouth daily before breakfast.   No facility-administered encounter medications on file as of 05/10/2017.     ALLERGIES:  Allergies  Allergen Reactions  . Nubain [Nalbuphine Hcl] Swelling    Tongue swells  . Perphenazine     seizures  . Vistaril [Hydroxyzine Hcl] Swelling and Other (See Comments)    TONGUE SWELLS     PHYSICAL EXAM:  ECOG Performance status: 1 - Symptomatic; remains independent   Vitals:   05/10/17 1533  BP: (!) 123/55  Pulse: (!) 57  Resp: 16  Temp: 98.8 F (37.1 C)  SpO2: 100%   Filed Weights   05/10/17 1533  Weight: 117 lb 1.6 oz (53.1 kg)    Physical Exam    Constitutional: She is oriented to person, place, and time.  Thin elderly female in no acute distress   HENT:  Head: Normocephalic.  Mouth/Throat: Oropharynx is clear and moist. No oropharyngeal exudate.  Eyes: Pupils are equal, round, and reactive to light. Conjunctivae are normal. No scleral icterus.  Neck: Normal range of motion. Neck supple.  Cardiovascular: Regular rhythm.   Bradycardic  Pulmonary/Chest: Effort normal and breath sounds normal. No respiratory distress. She has no wheezes.  Abdominal: Soft. Bowel sounds are normal. There is no tenderness.  Musculoskeletal: Normal range of motion. She exhibits no edema.  Lymphadenopathy:    She has no cervical adenopathy.  Neurological: She is alert and oriented to person, place, and time. Gait normal.  Skin: Skin is warm and dry. No rash noted. There is pallor.  Psychiatric: Mood, memory, affect and judgment normal.  Nursing note and vitals reviewed.    LABORATORY DATA:  I have reviewed the labs as listed.  CBC    Component Value Date/Time   WBC 4.3 05/03/2017 1253   RBC 3.29 (L) 05/03/2017 1253   HGB 11.1 (L) 05/03/2017 1253   HCT 34.1 (L) 05/03/2017 1253   PLT 100 (L) 05/03/2017 1253   MCV 103.6 (H) 05/03/2017 1253   MCH 33.7 05/03/2017 1253   MCHC 32.6 05/03/2017 1253   RDW 15.0 05/03/2017 1253   LYMPHSABS 0.9 05/03/2017 1253   MONOABS 0.4 05/03/2017 1253   EOSABS 0.1 05/03/2017 1253   BASOSABS 0.0 05/03/2017 1253   CMP Latest Ref Rng & Units 05/03/2017 01/05/2017 06/27/2016  Glucose 65 - 99 mg/dL 88 92 -  BUN 6 - 20 mg/dL 21(H) 18 -  Creatinine 0.44 - 1.00 mg/dL 1.83(H) 1.61(H) 1.50(H)  Sodium 135 - 145 mmol/L 141 139 -  Potassium 3.5 - 5.1 mmol/L 4.1 4.3 -  Chloride 101 - 111 mmol/L 105 106 -  CO2 22 - 32 mmol/L 28 26 -  Calcium 8.9 - 10.3 mg/dL 9.1 9.2 -  Total Protein 6.5 - 8.1 g/dL 6.2(L) - -  Total Bilirubin 0.3 - 1.2 mg/dL 0.6 - -  Alkaline Phos 38 - 126 U/L 79 - -  AST 15 - 41 U/L 41 - -  ALT 14 -  54 U/L 17 - -   Results for MEKIYAH, GLADWELL (MRN [id number])   Ref. Range 05/03/2017 12:53  Iron Latest Ref Range: 28 - 170 ug/dL 84  UIBC Latest Units: ug/dL 169  TIBC Latest Ref Range: 250 - 450 ug/dL 253  Saturation Ratios Latest Ref Range: 10.4 - 31.8 % 33 (H)  Ferritin Latest Ref Range: 11 - 307 ng/mL 137  Folate Latest Ref Range: >5.9 ng/mL 17.4  Vitamin B12 Latest Ref Range: 180 - 914 pg/mL 232     PENDING LABS:    DIAGNOSTIC IMAGING:    PATHOLOGY:       ASSESSMENT & PLAN:   CML:  -Initially diagnosed in 2000; has remained on [place] with complete molecular response since that time. -Remains in molecular remission; most recent BCR-ABL from 05/03/17 negative.   -Return to cancer center in 6 months for follow-up with labs 1 week prior; orders placed today. She is agreeable to this plan.   Anemia and CKD:  -Hgb mildly low at 11.1 g/dL today. Denies any bleeding episodes.   -Iron studies were normal recently.  Agree with putting her on oral vitamin B12 supplementation, as her vitamin B12 level is on the low end of normal in the 200s.  B12 may also help slightly with erythropoiesis.  -Reportedly managed by her nephrologist and monitored by her PCP as well.  Offered labs in 3 months here at cancer center for continued monitoring, but states that her other specialists are planning to order labs around that time and she will have them follow-up  on these things. This is not unreasonable.  Of course, if nephrology or PCP requests our input, we are happy to be of assistance.  We recommend continued monitoring of her kidney function, as Gleevec has associated risk of kidney function decline with long-term use based on safety report from [facility] safety review from 09/2014.  -Will recheck her anemia panel in 6 months prior to her next follow-up visit here to ensure stability.   Thrombocytopenia and Falls precautions:  -Encouraged her to use caution when ambulating,  particularly given her chronically mildly low platelets (most recently 100,000 on 05/03/17).  -Recommended that if she ever falls and hits her head, that she should seek emergency medical evaluation given her increased risk of subdural hematoma with thrombocytopenia.   Fatigue:  -Likely multifactorial given chemotherapy, anemia, and her nutrition.  -Encouraged her to increase her physical activity/exercise as tolerated. Also encouraged her to increase her consumption of high-calorie/high-protein foods to potentially help provide her more energy.       Dispo:  -Return to cancer center in 6 months for follow-up with labs 1 week prior.    All questions were answered to patient's stated satisfaction. Encouraged patient to call with any new concerns or questions before her next visit to the cancer center and we can certain see her sooner, if needed.    Plan of care discussed with Dr. Talbert Cage, who agrees with the above aforementioned.     Orders placed this encounter:  Orders Placed This Encounter  Procedures  . BCR-ABL1, CML/ALL, PCR, QUANT  . CBC with Differential/Platelet  . Comprehensive metabolic panel  . Vitamin B12  . Folate  . Iron and TIBC  . Ferritin      Mike Craze, NP [place] [phone]

## 2017-05-17 ENCOUNTER — Other Ambulatory Visit (HOSPITAL_COMMUNITY): Payer: Self-pay | Admitting: Pulmonary Disease

## 2017-05-17 ENCOUNTER — Ambulatory Visit (HOSPITAL_COMMUNITY)
Admission: RE | Admit: 2017-05-17 | Discharge: 2017-05-17 | Disposition: A | Discharge status: Home / Self Care | Payer: Medicare Other | Source: Ambulatory Visit | Attending: Pulmonary Disease | Admitting: Pulmonary Disease

## 2017-05-17 DIAGNOSIS — R52 Pain, unspecified: Secondary | ICD-10-CM | POA: Diagnosis not present

## 2017-05-17 DIAGNOSIS — Z471 Aftercare following joint replacement surgery: Secondary | ICD-10-CM | POA: Diagnosis not present

## 2017-05-17 DIAGNOSIS — W19XXXA Unspecified fall, initial encounter: Secondary | ICD-10-CM | POA: Insufficient documentation

## 2017-05-17 DIAGNOSIS — Z96652 Presence of left artificial knee joint: Secondary | ICD-10-CM | POA: Diagnosis not present

## 2017-05-17 DIAGNOSIS — M25461 Effusion, right knee: Secondary | ICD-10-CM | POA: Diagnosis not present

## 2017-05-24 IMAGING — CT CT ABD-PELV W/ CM
2 of 5 series · 16 of 46 positions shown, 18 images · IV contrast (Isovue)
Comparison: CT scan of August 28, 2015.

CLINICAL DATA: Generalized abdominal pain for 2 weeks.

EXAM:
CT ABDOMEN AND PELVIS WITH CONTRAST
TECHNIQUE: Multidetector CT imaging of the abdomen and pelvis was performed
using the standard protocol following bolus administration of
intravenous contrast.
CONTRAST:  80mL FLW8RY-NKK IOPAMIDOL (FLW8RY-NKK) INJECTION 61%

[Series 2: axial st · axial · 0.65mm/px · z∈[+960,+1320]mm · 13 of 82 slices shown, 15 images]
[im 5/82  soft-tissue]
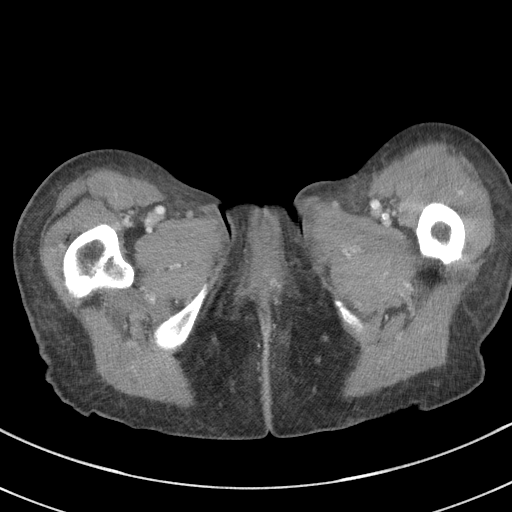
[im 5/82  bone]
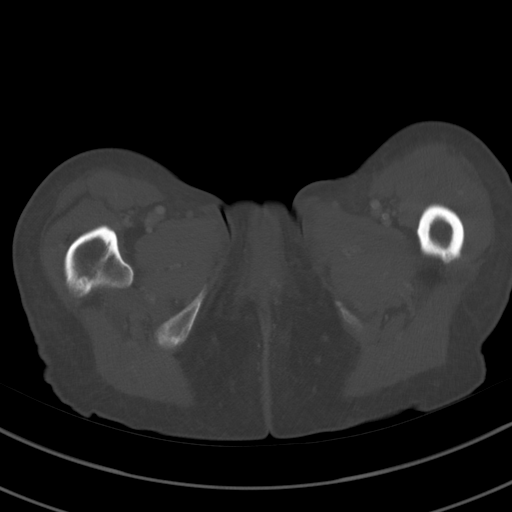
[im 13/82  soft-tissue]
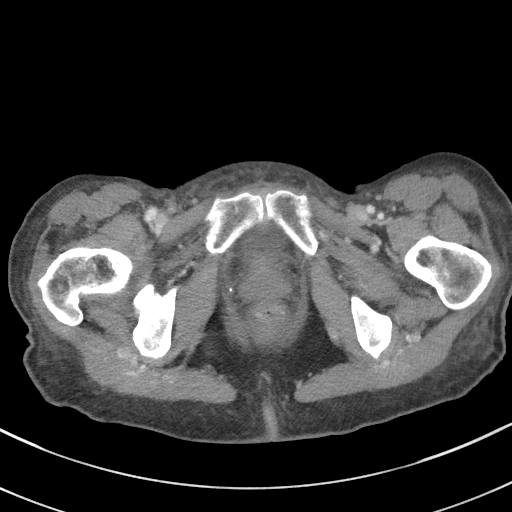
[im 18/82  soft-tissue]
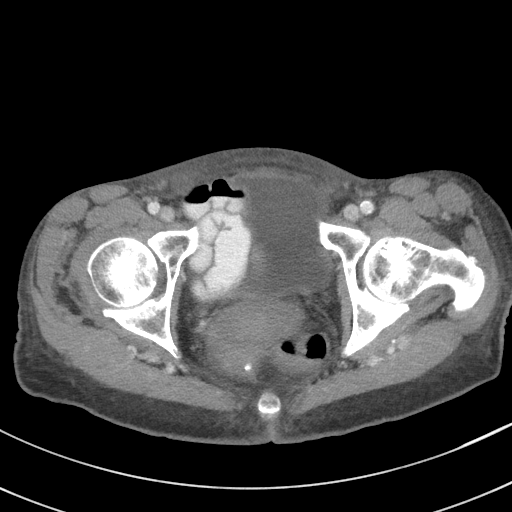
[im 22/82  soft-tissue]
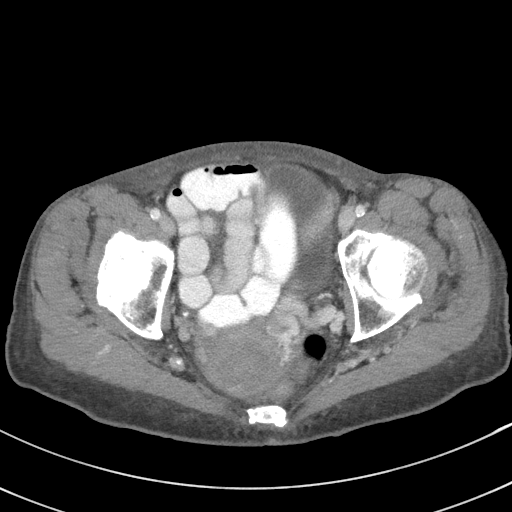
[im 30/82  soft-tissue]
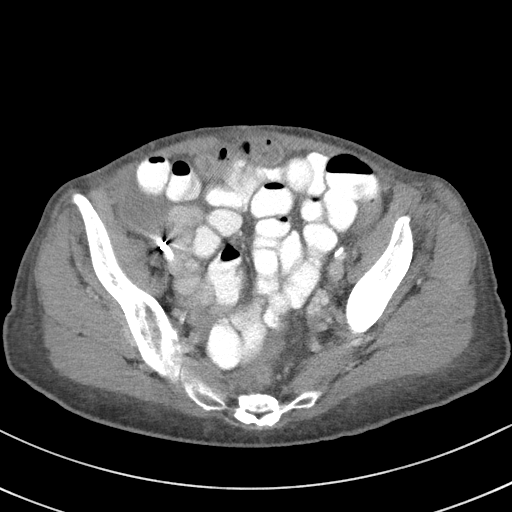
[im 35/82  soft-tissue]
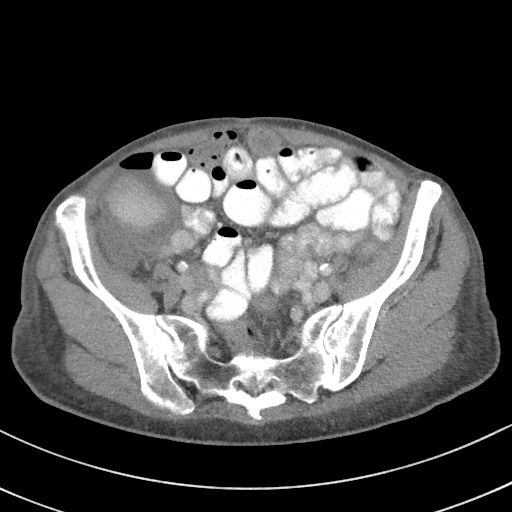
[im 43/82  soft-tissue]
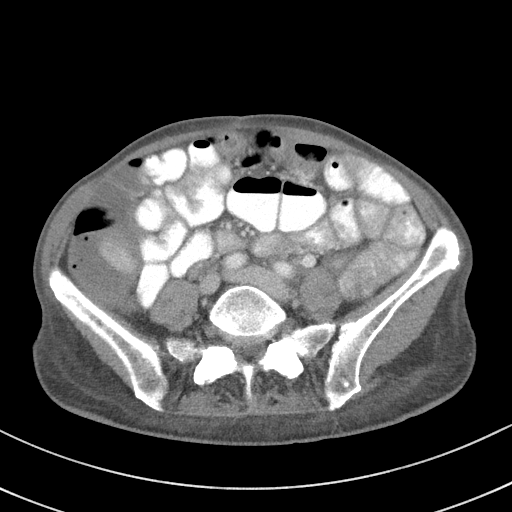
[im 47/82  soft-tissue]
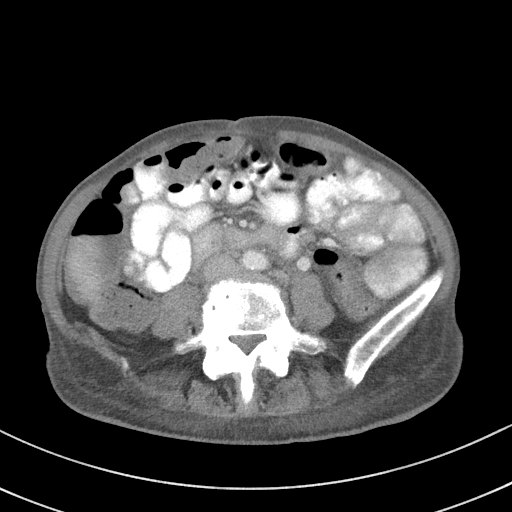
[im 52/82  soft-tissue]
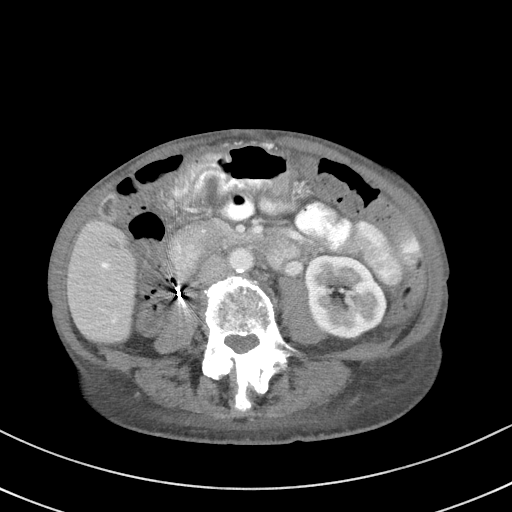
[im 52/82  bone]
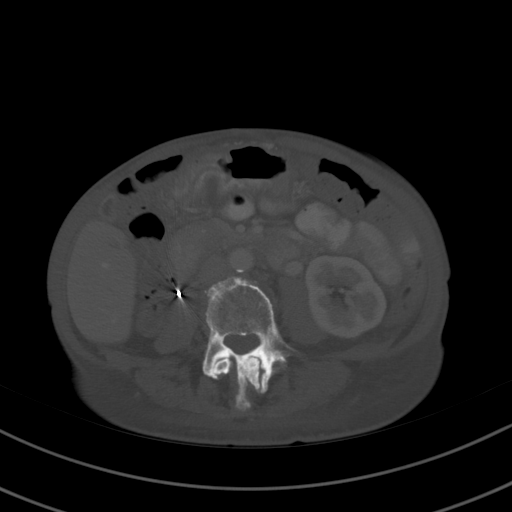
[im 60/82  soft-tissue]
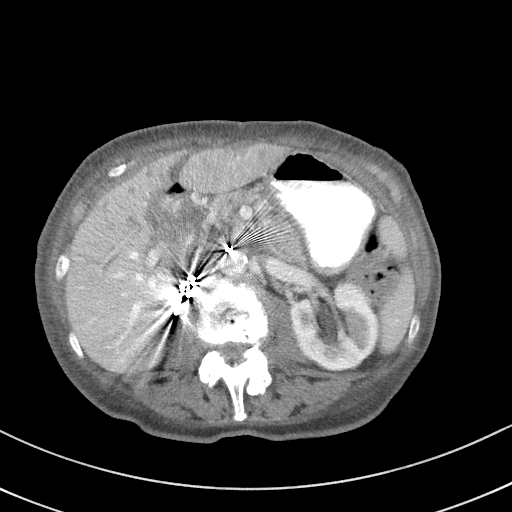
[im 64/82  soft-tissue]
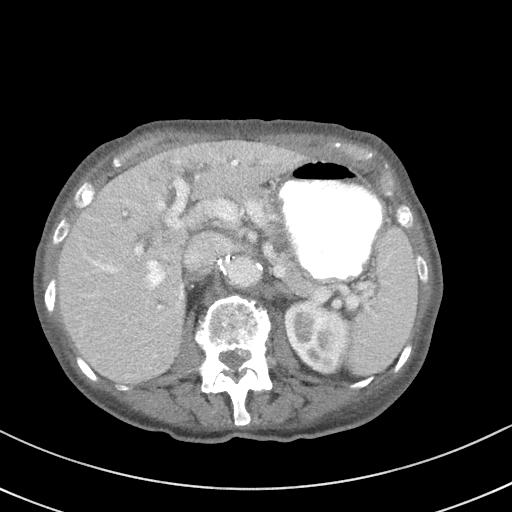
[im 69/82  soft-tissue]
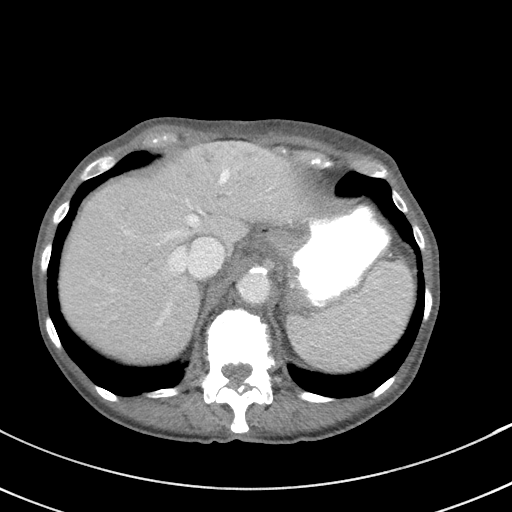
[im 77/82  soft-tissue]
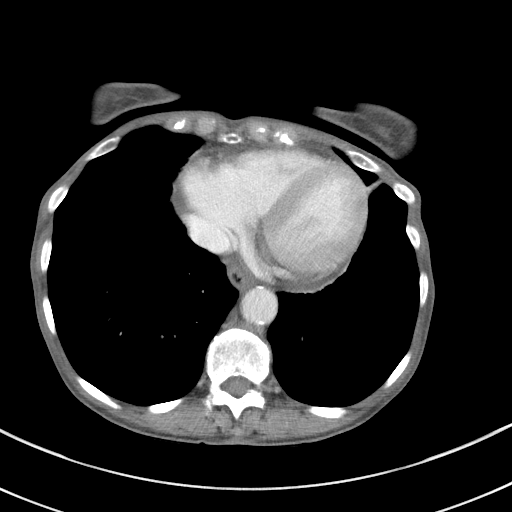

[Series 6: coronal st · coronal · 0.63mm/px · 3 of 73 slices shown]
[im 25/73  soft-tissue]
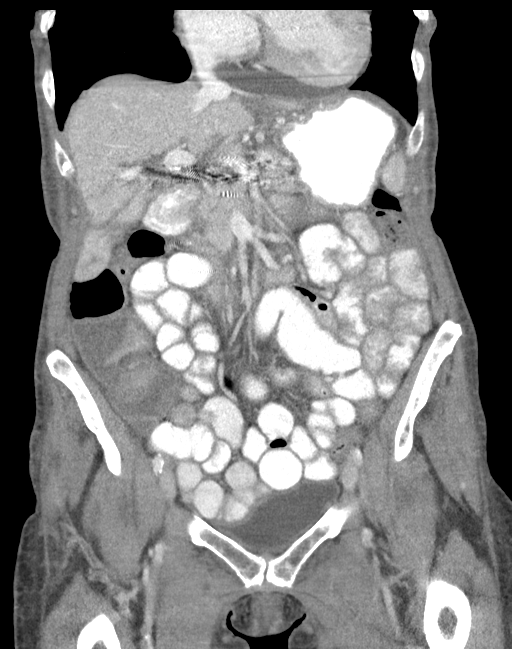
[im 33/73  soft-tissue]
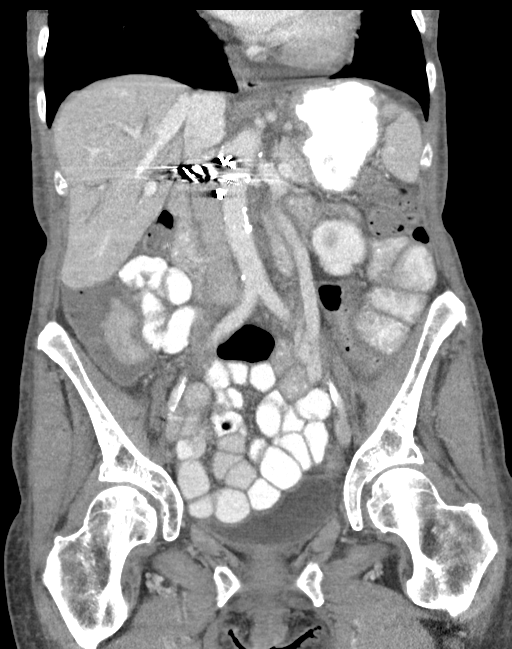
[im 41/73  soft-tissue]
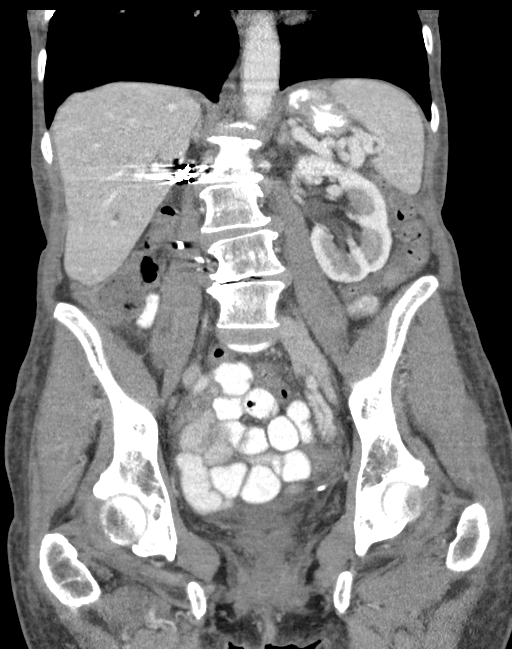

[16 of 46 positions shown; findings below may reference images not displayed]

FINDINGS: Lower chest: No acute abnormality.

Hepatobiliary: No gallstones are noted. Mild intrahepatic and
extrahepatic biliary ductal dilatation is noted.

Pancreas: Possible 17 x 13 mm pancreatic head mass may be present.
Mild pancreatic ductal dilatation is noted.

Spleen: Normal in size without focal abnormality.

Adrenals/Urinary Tract: Status post right nephrectomy. Left adrenal
gland and kidney appear normal. No hydronephrosis or renal
obstruction is noted. Urinary bladder appears normal.

Stomach/Bowel: There is no evidence of bowel obstruction.

Vascular/Lymphatic: Aortic atherosclerosis. No enlarged abdominal or
pelvic lymph nodes.

Reproductive: Probable small uterine fibroids are noted. No adnexal
abnormality is noted.

Other: No abdominal wall hernia or abnormality. No abdominopelvic
ascites.

Musculoskeletal: Severe multilevel degenerative disc disease is seen
in the lumbar spine.
IMPRESSION: Mild intrahepatic and extrahepatic biliary dilatation is noted which
may be due to possible pancreatic head mass. MRI of the abdomen with
and without contrast administration according to pancreatic protocol
is recommended. These results will be called to the ordering
clinician or representative by the Radiologist Assistant, and
communication documented in the PACS or zVision Dashboard.

Aortic atherosclerosis.

Probable small uterine fibroids.

## 2017-06-04 ENCOUNTER — Other Ambulatory Visit (HOSPITAL_COMMUNITY): Payer: Self-pay | Admitting: Oncology

## 2017-06-04 DIAGNOSIS — C9210 Chronic myeloid leukemia, BCR/ABL-positive, not having achieved remission: Secondary | ICD-10-CM

## 2017-06-04 NOTE — Telephone Encounter (Signed)
Will you please print Rx for Dr. Talbert Cage to sign?   Thanks!  gwd

## 2017-06-06 ENCOUNTER — Other Ambulatory Visit (HOSPITAL_COMMUNITY): Payer: Self-pay

## 2017-06-06 DIAGNOSIS — C9210 Chronic myeloid leukemia, BCR/ABL-positive, not having achieved remission: Secondary | ICD-10-CM

## 2017-06-06 MED ORDER — IMATINIB MESYLATE 400 MG PO TABS: ORAL_TABLET | ORAL | 5 refills | Status: DC

## 2017-07-21 DIAGNOSIS — E890 Postprocedural hypothyroidism: Secondary | ICD-10-CM | POA: Diagnosis not present

## 2017-07-21 DIAGNOSIS — E785 Hyperlipidemia, unspecified: Secondary | ICD-10-CM | POA: Diagnosis not present

## 2017-07-21 DIAGNOSIS — D34 Benign neoplasm of thyroid gland: Secondary | ICD-10-CM | POA: Diagnosis not present

## 2017-07-21 DIAGNOSIS — E0520 Thyrotoxicosis with toxic multinodular goiter without thyrotoxic crisis or storm: Secondary | ICD-10-CM | POA: Diagnosis not present

## 2017-07-31 DIAGNOSIS — D34 Benign neoplasm of thyroid gland: Secondary | ICD-10-CM | POA: Diagnosis not present

## 2017-07-31 DIAGNOSIS — E890 Postprocedural hypothyroidism: Secondary | ICD-10-CM | POA: Diagnosis not present

## 2017-07-31 DIAGNOSIS — E0520 Thyrotoxicosis with toxic multinodular goiter without thyrotoxic crisis or storm: Secondary | ICD-10-CM | POA: Diagnosis not present

## 2017-07-31 DIAGNOSIS — I480 Paroxysmal atrial fibrillation: Secondary | ICD-10-CM | POA: Diagnosis not present

## 2017-07-31 DIAGNOSIS — C9211 Chronic myeloid leukemia, BCR/ABL-positive, in remission: Secondary | ICD-10-CM | POA: Diagnosis not present

## 2017-08-05 DIAGNOSIS — I10 Essential (primary) hypertension: Secondary | ICD-10-CM | POA: Diagnosis not present

## 2017-08-05 DIAGNOSIS — E559 Vitamin D deficiency, unspecified: Secondary | ICD-10-CM | POA: Diagnosis not present

## 2017-08-05 DIAGNOSIS — D509 Iron deficiency anemia, unspecified: Secondary | ICD-10-CM | POA: Diagnosis not present

## 2017-08-05 DIAGNOSIS — R809 Proteinuria, unspecified: Secondary | ICD-10-CM | POA: Diagnosis not present

## 2017-08-05 DIAGNOSIS — N183 Chronic kidney disease, stage 3 (moderate): Secondary | ICD-10-CM | POA: Diagnosis not present

## 2017-08-07 ENCOUNTER — Other Ambulatory Visit (HOSPITAL_COMMUNITY): Payer: Self-pay | Admitting: Pulmonary Disease

## 2017-08-07 ENCOUNTER — Other Ambulatory Visit (HOSPITAL_COMMUNITY)
Admission: RE | Admit: 2017-08-07 | Discharge: 2017-08-07 | Disposition: A | Discharge status: Home / Self Care | Payer: Medicare Other | Source: Ambulatory Visit | Attending: Pulmonary Disease | Admitting: Pulmonary Disease

## 2017-08-07 DIAGNOSIS — Z1231 Encounter for screening mammogram for malignant neoplasm of breast: Secondary | ICD-10-CM

## 2017-08-07 DIAGNOSIS — Z79891 Long term (current) use of opiate analgesic: Secondary | ICD-10-CM | POA: Insufficient documentation

## 2017-08-07 LAB — RAPID URINE DRUG SCREEN, HOSP PERFORMED
AMPHETAMINES: NOT DETECTED
BARBITURATES: NOT DETECTED
BENZODIAZEPINES: POSITIVE — AB
Cocaine: NOT DETECTED
Opiates: NOT DETECTED
Tetrahydrocannabinol: NOT DETECTED

## 2017-08-08 DIAGNOSIS — N184 Chronic kidney disease, stage 4 (severe): Secondary | ICD-10-CM | POA: Diagnosis not present

## 2017-08-08 DIAGNOSIS — R809 Proteinuria, unspecified: Secondary | ICD-10-CM | POA: Diagnosis not present

## 2017-08-08 DIAGNOSIS — E870 Hyperosmolality and hypernatremia: Secondary | ICD-10-CM | POA: Diagnosis not present

## 2017-08-08 DIAGNOSIS — M9080 Osteopathy in diseases classified elsewhere, unspecified site: Secondary | ICD-10-CM | POA: Diagnosis not present

## 2017-08-08 DIAGNOSIS — E889 Metabolic disorder, unspecified: Secondary | ICD-10-CM | POA: Diagnosis not present

## 2017-08-18 ENCOUNTER — Ambulatory Visit (HOSPITAL_COMMUNITY): Payer: Medicare Other

## 2017-09-15 ENCOUNTER — Ambulatory Visit (HOSPITAL_COMMUNITY): Payer: Medicare Other

## 2017-09-19 DIAGNOSIS — Z0000 Encounter for general adult medical examination without abnormal findings: Secondary | ICD-10-CM | POA: Diagnosis not present

## 2017-09-20 DIAGNOSIS — K210 Gastro-esophageal reflux disease with esophagitis: Secondary | ICD-10-CM | POA: Diagnosis not present

## 2017-09-20 DIAGNOSIS — Z0000 Encounter for general adult medical examination without abnormal findings: Secondary | ICD-10-CM | POA: Diagnosis not present

## 2017-09-20 DIAGNOSIS — E785 Hyperlipidemia, unspecified: Secondary | ICD-10-CM | POA: Diagnosis not present

## 2017-09-20 DIAGNOSIS — K921 Melena: Secondary | ICD-10-CM | POA: Diagnosis not present

## 2017-09-20 DIAGNOSIS — I10 Essential (primary) hypertension: Secondary | ICD-10-CM | POA: Diagnosis not present

## 2017-09-21 ENCOUNTER — Ambulatory Visit (HOSPITAL_COMMUNITY): Payer: Medicare Other

## 2017-10-04 ENCOUNTER — Other Ambulatory Visit (HOSPITAL_COMMUNITY): Payer: Self-pay | Admitting: Pulmonary Disease

## 2017-10-04 DIAGNOSIS — N6320 Unspecified lump in the left breast, unspecified quadrant: Secondary | ICD-10-CM

## 2017-10-09 ENCOUNTER — Ambulatory Visit (HOSPITAL_COMMUNITY): Payer: Medicare Other

## 2017-10-10 ENCOUNTER — Encounter (HOSPITAL_COMMUNITY): Payer: Medicare Other

## 2017-10-10 ENCOUNTER — Encounter (HOSPITAL_COMMUNITY): Payer: Self-pay

## 2017-10-17 ENCOUNTER — Encounter (HOSPITAL_COMMUNITY): Payer: Medicare Other

## 2017-10-31 ENCOUNTER — Other Ambulatory Visit (HOSPITAL_COMMUNITY): Payer: Medicare Other

## 2017-10-31 ENCOUNTER — Ambulatory Visit (HOSPITAL_COMMUNITY)
Admission: RE | Admit: 2017-10-31 | Discharge: 2017-10-31 | Disposition: A | Discharge status: Home / Self Care | Payer: Medicare Other | Source: Ambulatory Visit | Attending: Pulmonary Disease | Admitting: Pulmonary Disease

## 2017-10-31 DIAGNOSIS — N6321 Unspecified lump in the left breast, upper outer quadrant: Secondary | ICD-10-CM | POA: Diagnosis not present

## 2017-10-31 DIAGNOSIS — N6320 Unspecified lump in the left breast, unspecified quadrant: Secondary | ICD-10-CM

## 2017-10-31 DIAGNOSIS — R922 Inconclusive mammogram: Secondary | ICD-10-CM | POA: Diagnosis not present

## 2017-11-01 ENCOUNTER — Other Ambulatory Visit: Payer: Self-pay | Admitting: Pulmonary Disease

## 2017-11-01 DIAGNOSIS — N6321 Unspecified lump in the left breast, upper outer quadrant: Secondary | ICD-10-CM | POA: Diagnosis not present

## 2017-11-01 DIAGNOSIS — N6320 Unspecified lump in the left breast, unspecified quadrant: Secondary | ICD-10-CM

## 2017-11-02 ENCOUNTER — Other Ambulatory Visit: Payer: Self-pay | Admitting: Pulmonary Disease

## 2017-11-02 DIAGNOSIS — N6320 Unspecified lump in the left breast, unspecified quadrant: Secondary | ICD-10-CM

## 2017-11-03 ENCOUNTER — Other Ambulatory Visit (HOSPITAL_COMMUNITY): Payer: Medicare Other

## 2017-11-06 ENCOUNTER — Ambulatory Visit
Admission: RE | Admit: 2017-11-06 | Discharge: 2017-11-06 | Disposition: A | Discharge status: Home / Self Care | Payer: Medicare Other | Source: Ambulatory Visit | Attending: Pulmonary Disease | Admitting: Pulmonary Disease

## 2017-11-06 DIAGNOSIS — N6321 Unspecified lump in the left breast, upper outer quadrant: Secondary | ICD-10-CM | POA: Diagnosis not present

## 2017-11-06 DIAGNOSIS — N6320 Unspecified lump in the left breast, unspecified quadrant: Secondary | ICD-10-CM

## 2017-11-06 DIAGNOSIS — N6012 Diffuse cystic mastopathy of left breast: Secondary | ICD-10-CM | POA: Diagnosis not present

## 2017-11-07 ENCOUNTER — Ambulatory Visit (HOSPITAL_COMMUNITY): Payer: Medicare Other | Admitting: Internal Medicine

## 2017-11-13 ENCOUNTER — Ambulatory Visit (HOSPITAL_COMMUNITY): Payer: Medicare Other | Admitting: Internal Medicine

## 2017-11-23 ENCOUNTER — Telehealth (INDEPENDENT_AMBULATORY_CARE_PROVIDER_SITE_OTHER): Payer: Self-pay | Admitting: Physical Medicine and Rehabilitation

## 2017-11-23 NOTE — Telephone Encounter (Signed)
Scheduled for 12/14/17 at 1030.

## 2017-12-06 ENCOUNTER — Inpatient Hospital Stay (HOSPITAL_COMMUNITY): Payer: Medicare Other | Attending: Hematology

## 2017-12-06 DIAGNOSIS — E538 Deficiency of other specified B group vitamins: Secondary | ICD-10-CM

## 2017-12-06 DIAGNOSIS — D649 Anemia, unspecified: Secondary | ICD-10-CM

## 2017-12-06 DIAGNOSIS — C9211 Chronic myeloid leukemia, BCR/ABL-positive, in remission: Secondary | ICD-10-CM | POA: Diagnosis not present

## 2017-12-06 DIAGNOSIS — C9210 Chronic myeloid leukemia, BCR/ABL-positive, not having achieved remission: Secondary | ICD-10-CM

## 2017-12-06 LAB — COMPREHENSIVE METABOLIC PANEL
ALT: 20 U/L (ref 14–54)
AST: 50 U/L — ABNORMAL HIGH (ref 15–41)
Albumin: 3.3 g/dL — ABNORMAL LOW (ref 3.5–5.0)
Alkaline Phosphatase: 62 U/L (ref 38–126)
Anion gap: 6 (ref 5–15)
BILIRUBIN TOTAL: 0.6 mg/dL (ref 0.3–1.2)
BUN: 21 mg/dL — ABNORMAL HIGH (ref 6–20)
CALCIUM: 8.2 mg/dL — AB (ref 8.9–10.3)
CHLORIDE: 103 mmol/L (ref 101–111)
CO2: 27 mmol/L (ref 22–32)
Creatinine, Ser: 1.83 mg/dL — ABNORMAL HIGH (ref 0.44–1.00)
GFR, EST AFRICAN AMERICAN: 30 mL/min — AB (ref >60)
GFR, EST NON AFRICAN AMERICAN: 25 mL/min — AB (ref >60)
Glucose, Bld: 85 mg/dL (ref 65–99)
Potassium: 4 mmol/L (ref 3.5–5.1)
Sodium: 136 mmol/L (ref 135–145)
TOTAL PROTEIN: 5.7 g/dL — AB (ref 6.5–8.1)

## 2017-12-06 LAB — IRON AND TIBC
IRON: 63 ug/dL (ref 28–170)
Saturation Ratios: 29 % (ref 10.4–31.8)
TIBC: 214 ug/dL — ABNORMAL LOW (ref 250–450)
UIBC: 151 ug/dL

## 2017-12-06 LAB — CBC WITH DIFFERENTIAL/PLATELET
Basophils Absolute: 0 10*3/uL (ref 0.0–0.1)
Basophils Relative: 1 %
EOS PCT: 3 %
Eosinophils Absolute: 0.1 10*3/uL (ref 0.0–0.7)
HEMATOCRIT: 29.1 % — AB (ref 36.0–46.0)
Hemoglobin: 9.4 g/dL — ABNORMAL LOW (ref 12.0–15.0)
LYMPHS ABS: 0.7 10*3/uL (ref 0.7–4.0)
LYMPHS PCT: 19 %
MCH: 33.7 pg (ref 26.0–34.0)
MCHC: 32.3 g/dL (ref 30.0–36.0)
MCV: 104.3 fL — AB (ref 78.0–100.0)
MONO ABS: 0.3 10*3/uL (ref 0.1–1.0)
Monocytes Relative: 9 %
NEUTROS ABS: 2.6 10*3/uL (ref 1.7–7.7)
Neutrophils Relative %: 68 %
PLATELETS: 140 10*3/uL — AB (ref 150–400)
RBC: 2.79 MIL/uL — AB (ref 3.87–5.11)
RDW: 15.5 % (ref 11.5–15.5)
WBC: 3.8 10*3/uL — ABNORMAL LOW (ref 4.0–10.5)

## 2017-12-06 LAB — FERRITIN: Ferritin: 150 ng/mL (ref 11–307)

## 2017-12-06 LAB — VITAMIN B12: VITAMIN B 12: 1445 pg/mL — AB (ref 180–914)

## 2017-12-07 LAB — FOLATE: Folate: 93 ng/mL (ref >5.9)

## 2017-12-11 LAB — BCR-ABL1, CML/ALL, PCR, QUANT

## 2017-12-13 ENCOUNTER — Inpatient Hospital Stay (HOSPITAL_COMMUNITY): Payer: Medicare Other | Attending: Hematology | Admitting: Internal Medicine

## 2017-12-13 ENCOUNTER — Encounter (HOSPITAL_COMMUNITY): Payer: Self-pay | Admitting: Internal Medicine

## 2017-12-13 VITALS — BP 101/88 | HR 85 | Temp 98.2°F | Resp 18 | Wt 117.9 lb

## 2017-12-13 DIAGNOSIS — D649 Anemia, unspecified: Secondary | ICD-10-CM | POA: Insufficient documentation

## 2017-12-13 DIAGNOSIS — R7989 Other specified abnormal findings of blood chemistry: Secondary | ICD-10-CM | POA: Insufficient documentation

## 2017-12-13 DIAGNOSIS — C9211 Chronic myeloid leukemia, BCR/ABL-positive, in remission: Secondary | ICD-10-CM | POA: Diagnosis not present

## 2017-12-13 DIAGNOSIS — K219 Gastro-esophageal reflux disease without esophagitis: Secondary | ICD-10-CM | POA: Diagnosis not present

## 2017-12-13 DIAGNOSIS — Z96651 Presence of right artificial knee joint: Secondary | ICD-10-CM | POA: Insufficient documentation

## 2017-12-13 DIAGNOSIS — K449 Diaphragmatic hernia without obstruction or gangrene: Secondary | ICD-10-CM | POA: Diagnosis not present

## 2017-12-13 DIAGNOSIS — Z79899 Other long term (current) drug therapy: Secondary | ICD-10-CM | POA: Diagnosis not present

## 2017-12-13 DIAGNOSIS — C9210 Chronic myeloid leukemia, BCR/ABL-positive, not having achieved remission: Secondary | ICD-10-CM

## 2017-12-13 DIAGNOSIS — E039 Hypothyroidism, unspecified: Secondary | ICD-10-CM | POA: Insufficient documentation

## 2017-12-13 MED ORDER — IMATINIB MESYLATE 400 MG PO TABS: ORAL_TABLET | ORAL | 5 refills | Status: DC

## 2017-12-13 NOTE — Progress Notes (Signed)
Diagnosis CML (chronic myelocytic leukemia) ([place]) - Plan: CBC with Differential/Platelet, Comprehensive metabolic panel, Lactate dehydrogenase, Ferritin, Iron and TIBC, Transferrin Saturation, Hemochromatosis DNA-PCR(c282y,h63d), BCR-ABL1, CML/ALL, PCR, QUANT, imatinib (GLEEVEC) 400 MG tablet  Elevated ferritin - Plan: CBC with Differential/Platelet, Comprehensive metabolic panel, Lactate dehydrogenase, Ferritin, Iron and TIBC, Transferrin Saturation, Hemochromatosis DNA-PCR(c282y,h63d), BCR-ABL1, CML/ALL, PCR, QUANT  Staging Cancer Staging No matching staging information was found for the patient.  Assessment and Plan:  1.  CML.  Pt was initially diagnosed in 2000; has remained on [place] with complete molecular response since that time.  BCR/ABL done 12/06/2017 is negative.  She remains in molecular remission.  Labs done 12/06/2017 show WBC 3.8, HB 9.4 and plts 140,000.  She needs refills on Gleevec and RX sent to pharmacy.  She will have repeat labs in 6 months.  She is advised to notify the office if any issues prior to that time.    2.  Elevated iron.  Ferritin was noted to be elevated at 151.  Iron studies have been elevated dating back to 2018.  Will check hemochromatosis gene evaluation.  Likely acute phase reactant due to anemia related to CKD.    3.  Anemia and CKD.  HB is 9.4.  Continue to follow-up with nephrology.  Will repeat labs on RTC.    4.  Elevated LFTs.  AST 50.  Labs have had this slight elevation since 2017.  Will repeat labs in 6 months.  She denies being on cholesterol medications.    5.  PASH.  Pt had Bilateral diagnostic mammogram done 10/31/2017 that showed IMPRESSION: Suspicious palpable 2.7 cm mass in the 2:30 position of the left breast. Findings are suspicious for invasive mammary carcinoma.  RECOMMENDATION: Ultrasound-guided left breast biopsy is recommended.    She had core  Biopsy of the left breast at the 2:30 position done on 11/06/2017 with pathology  returning as:    Breast, left, needle core biopsy, 2:30 o'clock - PSEUDOANGIOMATOUS STROMAL HYPERPLASIA ([place]). - FIBROCYSTIC CHANGES. - NO EVIDENCE OF MALIGNANCY.   I have discussed with her that I would recommend surgical evaluation.  Pt refuses to allow [place] to schedule surgical evaluation.  She will be set up for repeat imaging in 10/2018 for interval follow-up if she continues to refuse surgical evaluation.   Current Status:  Pt is seen today for follow-up.  She is here to go over labs.  She reports she needs refills on Gleevec.    Problem List Patient Active Problem List   Diagnosis Date Noted  . 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 Preoperative clearance [Z01.818] 10/27/2016  . Chronic right shoulder pain [M25.511, G89.29] 07/22/2016  . Unilateral primary osteoarthritis, right knee [M17.11] 06/15/2016  . CML (chronic myelocytic leukemia) ([place]) [C92.10] 05/04/2012    Past Medical History Past Medical History:  Diagnosis Date  . Blindness of right eye   . CML (chronic myelocytic leukemia) ([place])   . CML (chronic myelocytic leukemia) ([place]) 05/04/2012  . Depression   . Diverticulitis   . Drooping eyelid    left eye  . GERD (gastroesophageal reflux disease)   . H/O: CML (chronic myeloid leukemia)   . History of hiatal hernia   . History of migraine headaches    "continuous migraines" per Heme/Onc note 04/2015  . Hypothyroidism   . Injury of left shoulder   . Osteoarthritis   . Osteoarthritis    right knee  . Right knee pain   . Shingles   . Wears  glasses     Past Surgical History Past Surgical History:  Procedure Laterality Date  . APPENDECTOMY    . Bone Marrow Bx & Asp    . bowel blockage surgery    . COLONOSCOPY    . right kidney removed  1980  . Rt. Cataract surgery    . TOTAL KNEE ARTHROPLASTY Right 01/18/2017  . TOTAL KNEE ARTHROPLASTY Right 01/18/2017   Procedure: RIGHT TOTAL KNEE ARTHROPLASTY;  Surgeon: Newt Minion, MD;   Location: [facility];  Service: Orthopedics;  Laterality: Right;    Family History Family History  Problem Relation Age of Onset  . Cancer Maternal Uncle        bone  . Cancer Paternal Aunt        uterine  . Cancer Maternal Grandmother        breast  . Cancer Maternal Grandfather        throat  . Cancer Paternal Grandmother        leukemia  . Cancer Son        colon  . Stroke Mother   . COPD Father      Social History  reports that she has never smoked. She has never used smokeless tobacco. She reports that she does not drink alcohol or use drugs.  Medications  Current Outpatient Medications:  .  pyridOXINE (VITAMIN B-6) 100 MG tablet, Take 100 mg by mouth 2 (two) times daily., Disp: , Rfl:  .  vitamin B-12 (CYANOCOBALAMIN) 1000 MCG tablet, Take 1,000 mcg by mouth daily., Disp: , Rfl:  .  ALPRAZolam (XANAX) 1 MG tablet, Take 1 mg by mouth 2 (two) times daily as needed for anxiety or sleep. , Disp: , Rfl:  .  butorphanol (STADOL) 10 MG/ML nasal spray, Place 1 spray into the nose every 4 (four) hours as needed for migraine (migraines). , Disp: , Rfl:  .  imatinib (GLEEVEC) 400 MG tablet, TAKE 1 TABLET BY MOUTH DAILY WITH FOOD AND A LARGE GLASS OF WATER *ALWAYS USE SECONDARY INS XL HEALTH ON THIS MEDICATION*, Disp: 30 tablet, Rfl: 5 .  lidocaine (LIDODERM) 5 %, Place 1 patch onto the skin daily as needed (pain). Remove & Discard patch within 12 hours or as directed by MD, Disp: , Rfl:  .  loperamide (IMODIUM) 2 MG capsule, Take 2 mg by mouth as needed for diarrhea or loose stools., Disp: , Rfl:  .  oxyCODONE (ROXICODONE) 5 MG immediate release tablet, Take 1 tablet (5 mg total) by mouth 2 (two) times daily as needed for severe pain., Disp: 60 tablet, Rfl: 0 .  promethazine (PHENERGAN) 25 MG tablet, TAKE 1/2 TABLET BY MOUTH FOUR TIMES DAILY AS NEEDED, Disp: , Rfl: 5 .  sertraline (ZOLOFT) 50 MG tablet, Take 50 mg by mouth daily., Disp: , Rfl:  .  SYNTHROID 100 MCG tablet, Take 100 mcg by  mouth daily., Disp: , Rfl: 2  Allergies Nubain [nalbuphine hcl]; Perphenazine; and Vistaril [hydroxyzine hcl]  Review of Systems Review of Systems - Oncology ROS as per HPI otherwise 12 point ROS is negative.   Physical Exam  Vitals Wt Readings from Last 3 Encounters:  12/13/17 117 lb 14.4 oz (53.5 kg)  05/10/17 117 lb 1.6 oz (53.1 kg)  03/01/17 116 lb (52.6 kg)   Temp Readings from Last 3 Encounters:  12/13/17 98.2 F (36.8 C) (Oral)  05/10/17 98.8 F (37.1 C) (Oral)  01/20/17 99.3 F (37.4 C) (Oral)   BP Readings from Last 3 Encounters:  12/13/17 101/88  05/10/17 (!) 123/55  01/20/17 (!) 163/48   Pulse Readings from Last 3 Encounters:  12/13/17 85  05/10/17 (!) 57  01/20/17 90   Constitutional: Well-developed, well-nourished, and in no distress.   HENT: Head: Normocephalic and atraumatic.  Mouth/Throat: No oropharyngeal exudate. Mucosa moist. Eyes: Pupils are equal, round, and reactive to light. Conjunctivae are normal. No scleral icterus.  Neck: Normal range of motion. Neck supple. No JVD present.  Cardiovascular: Normal rate, regular rhythm and normal heart sounds.  Exam reveals no gallop and no friction rub.   No murmur heard. Pulmonary/Chest: Effort normal and breath sounds normal. No respiratory distress. No wheezes.No rales.  Abdominal: Soft. Bowel sounds are normal. No distension. There is no tenderness. There is no guarding.  Musculoskeletal: No edema or tenderness.  Lymphadenopathy: No cervical, axillary or supraclavicular adenopathy.  Neurological: Alert and oriented to person, place, and time. No cranial nerve deficit.  Skin: Skin is warm and dry. No rash noted. No erythema. No pallor.  Psychiatric: Affect and judgment normal.   Labs No visits with results within 3 Day(s) from this visit.  Latest known visit with results is:  Appointment on 12/06/2017  Component Date Value Ref Range Status  . b2a2 transcript 12/06/2017 Comment  % Final   Comment:  (NOTE)           <0.0032 % (sensitivity limit of assay)   . b3a2 transcript 12/06/2017 Comment  % Final   Comment: (NOTE)           <0.0032 % (sensitivity limit of assay)   . E1A2 Transcript 12/06/2017 Comment  % Final   Comment: (NOTE)           <0.0032 % (sensitivity limit of assay)   . Interpretation (BCRAL): 12/06/2017 Comment   Final   Comment: (NOTE) NEGATIVE for the BCR-ABL1 e1a2 (p190), e13a2 (b2a2, p210) and e14a2 (b3a2, p210) fusion transcripts. These results do not rule out the presence of rare BCR-ABL1 transcripts not detected by this assay.   . Director Review [facility]): 12/06/2017 Comment   Final   Comment: (NOTE) Constance Goltz, PhD, [facility]               Director, [place] for [place] [facility] [place], [place]   . Background: 12/06/2017 Comment   Corrected   Comment: (NOTE) This assay can detect three different types of BCR-ABL1 fusion transcripts associated with CML, ALL, and AML: e13a2 (previously b2a2) and e14a2 (previously b3a2) (major breakpoint, p210), as well as e1a2 (minor breakpoint, p190). The e13a2 and e14a2 transcript values are titrated to the current International Scale (IS). The standardized baseline is 100% BCR-ABL1 (IS) and major molecular response (MMR) is equivalent to 0.1% BCR-ABL1 (IS) corresponding to a 3-log reduction. Results should be correlated with appropriate clinical and laboratory information as indicated.   . Methodology 12/06/2017 Comment   Corrected   Comment: (NOTE) Total RNA is isolated from the sample and subject to a real-time, reverse transcriptase polymerase chain reaction (RT-PCR). The PCR primers and probes are specific for BCR-ABL1 e13a2, e14a2 and e1a2 fusion transcripts. The ABL1 transcript is amplified as the control  for cDNA quantity and quality. Serial dilutions of a validated positive control RNA  with known t(9;22) BCR-ABL1 are used as reference for quantification of BCR-ABL1 relative to ABL1. The numeric BCR-ABL1 level is reportd as % BCR-ABL1/ABL1 and the detection sensitivity is 4.5 log below the standard baseline. This test was developed and its performance characteristics determined by [facility]. It has not been cleared or approved by the Food and Drug Administration. References:    1. Anastasia Fiedler and Branford S: Seminars in Hematology 2003;       40 (suppl2):62-68.    2. White HE, et al. Blood 2010; 116: e111-117.    3. NCCN Clinical Practice Guidelines in Oncology, Chronic       Myeloid Leukemia. V2. 2017.                           Performed At: [facility] [address] [place], [place] [id number] Nechama Guard MD UV:[id number] Performed At: [facility] [place], [place] [id number] Nechama Guard MD VF:[id number] Performed at [facility], [address] [place], [place] [address]   . WBC 12/06/2017 3.8* 4.0 - 10.5 K/uL Final  . RBC 12/06/2017 2.79* 3.87 - 5.11 MIL/uL Final  . Hemoglobin 12/06/2017 9.4* 12.0 - 15.0 g/dL Final  . HCT 12/06/2017 29.1* 36.0 - 46.0 % Final  . MCV 12/06/2017 104.3* 78.0 - 100.0 fL Final  . MCH 12/06/2017 33.7  26.0 - 34.0 pg Final  . MCHC 12/06/2017 32.3  30.0 - 36.0 g/dL Final  . RDW 12/06/2017 15.5  11.5 - 15.5 % Final  . Platelets 12/06/2017 140* 150 - 400 K/uL Final  . Neutrophils Relative % 12/06/2017 68  % Final  . Neutro Abs 12/06/2017 2.6  1.7 - 7.7 K/uL Final  . Lymphocytes Relative 12/06/2017 19  % Final  . Lymphs Abs 12/06/2017 0.7  0.7 - 4.0 K/uL Final  . Monocytes Relative 12/06/2017 9  % Final  . Monocytes Absolute 12/06/2017 0.3  0.1 - 1.0 K/uL Final  . Eosinophils Relative 12/06/2017 3  % Final  . Eosinophils Absolute 12/06/2017 0.1  0.0 - 0.7 K/uL Final  . Basophils Relative 12/06/2017 1  % Final  . Basophils Absolute 12/06/2017 0.0  0.0 - 0.1 K/uL Final   Performed at [facility], [address], [place], [place] [address]  . Sodium 12/06/2017 136  135 - 145 mmol/L Final  . Potassium 12/06/2017 4.0  3.5 - 5.1 mmol/L Final  . Chloride 12/06/2017 103  101 - 111 mmol/L Final  . CO2 12/06/2017 27  22 - 32 mmol/L Final  . Glucose, Bld 12/06/2017 85  65 - 99 mg/dL Final  . BUN 12/06/2017 21* 6 - 20 mg/dL Final  . Creatinine, Ser 12/06/2017 1.83* 0.44 - 1.00 mg/dL Final  . Calcium 12/06/2017 8.2* 8.9 - 10.3 mg/dL Final  . Total Protein 12/06/2017 5.7* 6.5 - 8.1 g/dL Final  . Albumin 12/06/2017 3.3* 3.5 - 5.0 g/dL Final  . AST 12/06/2017 50* 15 - 41 U/L Final  . ALT 12/06/2017 20  14 - 54 U/L Final  . Alkaline Phosphatase 12/06/2017 62  38 - 126 U/L Final  . Total Bilirubin 12/06/2017 0.6  0.3 - 1.2 mg/dL Final  . GFR calc non Af Amer 12/06/2017 25* >60 mL/min Final  . GFR calc Af Amer 12/06/2017 30* >60 mL/min Final   Comment: (NOTE) The eGFR has been calculated using the  CKD EPI equation. This calculation has not been validated in all clinical situations. eGFR's persistently <60 mL/min signify possible Chronic Kidney Disease.   Georgiann Hahn gap 12/06/2017 6  5 - 15 Final   Performed at [facility], [address] [place], [place] [address]  . Vitamin B-12 12/06/2017 1,445* 180 - 914 pg/mL Final   Comment: (NOTE) This assay is not validated for testing neonatal or myeloproliferative syndrome specimens for Vitamin B12 levels. Performed at [facility], [place] [address], [place], [place] [address]   . Folate 12/06/2017 93.0  >5.9 ng/mL Final   Comment: RESULTS CONFIRMED BY MANUAL DILUTION Performed at [facility], [place] [address] [place], [place] [address]   . Iron 12/06/2017 63  28 - 170 ug/dL Final  . TIBC 12/06/2017 214* 250 - 450 ug/dL Final  . Saturation Ratios 12/06/2017 29  10.4 - 31.8 % Final  . UIBC 12/06/2017 151  ug/dL Final   Performed at [facility], [place] [address], [place], [place] [address]  . Ferritin 12/06/2017 150  11 - 307  ng/mL Final   Performed at [facility], [place] [address] [place], [place] [address]     Pathology Orders Placed This Encounter  Procedures  . CBC with Differential/Platelet    Standing Status:   Future    Standing Expiration Date:   12/14/2018  . Comprehensive metabolic panel    Standing Status:   Future    Standing Expiration Date:   12/14/2018  . Lactate dehydrogenase    Standing Status:   Future    Standing Expiration Date:   12/14/2018  . Ferritin    Standing Status:   Future    Standing Expiration Date:   12/14/2018  . Iron and TIBC    Standing Status:   Future    Standing Expiration Date:   12/14/2018  . Transferrin Saturation    Standing Status:   Future    Standing Expiration Date:   12/14/2018  . Hemochromatosis DNA-PCR(c282y,h63d)    Standing Status:   Future    Standing Expiration Date:   12/14/2018  . BCR-ABL1, CML/ALL, PCR, QUANT    Standing Status:   Future    Standing Expiration Date:   12/14/2018       Zoila Shutter MD

## 2017-12-13 NOTE — Patient Instructions (Signed)
[facility]  Discharge Instructions:  You were seen by Dr. Higgs today _______________________________________________________________  Thank you for choosing [facility] at [facility] to provide your oncology and hematology care.  To afford each patient quality time with our providers, please arrive at least 15 minutes before your scheduled appointment.  You need to re-schedule your appointment if you arrive 10 or more minutes late.  We strive to give you quality time with our providers, and arriving late affects you and other patients whose appointments are after yours.  Also, if you no show three or more times for appointments you may be dismissed from the clinic.  Again, thank you for choosing [facility] at [facility]. Our hope is that these requests will allow you access to exceptional care and in a timely manner. _______________________________________________________________  If you have questions after your visit, please contact our office at [phone] between the hours of 8:30 a.m. and 5:00 p.m. Voicemails left after 4:30 p.m. will not be returned until the following business day. _______________________________________________________________  For prescription refill requests, have your pharmacy contact our office. _______________________________________________________________  Recommendations made by the consultant and any test results will be sent to your referring physician. _______________________________________________________________ 

## 2017-12-14 ENCOUNTER — Ambulatory Visit (INDEPENDENT_AMBULATORY_CARE_PROVIDER_SITE_OTHER): Payer: Medicare Other | Admitting: Physical Medicine and Rehabilitation

## 2018-01-13 DIAGNOSIS — E559 Vitamin D deficiency, unspecified: Secondary | ICD-10-CM | POA: Diagnosis not present

## 2018-01-13 DIAGNOSIS — Z79899 Other long term (current) drug therapy: Secondary | ICD-10-CM | POA: Diagnosis not present

## 2018-01-13 DIAGNOSIS — R809 Proteinuria, unspecified: Secondary | ICD-10-CM | POA: Diagnosis not present

## 2018-01-13 DIAGNOSIS — N183 Chronic kidney disease, stage 3 (moderate): Secondary | ICD-10-CM | POA: Diagnosis not present

## 2018-01-13 DIAGNOSIS — I10 Essential (primary) hypertension: Secondary | ICD-10-CM | POA: Diagnosis not present

## 2018-01-13 DIAGNOSIS — D509 Iron deficiency anemia, unspecified: Secondary | ICD-10-CM | POA: Diagnosis not present

## 2018-02-09 DIAGNOSIS — I480 Paroxysmal atrial fibrillation: Secondary | ICD-10-CM | POA: Diagnosis not present

## 2018-02-09 DIAGNOSIS — E0520 Thyrotoxicosis with toxic multinodular goiter without thyrotoxic crisis or storm: Secondary | ICD-10-CM | POA: Diagnosis not present

## 2018-02-09 DIAGNOSIS — D34 Benign neoplasm of thyroid gland: Secondary | ICD-10-CM | POA: Diagnosis not present

## 2018-02-09 DIAGNOSIS — C9211 Chronic myeloid leukemia, BCR/ABL-positive, in remission: Secondary | ICD-10-CM | POA: Diagnosis not present

## 2018-02-13 DIAGNOSIS — E0520 Thyrotoxicosis with toxic multinodular goiter without thyrotoxic crisis or storm: Secondary | ICD-10-CM | POA: Diagnosis not present

## 2018-02-13 DIAGNOSIS — I480 Paroxysmal atrial fibrillation: Secondary | ICD-10-CM | POA: Diagnosis not present

## 2018-02-13 DIAGNOSIS — D34 Benign neoplasm of thyroid gland: Secondary | ICD-10-CM | POA: Diagnosis not present

## 2018-02-13 DIAGNOSIS — E785 Hyperlipidemia, unspecified: Secondary | ICD-10-CM | POA: Diagnosis not present

## 2018-02-13 DIAGNOSIS — E890 Postprocedural hypothyroidism: Secondary | ICD-10-CM | POA: Diagnosis not present

## 2018-02-13 DIAGNOSIS — C9211 Chronic myeloid leukemia, BCR/ABL-positive, in remission: Secondary | ICD-10-CM | POA: Diagnosis not present

## 2018-03-01 ENCOUNTER — Other Ambulatory Visit (HOSPITAL_COMMUNITY): Payer: Self-pay | Admitting: Pulmonary Disease

## 2018-03-01 DIAGNOSIS — C9290 Myeloid leukemia, unspecified, not having achieved remission: Secondary | ICD-10-CM | POA: Diagnosis not present

## 2018-03-01 DIAGNOSIS — G43909 Migraine, unspecified, not intractable, without status migrainosus: Secondary | ICD-10-CM | POA: Diagnosis not present

## 2018-03-01 DIAGNOSIS — Z1211 Encounter for screening for malignant neoplasm of colon: Secondary | ICD-10-CM | POA: Diagnosis not present

## 2018-03-01 DIAGNOSIS — N183 Chronic kidney disease, stage 3 (moderate): Secondary | ICD-10-CM | POA: Diagnosis not present

## 2018-03-01 DIAGNOSIS — R1032 Left lower quadrant pain: Secondary | ICD-10-CM

## 2018-03-01 DIAGNOSIS — I10 Essential (primary) hypertension: Secondary | ICD-10-CM | POA: Diagnosis not present

## 2018-03-07 ENCOUNTER — Other Ambulatory Visit (HOSPITAL_COMMUNITY): Payer: Self-pay | Admitting: Pulmonary Disease

## 2018-03-07 ENCOUNTER — Ambulatory Visit (HOSPITAL_COMMUNITY)
Admission: RE | Admit: 2018-03-07 | Discharge: 2018-03-07 | Disposition: A | Discharge status: Home / Self Care | Payer: Medicare Other | Source: Ambulatory Visit | Attending: Pulmonary Disease | Admitting: Pulmonary Disease

## 2018-03-07 DIAGNOSIS — I313 Pericardial effusion (noninflammatory): Secondary | ICD-10-CM | POA: Diagnosis not present

## 2018-03-07 DIAGNOSIS — R921 Mammographic calcification found on diagnostic imaging of breast: Secondary | ICD-10-CM | POA: Diagnosis not present

## 2018-03-07 DIAGNOSIS — R1032 Left lower quadrant pain: Secondary | ICD-10-CM

## 2018-03-07 DIAGNOSIS — R109 Unspecified abdominal pain: Secondary | ICD-10-CM | POA: Diagnosis not present

## 2018-03-07 DIAGNOSIS — Z905 Acquired absence of kidney: Secondary | ICD-10-CM | POA: Insufficient documentation

## 2018-03-07 DIAGNOSIS — K625 Hemorrhage of anus and rectum: Secondary | ICD-10-CM | POA: Diagnosis not present

## 2018-03-07 DIAGNOSIS — J90 Pleural effusion, not elsewhere classified: Secondary | ICD-10-CM | POA: Diagnosis not present

## 2018-03-15 ENCOUNTER — Ambulatory Visit (HOSPITAL_COMMUNITY): Payer: Medicare Other

## 2018-03-28 ENCOUNTER — Ambulatory Visit (INDEPENDENT_AMBULATORY_CARE_PROVIDER_SITE_OTHER): Payer: Medicare Other | Admitting: Internal Medicine

## 2018-04-03 ENCOUNTER — Other Ambulatory Visit (HOSPITAL_COMMUNITY): Payer: Self-pay | Admitting: Pulmonary Disease

## 2018-04-03 DIAGNOSIS — R229 Localized swelling, mass and lump, unspecified: Principal | ICD-10-CM

## 2018-04-03 DIAGNOSIS — IMO0002 Reserved for concepts with insufficient information to code with codable children: Secondary | ICD-10-CM

## 2018-04-03 DIAGNOSIS — Z09 Encounter for follow-up examination after completed treatment for conditions other than malignant neoplasm: Secondary | ICD-10-CM

## 2018-04-05 ENCOUNTER — Encounter (HOSPITAL_COMMUNITY): Payer: Self-pay | Admitting: Emergency Medicine

## 2018-04-05 ENCOUNTER — Emergency Department (HOSPITAL_COMMUNITY): Payer: Medicare Other

## 2018-04-05 ENCOUNTER — Emergency Department (HOSPITAL_COMMUNITY)
Admission: EM | Admit: 2018-04-05 | Discharge: 2018-04-05 | Disposition: A | Discharge status: Home / Self Care | Payer: Medicare Other | Attending: Emergency Medicine | Admitting: Emergency Medicine

## 2018-04-05 ENCOUNTER — Other Ambulatory Visit: Payer: Self-pay

## 2018-04-05 DIAGNOSIS — Z96651 Presence of right artificial knee joint: Secondary | ICD-10-CM | POA: Insufficient documentation

## 2018-04-05 DIAGNOSIS — E039 Hypothyroidism, unspecified: Secondary | ICD-10-CM | POA: Insufficient documentation

## 2018-04-05 DIAGNOSIS — R1084 Generalized abdominal pain: Secondary | ICD-10-CM | POA: Diagnosis not present

## 2018-04-05 DIAGNOSIS — Z79899 Other long term (current) drug therapy: Secondary | ICD-10-CM | POA: Insufficient documentation

## 2018-04-05 DIAGNOSIS — G8929 Other chronic pain: Secondary | ICD-10-CM | POA: Diagnosis not present

## 2018-04-05 DIAGNOSIS — R42 Dizziness and giddiness: Secondary | ICD-10-CM | POA: Diagnosis not present

## 2018-04-05 DIAGNOSIS — R109 Unspecified abdominal pain: Secondary | ICD-10-CM | POA: Diagnosis not present

## 2018-04-05 DIAGNOSIS — N63 Unspecified lump in unspecified breast: Secondary | ICD-10-CM

## 2018-04-05 DIAGNOSIS — R197 Diarrhea, unspecified: Secondary | ICD-10-CM

## 2018-04-05 DIAGNOSIS — R079 Chest pain, unspecified: Secondary | ICD-10-CM | POA: Insufficient documentation

## 2018-04-05 LAB — CBC
HEMATOCRIT: 32.8 % — AB (ref 36.0–46.0)
HEMOGLOBIN: 10.5 g/dL — AB (ref 12.0–15.0)
MCH: 33.2 pg (ref 26.0–34.0)
MCHC: 32 g/dL (ref 30.0–36.0)
MCV: 103.8 fL — ABNORMAL HIGH (ref 78.0–100.0)
Platelets: 119 10*3/uL — ABNORMAL LOW (ref 150–400)
RBC: 3.16 MIL/uL — ABNORMAL LOW (ref 3.87–5.11)
RDW: 15 % (ref 11.5–15.5)
WBC: 4.4 10*3/uL (ref 4.0–10.5)

## 2018-04-05 LAB — URINALYSIS, ROUTINE W REFLEX MICROSCOPIC
BILIRUBIN URINE: NEGATIVE
Bacteria, UA: NONE SEEN
GLUCOSE, UA: NEGATIVE mg/dL
KETONES UR: NEGATIVE mg/dL
LEUKOCYTES UA: NEGATIVE
NITRITE: NEGATIVE
PH: 6 (ref 5.0–8.0)
Protein, ur: NEGATIVE mg/dL
SPECIFIC GRAVITY, URINE: 1.005 (ref 1.005–1.030)

## 2018-04-05 LAB — COMPREHENSIVE METABOLIC PANEL
ALBUMIN: 4.2 g/dL (ref 3.5–5.0)
ALK PHOS: 78 U/L (ref 38–126)
ALT: 32 U/L (ref 0–44)
ANION GAP: 8 (ref 5–15)
AST: 49 U/L — AB (ref 15–41)
BUN: 17 mg/dL (ref 8–23)
CALCIUM: 9.1 mg/dL (ref 8.9–10.3)
CO2: 29 mmol/L (ref 22–32)
CREATININE: 1.39 mg/dL — AB (ref 0.44–1.00)
Chloride: 100 mmol/L (ref 98–111)
GFR calc Af Amer: 41 mL/min — ABNORMAL LOW (ref >60)
GFR calc non Af Amer: 36 mL/min — ABNORMAL LOW (ref >60)
Glucose, Bld: 95 mg/dL (ref 70–99)
Potassium: 4.1 mmol/L (ref 3.5–5.1)
SODIUM: 137 mmol/L (ref 135–145)
Total Bilirubin: 0.5 mg/dL (ref 0.3–1.2)
Total Protein: 7 g/dL (ref 6.5–8.1)

## 2018-04-05 LAB — TROPONIN I
TROPONIN I: 0.03 ng/mL — AB (ref <0.03)
Troponin I: 0.03 ng/mL (ref <0.03)

## 2018-04-05 LAB — LIPASE, BLOOD: LIPASE: 33 U/L (ref 11–51)

## 2018-04-05 MED ORDER — OXYCODONE HCL 5 MG PO TABS
5.0000 mg | ORAL_TABLET | Freq: Once | ORAL | Status: AC
  Administered 2018-04-05: 5 mg via ORAL
  Filled 2018-04-05: qty 1

## 2018-04-05 NOTE — ED Notes (Signed)
Date and time results received: 04/05/18 10:42 PM    Test: troponin Critical Value: 0.03  Name of Provider Notified: Dr Thurnell Garbe   Orders Received? Or Actions Taken?: MD notfied

## 2018-04-05 NOTE — Discharge Instructions (Addendum)
Take your usual prescriptions as previously directed.  Increase your fluid intake (ie:  Gatoraide) for the next few days. Avoid full strength juices, as well as milk and milk products until your diarrhea has resolved.   Call your regular medical doctor and the [facility] tomorrow to schedule a follow up appointment in the next 2 days. Return to the Emergency Department immediately sooner if worsening.

## 2018-04-05 NOTE — ED Notes (Signed)
Date and time results received: 04/05/18 8:46 PM   Test: troponin Critical Value: 0.03  Name of Provider Notified: MD Thurnell Garbe  Orders Received? Or Actions Taken?: MD notified

## 2018-04-05 NOTE — ED Triage Notes (Signed)
Sent here by Dr [name], weight loss, light headed with headache, decreased appetite. Concerned with possible dehydration.

## 2018-04-05 NOTE — ED Provider Notes (Signed)
[facility] EMERGENCY DEPARTMENT Provider Note   CSN: [id number] Arrival date & time: 04/05/18  1517     History   Chief Complaint Chief Complaint  Patient presents with  . Diarrhea  . Abdominal Pain    HPI Ana Nelson is a 77 y.o. female.  HPI  Pt was seen at 1925. Per pt, c/o gradual onset and persistence of constant generalized abd "pain" for the past several months. Pt states she has been intermittently having watery diarrheal stools for "a while, depending on what I eat," most recent yesterday. Pt states she has had persistent weight loss over the past 16 years "when I was first dx with cancer." Pt states she has been eating well "but I just don't gain weight." Pt states her PMD sent her to the ED today "to make sure I wasn't dehydrated." Denies CP/SOB, no cough, no fevers, no rash, no N/V, no black or blood in stools, no back pain.    Past Medical History:  Diagnosis Date  . Blindness of right eye   . CML (chronic myelocytic leukemia) ([place])   . 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 Depression   . Diverticulitis   . Drooping eyelid    left eye  . GERD (gastroesophageal reflux disease)   . H/O: CML (chronic myeloid leukemia)   . History of hiatal hernia   . History of migraine headaches    "continuous migraines" per Heme/Onc note 04/2015  . Hypothyroidism   . Injury of left shoulder   . Osteoarthritis   . Osteoarthritis    right knee  . Right knee pain   . Shingles   . Wears glasses     Patient Active Problem List   Diagnosis Date Noted  . 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 Preoperative clearance 10/27/2016  . Chronic right shoulder pain 07/22/2016  . Unilateral primary osteoarthritis, right knee 06/15/2016  . 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 APPENDECTOMY    . Bone Marrow Bx & Asp    . bowel blockage surgery    . COLONOSCOPY    . right  kidney removed  1980  . Rt. Cataract surgery    . TOTAL KNEE ARTHROPLASTY Right 01/18/2017  . TOTAL KNEE ARTHROPLASTY Right 01/18/2017   Procedure: RIGHT TOTAL KNEE ARTHROPLASTY;  Surgeon: Newt Minion, MD;  Location: [place];  Service: Orthopedics;  Laterality: Right;     OB History   None      Home Medications    Prior to Admission medications   Medication Sig Start Date End Date Taking? Authorizing Provider  ALPRAZolam Duanne Moron) 1 MG tablet Take 1 mg by mouth 2 (two) times daily as needed for anxiety or sleep.     [provider]  butorphanol (STADOL) 10 MG/ML nasal spray Place 1 spray into the nose every 4 (four) hours as needed for migraine (migraines).     [provider]  imatinib (GLEEVEC) 400 MG tablet TAKE 1 TABLET BY MOUTH DAILY WITH FOOD AND A LARGE GLASS OF WATER *ALWAYS USE SECONDARY INS XL HEALTH ON THIS MEDICATION* 12/13/17   Higgs, Mathis Dad, MD  lidocaine (LIDODERM) 5 % Place 1 patch onto the skin daily as needed (pain). Remove & Discard patch within 12 hours or as directed by MD    [provider]  loperamide (IMODIUM) 2 MG capsule Take 2 mg by mouth as needed for diarrhea or  loose stools.    [provider]  oxyCODONE (ROXICODONE) 5 MG immediate release tablet Take 1 tablet (5 mg total) by mouth 2 (two) times daily as needed for severe pain. 03/01/17   Suzan Slick, NP  promethazine (PHENERGAN) 25 MG tablet TAKE 1/2 TABLET BY MOUTH FOUR TIMES DAILY AS NEEDED 01/23/17   [provider]  pyridOXINE (VITAMIN B-6) 100 MG tablet Take 100 mg by mouth 2 (two) times daily.    [provider]  sertraline (ZOLOFT) 50 MG tablet Take 50 mg by mouth daily.    [provider]  SYNTHROID 100 MCG tablet Take 100 mcg by mouth daily. 05/08/17   [provider]  vitamin B-12 (CYANOCOBALAMIN) 1000 MCG tablet Take 1,000 mcg by mouth daily.    [provider]    Family History Family History  Problem Relation Age of  Onset  . Cancer Maternal Uncle        bone  . Cancer Paternal Aunt        uterine  . Cancer Maternal Grandmother        breast  . Cancer Maternal Grandfather        throat  . Cancer Paternal Grandmother        leukemia  . Cancer Son        colon  . Stroke Mother   . COPD Father     Social History Social History   Tobacco Use  . Smoking status: Never Smoker  . Smokeless tobacco: Never Used  Substance Use Topics  . Alcohol use: No  . Drug use: No     Allergies   Nubain [nalbuphine hcl]; Perphenazine; and Vistaril [hydroxyzine hcl]   Review of Systems Review of Systems ROS: Statement: All systems negative except as marked or noted in the HPI; Constitutional: Negative for fever and chills. ; ; Eyes: Negative for eye pain, redness and discharge. ; ; ENMT: Negative for ear pain, hoarseness, nasal congestion, sinus pressure and sore throat. ; ; Cardiovascular: Negative for chest pain, palpitations, diaphoresis, dyspnea and peripheral edema. ; ; Respiratory: Negative for cough, wheezing and stridor. ; ; Gastrointestinal: +diarrhea, abd pain. Negative for nausea, vomiting, blood in stool, hematemesis, jaundice and rectal bleeding. . ; ; Genitourinary: Negative for dysuria, flank pain and hematuria. ; ; Musculoskeletal: Negative for back pain and neck pain. Negative for swelling and trauma.; ; Skin: Negative for pruritus, rash, abrasions, blisters, bruising and skin lesion.; ; Neuro: Negative for headache, lightheadedness and neck stiffness. Negative for weakness, altered level of consciousness, altered mental status, extremity weakness, paresthesias, involuntary movement, seizure and syncope.       Physical Exam Updated Vital Signs BP (!) 117/98 (BP Location: Right Arm)   Pulse 70   Temp 98.1 F (36.7 C) (Oral)   Resp 16   SpO2 98%    Patient Vitals for the past 24 hrs:  BP Temp Temp src Pulse Resp SpO2  04/05/18 2215 - - - 74 11 100 %  04/05/18 2145 - - - 62 14 97 %    04/05/18 2100 (!) 115/99 - - (!) 108 12 91 %  04/05/18 2045 - - - (!) 112 (!) 21 90 %  04/05/18 2030 132/76 - - - 17 -  04/05/18 2000 (!) 126/95 - - - 15 -  04/05/18 1523 (!) 117/98 98.1 F (36.7 C) Oral 70 16 98 %   20:00 Orthostatic Vital Signs TE  Orthostatic Lying   BP- Lying: 126/95Abnormal  Pulse- Lying: 61       Orthostatic Sitting  BP- Sitting: 146/70   Pulse- Sitting: 60       Orthostatic Standing at 0 minutes  BP- Standing at 0 minutes: 121/62   Pulse- Standing at 0 minutes: 64      Physical Exam 1930: Physical examination:  Nursing notes reviewed; Vital signs and O2 SAT reviewed;  Constitutional: Well developed, Well nourished, Well hydrated, In no acute distress; Head:  Normocephalic, atraumatic; Eyes: EOMI, PERRL, No scleral icterus; ENMT: Mouth and pharynx normal, Mucous membranes moist; Neck: Supple, Full range of motion, No lymphadenopathy; Cardiovascular: Regular rate and rhythm, No gallop; Respiratory: Breath sounds clear & equal bilaterally, No wheezes.  Speaking full sentences with ease, Normal respiratory effort/excursion; Chest: Nontender, Movement normal.; Abdomen: Soft, Nontender, Nondistended, Normal bowel sounds; Genitourinary: No CVA tenderness;  Extremities: Peripheral pulses normal, No tenderness, No edema, No calf edema or asymmetry.; Neuro: AA&Ox3, Major CN grossly intact. No facial droop. Speech clear. Grips equal. Strength 5/5 equal bilat UE's and LE's. No gross focal motor or sensory deficits in extremities. Climbs on and off stretcher easily by herself. Gait steady..; Skin: Color normal, Warm, Dry.   ED Treatments / Results  Labs (all labs ordered are listed, but only abnormal results are displayed)   EKG EKG Interpretation  Date/Time:  Thursday April 05 2018 19:59:13 EDT Ventricular Rate:  60 PR Interval:    QRS Duration: 91 QT Interval:  442 QTC Calculation: 442 R Axis:   53 Text Interpretation:  Sinus rhythm Baseline wander  Artifact When compared with ECG of 10/08/2008 No significant change was found Confirmed by Francine Graven [phone]) on 04/05/2018 8:22:44 PM   Radiology   Procedures Procedures (including critical care time)  Medications Ordered in ED Medications - No data to display   Initial Impression / Assessment and Plan / ED Course  I have reviewed the triage vital signs and the nursing notes.  Pertinent labs & imaging results that were available during my care of the patient were reviewed by me and considered in my medical decision making (see chart for details).  MDM Reviewed: previous chart, nursing note and vitals Reviewed previous: labs and ECG Interpretation: labs, ECG, x-ray and CT scan   Results for orders placed or performed during the hospital encounter of 04/05/18  CBC  Result Value Ref Range   WBC 4.4 4.0 - 10.5 K/uL   RBC 3.16 (L) 3.87 - 5.11 MIL/uL   Hemoglobin 10.5 (L) 12.0 - 15.0 g/dL   HCT 32.8 (L) 36.0 - 46.0 %   MCV 103.8 (H) 78.0 - 100.0 fL   MCH 33.2 26.0 - 34.0 pg   MCHC 32.0 30.0 - 36.0 g/dL   RDW 15.0 11.5 - 15.5 %   Platelets 119 (L) 150 - 400 K/uL  Comprehensive metabolic panel  Result Value Ref Range   Sodium 137 135 - 145 mmol/L   Potassium 4.1 3.5 - 5.1 mmol/L   Chloride 100 98 - 111 mmol/L   CO2 29 22 - 32 mmol/L   Glucose, Bld 95 70 - 99 mg/dL   BUN 17 8 - 23 mg/dL   Creatinine, Ser 1.39 (H) 0.44 - 1.00 mg/dL   Calcium 9.1 8.9 - 10.3 mg/dL   Total Protein 7.0 6.5 - 8.1 g/dL   Albumin 4.2 3.5 - 5.0 g/dL   AST 49 (H) 15 - 41 U/L   ALT 32 0 - 44 U/L   Alkaline Phosphatase 78 38 - 126  U/L   Total Bilirubin 0.5 0.3 - 1.2 mg/dL   GFR calc non Af Amer 36 (L) >60 mL/min   GFR calc Af Amer 41 (L) >60 mL/min   Anion gap 8 5 - 15  Urinalysis, Routine w reflex microscopic  Result Value Ref Range   Color, Urine YELLOW YELLOW   APPearance CLEAR CLEAR   Specific Gravity, Urine 1.005 1.005 - 1.030   pH 6.0 5.0 - 8.0   Glucose, UA NEGATIVE NEGATIVE mg/dL    Hgb urine dipstick SMALL (A) NEGATIVE   Bilirubin Urine NEGATIVE NEGATIVE   Ketones, ur NEGATIVE NEGATIVE mg/dL   Protein, ur NEGATIVE NEGATIVE mg/dL   Nitrite NEGATIVE NEGATIVE   Leukocytes, UA NEGATIVE NEGATIVE   RBC / HPF 0-5 0 - 5 RBC/hpf   WBC, UA 0-5 0 - 5 WBC/hpf   Bacteria, UA NONE SEEN NONE SEEN   Squamous Epithelial / LPF 0-5 0 - 5  Lipase, blood  Result Value Ref Range   Lipase 33 11 - 51 U/L  Troponin I  Result Value Ref Range   Troponin I 0.03 (HH) <0.03 ng/mL  Troponin I  Result Value Ref Range   Troponin I 0.03 (HH) <0.03 ng/mL   Ct Abdomen Pelvis Wo Contrast Result Date: 04/05/2018 CLINICAL DATA:  Weight loss with headache and lightheadedness as well as decreased appetite. Possible dehydration. Diarrhea. EXAM: CT ABDOMEN AND PELVIS WITHOUT CONTRAST TECHNIQUE: Multidetector CT imaging of the abdomen and pelvis was performed following the standard protocol without IV contrast. COMPARISON:  03/07/2018, 06/27/2016 and 08/28/2015 FINDINGS: Lower chest: Lung bases are within normal. Stable moderate pericardial effusion. Hepatobiliary: Gallbladder is contracted and not accurately evaluated. Liver and biliary tree otherwise unremarkable. Pancreas: Within normal. Spleen: Within normal. Adrenals/Urinary Tract: Adrenal glands are within normal. Evidence of previous right nephrectomy. Surgical clips over the right renal fossa cause moderate streak artifact. Left kidney is normal size without hydronephrosis or nephrolithiasis. Left ureter and bladder are normal. Stomach/Bowel: Stomach and small bowel are within normal. Previous appendectomy. Mild fecal retention over the right colon. Colon is otherwise unremarkable. Vascular/Lymphatic: Mild calcified plaque over the abdominal aorta and iliac arteries. No definite adenopathy. Reproductive: Uterus appears retroverted with small calcified fibroid. Otherwise unremarkable. Other: No free fluid or focal inflammatory change. Musculoskeletal:  Degenerative changes of the spine and mild degenerate change of the hips. Stable sclerotic foci left femur. IMPRESSION: No acute findings in the abdomen/pelvis. Contracted gallbladder not accurately evaluated on this examination. Stable moderate pericardial effusion. Right nephrectomy which surgical clips over the right renal fossa causing moderate streak artifact. Aortic Atherosclerosis (ICD10-I70.0). Electronically Signed   By: Marin Olp M.D.   On: 04/05/2018 21:11   Dg Chest 2 View Result Date: 04/05/2018 CLINICAL DATA:  Abdominal pain, left side chest pain EXAM: CHEST - 2 VIEW COMPARISON:  11/07/2016 FINDINGS: Cardiomegaly. Lungs clear. No effusions or edema. No acute bony abnormality. IMPRESSION: Cardiomegaly.  No active disease. Electronically Signed   By: Rolm Baptise M.D.   On: 04/05/2018 20:43    1930:  Pt also states she "has a lump" in her left breast she "needs looked at." Left breast approximately 2:00 area with cluster of multiple NT palpable nodules. No open wounds. Pt [facility] with [facility] for further dx testing and appropriate tx.   1935:  As I started to leave the exam room, pt then began to list off head to toe complaints she "has had for years but since I'm here...." I explained the  role of ED in healthcare continuum, and that we will focus on her new complaints today, not chronic/unchanged issues. Pt states the issues today then would be "to make sure I'm not dehydrated from the diarrhea" and "what the pain in my abd is." Workup ordered.   2250:  Troponin flat and EKG unchanged from previous and pt denies CP.  H/H and BUN/Cr per baseline. Pt not orthostatic on VS. Pt has tol PO well without N/V. No clear indication for admission at this time. Pt strongly encouraged to f/u with PMD regarding her multiple longstanding chronic complaints. Dx and testing d/w pt and family.  Questions answered.  Verb understanding, agreeable to d/c home with outpt f/u.     Final Clinical  Impressions(s) / ED Diagnoses   Final diagnoses:  None    ED Discharge Orders    None       Francine Graven, DO 04/06/18 2138

## 2018-04-07 LAB — URINE CULTURE: Culture: 40000 — AB

## 2018-04-08 ENCOUNTER — Telehealth: Payer: Self-pay

## 2018-04-08 NOTE — Telephone Encounter (Signed)
Post ED Visit - Positive Culture Follow-up  Culture report reviewed by antimicrobial stewardship pharmacist:  []  Elenor Quinones, Pharm.D. []  Heide Guile, Pharm.D., BCPS AQ-ID []  Parks Neptune, Pharm.D., BCPS []  Alycia Rossetti, Pharm.D., BCPS []  [place], [place].D., BCPS, AAHIVP []  Legrand Como, Pharm.D., BCPS, AAHIVP []  Salome Arnt, PharmD, BCPS []  Johnnette Gourd, PharmD, BCPS []  Hughes Better, PharmD, BCPS []  Leeroy Cha, PharmD [facility] Pharm D  Urine culture  and no further patient follow-up is required at this time.  Genia Del 04/08/2018, 10:22 AM

## 2018-04-09 DIAGNOSIS — Z23 Encounter for immunization: Secondary | ICD-10-CM | POA: Diagnosis not present

## 2018-04-09 DIAGNOSIS — G43909 Migraine, unspecified, not intractable, without status migrainosus: Secondary | ICD-10-CM | POA: Diagnosis not present

## 2018-04-09 DIAGNOSIS — N183 Chronic kidney disease, stage 3 (moderate): Secondary | ICD-10-CM | POA: Diagnosis not present

## 2018-04-09 DIAGNOSIS — C9290 Myeloid leukemia, unspecified, not having achieved remission: Secondary | ICD-10-CM | POA: Diagnosis not present

## 2018-04-09 DIAGNOSIS — I10 Essential (primary) hypertension: Secondary | ICD-10-CM | POA: Diagnosis not present

## 2018-04-13 ENCOUNTER — Telehealth (INDEPENDENT_AMBULATORY_CARE_PROVIDER_SITE_OTHER): Payer: Self-pay | Admitting: Radiology

## 2018-04-13 NOTE — Telephone Encounter (Signed)
Pt is scheduled for OV 04/27/18

## 2018-04-13 NOTE — Telephone Encounter (Signed)
Patient returning phone call, requests afternoon office visit. Call back # [phone]

## 2018-04-17 ENCOUNTER — Telehealth (INDEPENDENT_AMBULATORY_CARE_PROVIDER_SITE_OTHER): Payer: Self-pay | Admitting: *Deleted

## 2018-04-17 ENCOUNTER — Ambulatory Visit (INDEPENDENT_AMBULATORY_CARE_PROVIDER_SITE_OTHER): Payer: Medicare Other | Admitting: Internal Medicine

## 2018-04-17 ENCOUNTER — Encounter (HOSPITAL_COMMUNITY): Payer: Medicare Other

## 2018-04-17 ENCOUNTER — Telehealth (INDEPENDENT_AMBULATORY_CARE_PROVIDER_SITE_OTHER): Payer: Self-pay | Admitting: Internal Medicine

## 2018-04-17 ENCOUNTER — Ambulatory Visit (HOSPITAL_COMMUNITY): Payer: Medicare Other

## 2018-04-17 ENCOUNTER — Encounter (INDEPENDENT_AMBULATORY_CARE_PROVIDER_SITE_OTHER): Payer: Self-pay | Admitting: *Deleted

## 2018-04-17 ENCOUNTER — Other Ambulatory Visit (INDEPENDENT_AMBULATORY_CARE_PROVIDER_SITE_OTHER): Payer: Self-pay | Admitting: *Deleted

## 2018-04-17 ENCOUNTER — Encounter (HOSPITAL_COMMUNITY): Payer: Self-pay

## 2018-04-17 ENCOUNTER — Encounter (INDEPENDENT_AMBULATORY_CARE_PROVIDER_SITE_OTHER): Payer: Self-pay | Admitting: Internal Medicine

## 2018-04-17 VITALS — BP 120/80 | HR 60 | Temp 98.3°F | Ht 60.0 in | Wt 107.0 lb

## 2018-04-17 DIAGNOSIS — R1012 Left upper quadrant pain: Secondary | ICD-10-CM | POA: Insufficient documentation

## 2018-04-17 DIAGNOSIS — R1310 Dysphagia, unspecified: Secondary | ICD-10-CM | POA: Diagnosis not present

## 2018-04-17 DIAGNOSIS — K625 Hemorrhage of anus and rectum: Secondary | ICD-10-CM

## 2018-04-17 DIAGNOSIS — R109 Unspecified abdominal pain: Secondary | ICD-10-CM

## 2018-04-17 DIAGNOSIS — Z800 Family history of malignant neoplasm of digestive organs: Secondary | ICD-10-CM

## 2018-04-17 DIAGNOSIS — R1319 Other dysphagia: Secondary | ICD-10-CM

## 2018-04-17 NOTE — Telephone Encounter (Signed)
Please call patient -- she wants to discuss possibly adding EGD to TCS  Ph#: [phone]

## 2018-04-17 NOTE — Patient Instructions (Signed)
The risks of bleeding, perforation and infection were reviewed with patient.  

## 2018-04-17 NOTE — Telephone Encounter (Signed)
Lelon Frohlich, DG Esophagram.

## 2018-04-17 NOTE — Progress Notes (Addendum)
Subjective:    Patient ID: Ana Nelson, female    DOB: Mar 29, 1941, 77 y.o.   MRN: [id number]  HPI Referred by Dr. [name] for abdominal pain/rectal bleeding.  Seen in ED 04/05/2018 with  Generalized abdominal pain for several months. Also c/o intermittent watery diarrhea.  She also has had weight loss.  CT scan 04/05/2018 revealed no acute findings in the abdomen/pelvis. Stable moderate pericardial effusion.  Rt nephrectomy. She tells me today she was sent for a colonoscopy. She says she has lower abdominal pain and pain under her left shoulder. Has had pain several months. (Has been evaluated in the ED). She tells me today the pain is not bad. She has a BM daily. Her stools are brown in color. No change in her stools.  She says 2 months ago she had a black stool and when she mashed her stool, red blood ran out. She was given Cipro and Flagyl x 10 days and her symptoms resolved. She did undergo a  CT scan 03/07/2018 ordered by Dr. [name] (rectal bleeding, abdominal pain x 2 days) revealed mild wall thickening in the proximal transverse colon. May represent some focal colitis. Her appetite is okay. She thinks she has lost about 10-15 pounds in the past year. She also c/o dysphagia. Symptoms for 3 months. Eating small bites.   Family hx of colon cancer in a son 58 yrs. Usually has a BM x 1 a day. No change in her stool habits. Her last colonoscopy was in 2000 by Dr. Laural Golden.    Hx of chronic myelocytic leukemia.    CBC    Component Value Date/Time   WBC 4.4 04/05/2018 1602   RBC 3.16 (L) 04/05/2018 1602   HGB 10.5 (L) 04/05/2018 1602   HCT 32.8 (L) 04/05/2018 1602   PLT 119 (L) 04/05/2018 1602   MCV 103.8 (H) 04/05/2018 1602   MCH 33.2 04/05/2018 1602   MCHC 32.0 04/05/2018 1602   RDW 15.0 04/05/2018 1602   LYMPHSABS 0.7 12/06/2017 1151   MONOABS 0.3 12/06/2017 1151   EOSABS 0.1 12/06/2017 1151   BASOSABS 0.0 12/06/2017 1151   03/02/2018 Iron 64, Iron saturation 31, TIBC 204,  H  and H 9.6 and 28.8.   Review of Systems Past Medical History:  Diagnosis Date  . Blindness of right eye   . CML (chronic myelocytic leukemia) ([place])   . 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 Depression   . Diverticulitis   . Drooping eyelid    left eye  . GERD (gastroesophageal reflux disease)   . H/O: CML (chronic myeloid leukemia)   . History of hiatal hernia   . History of migraine headaches    "continuous migraines" per Heme/Onc note 04/2015  . Hypothyroidism   . Injury of left shoulder   . Osteoarthritis   . Osteoarthritis    right knee  . Right knee pain   . Shingles   . Wears glasses     Past Surgical History:  Procedure Laterality Date  . APPENDECTOMY    . Bone Marrow Bx & Asp    . bowel blockage surgery    . COLONOSCOPY    . right kidney removed  1980  . Rt. Cataract surgery    . TOTAL KNEE ARTHROPLASTY Right 01/18/2017  . TOTAL KNEE ARTHROPLASTY Right 01/18/2017   Procedure: RIGHT TOTAL KNEE ARTHROPLASTY;  Surgeon: Newt Minion, MD;  Location: [place];  Service: Orthopedics;  Laterality: Right;    Allergies  Allergen Reactions  . Nubain [Nalbuphine Hcl] Swelling    Tongue swells  . Perphenazine     seizures  . Vistaril [Hydroxyzine Hcl] Swelling and Other (See Comments)    TONGUE SWELLS  . Aspirin     History of Leukemia    Current Outpatient Medications on File Prior to Visit  Medication Sig Dispense Refill  . ALPRAZolam (XANAX) 1 MG tablet Take 1 mg by mouth 2 (two) times daily as needed for anxiety or sleep.     . butorphanol (STADOL) 10 MG/ML nasal spray Place 1 spray into the nose every 4 (four) hours as needed for migraine (migraines).     . imatinib (GLEEVEC) 400 MG tablet TAKE 1 TABLET BY MOUTH DAILY WITH FOOD AND A LARGE GLASS OF WATER *ALWAYS USE SECONDARY INS XL HEALTH ON THIS MEDICATION* 30 tablet 5  . lidocaine (LIDODERM) 5 % Place 1 patch onto the skin daily as needed (pain). Remove & Discard patch within 12 hours or as  directed by MD    . loperamide (IMODIUM) 2 MG capsule Take 2 mg by mouth as needed for diarrhea or loose stools.    . Multiple Vitamin (MULTIVITAMIN) tablet Take 1 tablet by mouth daily.    . NON FORMULARY Vitamin B12 sq once a month    . promethazine (PHENERGAN) 25 MG tablet Take 12.5 mg by mouth 4 (four) times daily as needed for nausea or vomiting (when experiencing a migraine).   5  . pyridOXINE (VITAMIN B-6) 100 MG tablet Take 100 mg by mouth 2 (two) times daily.    . sertraline (ZOLOFT) 50 MG tablet Take 50 mg by mouth daily.    Marland Kitchen SYNTHROID 100 MCG tablet Take 100 mcg by mouth daily before breakfast.   2   No current facility-administered medications on file prior to visit.         Objective:   Physical Exam Blood pressure 120/80, pulse 60, temperature 98.3 F (36.8 C), height 5' (1.524 m), weight 107 lb (48.5 kg). Alert and oriented. Color pale. Skin warm and dry. Oral mucosa is moist.   . Sclera anicteric, conjunctivae is pink. Thyroid not enlarged. No cervical lymphadenopathy. Lungs clear. Heart regular rate and rhythm.  Abdomen is soft. Bowel sounds are positive. No hepatomegaly. No abdominal masses felt. No tenderness.  No edema to lower extremities.  Rectal exam: No masses, guaiac negative.          Assessment & Plan:  Abdominal pain, rectal bleeding resolved,  Family hx of colon cancer in a son age 42. Colonic neopllasm needs to be ruled out. The risks of bleeding, perforation and infection were reviewed with patient. The risks of bleeding, perforation and infection were reviewed with patient. Will get an H and H in 2 weeks.  Dysphagia: Am going to get an Esophogram.

## 2018-04-17 NOTE — Addendum Note (Signed)
Addended by: Butch Penny on: 04/17/2018 04:01 PM   Modules accepted: Orders

## 2018-04-18 NOTE — Telephone Encounter (Signed)
Esophagram sch'd 04/24/18 at 945, npo 3 hrs, left detailed message for patient

## 2018-04-18 NOTE — Telephone Encounter (Signed)
I have spoke with patient. DG esophagram ordered.

## 2018-04-19 ENCOUNTER — Encounter (HOSPITAL_COMMUNITY): Payer: Self-pay

## 2018-04-19 ENCOUNTER — Encounter (HOSPITAL_COMMUNITY)
Admission: RE | Disposition: A | Discharge status: Home / Self Care | Payer: Self-pay | Source: Ambulatory Visit | Attending: Internal Medicine

## 2018-04-19 ENCOUNTER — Ambulatory Visit (HOSPITAL_COMMUNITY)
Admission: RE | Admit: 2018-04-19 | Discharge: 2018-04-19 | Disposition: A | Discharge status: Home / Self Care | Payer: Medicare Other | Source: Ambulatory Visit | Attending: Internal Medicine | Admitting: Internal Medicine

## 2018-04-19 ENCOUNTER — Other Ambulatory Visit: Payer: Self-pay

## 2018-04-19 DIAGNOSIS — K5730 Diverticulosis of large intestine without perforation or abscess without bleeding: Secondary | ICD-10-CM | POA: Diagnosis not present

## 2018-04-19 DIAGNOSIS — Z856 Personal history of leukemia: Secondary | ICD-10-CM | POA: Diagnosis not present

## 2018-04-19 DIAGNOSIS — F329 Major depressive disorder, single episode, unspecified: Secondary | ICD-10-CM | POA: Insufficient documentation

## 2018-04-19 DIAGNOSIS — K644 Residual hemorrhoidal skin tags: Secondary | ICD-10-CM | POA: Diagnosis not present

## 2018-04-19 DIAGNOSIS — Z79899 Other long term (current) drug therapy: Secondary | ICD-10-CM | POA: Insufficient documentation

## 2018-04-19 DIAGNOSIS — E039 Hypothyroidism, unspecified: Secondary | ICD-10-CM | POA: Diagnosis not present

## 2018-04-19 DIAGNOSIS — K219 Gastro-esophageal reflux disease without esophagitis: Secondary | ICD-10-CM | POA: Diagnosis not present

## 2018-04-19 DIAGNOSIS — R1012 Left upper quadrant pain: Secondary | ICD-10-CM | POA: Insufficient documentation

## 2018-04-19 DIAGNOSIS — K625 Hemorrhage of anus and rectum: Secondary | ICD-10-CM | POA: Diagnosis not present

## 2018-04-19 DIAGNOSIS — Z886 Allergy status to analgesic agent status: Secondary | ICD-10-CM | POA: Insufficient documentation

## 2018-04-19 DIAGNOSIS — H5461 Unqualified visual loss, right eye, normal vision left eye: Secondary | ICD-10-CM | POA: Diagnosis not present

## 2018-04-19 DIAGNOSIS — Z800 Family history of malignant neoplasm of digestive organs: Secondary | ICD-10-CM | POA: Insufficient documentation

## 2018-04-19 HISTORY — PX: COLONOSCOPY: SHX5424

## 2018-04-19 SURGERY — COLONOSCOPY
Anesthesia: Moderate Sedation

## 2018-04-19 MED ORDER — SODIUM CHLORIDE 0.9 % IV SOLN
INTRAVENOUS | Status: DC
  Administered 2018-04-19: 13:00:00 via INTRAVENOUS

## 2018-04-19 MED ORDER — MIDAZOLAM HCL 5 MG/5ML IJ SOLN
INTRAMUSCULAR | Status: DC | PRN
  Administered 2018-04-19 (×5): 2 mg via INTRAVENOUS

## 2018-04-19 MED ORDER — MEPERIDINE HCL 50 MG/ML IJ SOLN
INTRAMUSCULAR | Status: AC
  Filled 2018-04-19: qty 1

## 2018-04-19 MED ORDER — MEPERIDINE HCL 50 MG/ML IJ SOLN
INTRAMUSCULAR | Status: DC | PRN
  Administered 2018-04-19 (×4): 25 mg via INTRAVENOUS

## 2018-04-19 MED ORDER — MIDAZOLAM HCL 5 MG/5ML IJ SOLN
INTRAMUSCULAR | Status: AC
  Filled 2018-04-19: qty 10

## 2018-04-19 MED ORDER — STERILE WATER FOR IRRIGATION IR SOLN
Status: DC | PRN
  Administered 2018-04-19: 14:00:00

## 2018-04-19 NOTE — H&P (Signed)
Ana Nelson is an 77 y.o. female.   Chief Complaint: Patient is here for colonoscopy. HPI: Patient is 77 year-old Caucasian female who is here for diagnostic colonoscopy.  2 months ago she developed abdominal pain and rectal bleeding.  She was seen by Dr. [name].  Abdominal CT which revealed focal thickening to transverse colon.  She was felt to have focal colitis and treated with Cipro and metronidazole.  He had follow-up CT on 04/05/2018 for different symptoms/diarrhea and wall thickening in transverse colon was gone. He is not having normal bowel movements.  She states she has lost 30 ounce this year but she is gained few back. Family history significant for CRC and her son who was diagnosed in his early 48s and is doing fine 20 years later.  Past Medical History:  Diagnosis Date  . Blindness of right eye   . CML (chronic myelocytic leukemia) ([place])   . 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 Depression   . Diverticulitis   . Drooping eyelid    left eye  . GERD (gastroesophageal reflux disease)   . H/O: CML (chronic myeloid leukemia)   . History of hiatal hernia   . History of migraine headaches    "continuous migraines" per Heme/Onc note 04/2015  . Hypothyroidism   . Injury of left shoulder   . Osteoarthritis   . Osteoarthritis    right knee  . Right knee pain   . Shingles   . Wears glasses     Past Surgical History:  Procedure Laterality Date  . APPENDECTOMY    . Bone Marrow Bx & Asp    . bowel blockage surgery    . COLONOSCOPY    . right kidney removed  1980  . Rt. Cataract surgery    . TOTAL KNEE ARTHROPLASTY Right 01/18/2017  . TOTAL KNEE ARTHROPLASTY Right 01/18/2017   Procedure: RIGHT TOTAL KNEE ARTHROPLASTY;  Surgeon: Newt Minion, MD;  Location: [facility];  Service: Orthopedics;  Laterality: Right;    Family History  Problem Relation Age of Onset  . Cancer Maternal Uncle        bone  . Cancer Paternal Aunt        uterine  . Cancer Maternal  Grandmother        breast  . Cancer Maternal Grandfather        throat  . Cancer Paternal Grandmother        leukemia  . Cancer Son        colon  . Stroke Mother   . COPD Father    Social History:  reports that she has never smoked. She has never used smokeless tobacco. She reports that she does not drink alcohol or use drugs.  Allergies:  Allergies  Allergen Reactions  . Nubain [Nalbuphine Hcl] Swelling    Tongue swells  . Perphenazine     seizures  . Vistaril [Hydroxyzine Hcl] Swelling and Other (See Comments)    TONGUE SWELLS  . Aspirin     History of Leukemia    Medications Prior to Admission  Medication Sig Dispense Refill  . ALPRAZolam (XANAX) 1 MG tablet Take 1 mg by mouth 2 (two) times daily as needed for anxiety or sleep.     . butorphanol (STADOL) 10 MG/ML nasal spray Place 1 spray into the nose every 4 (four) hours as needed for migraine (migraines).     . imatinib (GLEEVEC) 400 MG tablet TAKE 1 TABLET BY MOUTH DAILY WITH FOOD  AND A LARGE GLASS OF WATER *ALWAYS USE SECONDARY INS XL HEALTH ON THIS MEDICATION* 30 tablet 5  . lidocaine (LIDODERM) 5 % Place 1 patch onto the skin daily as needed (pain). Remove & Discard patch within 12 hours or as directed by MD    . loperamide (IMODIUM) 2 MG capsule Take 2 mg by mouth as needed for diarrhea or loose stools.    . Multiple Vitamin (MULTIVITAMIN) tablet Take 1 tablet by mouth daily.    . NON FORMULARY Vitamin B12 sq once a month    . promethazine (PHENERGAN) 25 MG tablet Take 12.5 mg by mouth 4 (four) times daily as needed for nausea or vomiting (when experiencing a migraine).   5  . pyridOXINE (VITAMIN B-6) 100 MG tablet Take 100 mg by mouth 2 (two) times daily.    . sertraline (ZOLOFT) 50 MG tablet Take 50 mg by mouth daily.    Marland Kitchen SYNTHROID 100 MCG tablet Take 100 mcg by mouth daily before breakfast.   2    No results found for this or any previous visit (from the past 48 hour(s)). No results found.  ROS  Blood  pressure (!) 194/75, pulse 72, temperature 98.6 F (37 C), temperature source Oral, SpO2 100 %. Physical Exam  Constitutional:  Thin Caucasian female in NAD.  HENT:  Mouth/Throat: Oropharynx is clear and moist.  Eyes: Conjunctivae are normal. No scleral icterus.  Neck: No thyromegaly present.  Cardiovascular: Normal rate and normal heart sounds.  No murmur heard. Respiratory: Effort normal and breath sounds normal.  GI:  Diminished scaphoid with long midline scar.  Soft and nontender with organomegaly or masses.  Musculoskeletal: She exhibits no edema.  Lymphadenopathy:    She has no cervical adenopathy.  Neurological: She is alert.  Skin: Skin is warm and dry.     Assessment/Plan Rectal bleeding. History of focal colitis. Diagnostic colonoscopy  Hildred Laser, MD 04/19/2018, 1:34 PM

## 2018-04-19 NOTE — Discharge Instructions (Signed)
Resume usual medications as before. High-fiber diet. No driving for 24 hours.   Colonoscopy, Adult, Care After This sheet gives you information about how to care for yourself after your procedure. Your doctor may also give you more specific instructions. If you have problems or questions, call your doctor. Follow these instructions at home: General instructions   For the first 24 hours after the procedure: ? Do not drive or use machinery. ? Do not sign important documents. ? Do not drink alcohol. ? Do your daily activities more slowly than normal. ? Eat foods that are soft and easy to digest. ? Rest often.  Take over-the-counter or prescription medicines only as told by your doctor.  It is up to you to get the results of your procedure. Ask your doctor, or the department performing the procedure, when your results will be ready. To help cramping and bloating:  Try walking around.  Put heat on your belly (abdomen) as told by your doctor. Use a heat source that your doctor recommends, such as a moist heat pack or a heating pad. ? Put a towel between your skin and the heat source. ? Leave the heat on for 20-30 minutes. ? Remove the heat if your skin turns bright red. This is especially important if you cannot feel pain, heat, or cold. You can get burned. Eating and drinking  Drink enough fluid to keep your pee (urine) clear or pale yellow.  Return to your normal diet as told by your doctor. Avoid heavy or fried foods that are hard to digest.  Avoid drinking alcohol for as long as told by your doctor. Contact a doctor if:  You have blood in your poop (stool) 2-3 days after the procedure. Get help right away if:  You have more than a small amount of blood in your poop.  You see large clumps of tissue (blood clots) in your poop.  Your belly is swollen.  You feel sick to your stomach (nauseous).  You throw up (vomit).  You have a fever.  You have belly pain that gets  worse, and medicine does not help your pain. This information is not intended to replace advice given to you by your health care provider. Make sure you discuss any questions you have with your health care provider. Document Released: 07/30/2010 Document Revised: 03/21/2016 Document Reviewed: 03/21/2016 Elsevier Interactive Patient Education  2017 [employer].  Diverticulosis Diverticulosis is a condition that develops when small pouches (diverticula) form in the wall of the large intestine (colon). The colon is where water is absorbed and stool is formed. The pouches form when the inside layer of the colon pushes through weak spots in the outer layers of the colon. You may have a few pouches or many of them. What are the causes? The cause of this condition is not known. What increases the risk? The following factors may make you more likely to develop this condition:  Being older than age 20. Your risk for this condition increases with age. Diverticulosis is rare among people younger than age 52. By age 32, many people have it.  Eating a low-fiber diet.  Having frequent constipation.  Being overweight.  Not getting enough exercise.  Smoking.  Taking over-the-counter pain medicines, like aspirin and ibuprofen.  Having a family history of diverticulosis.  What are the signs or symptoms? In most people, there are no symptoms of this condition. If you do have symptoms, they may include:  Bloating.  Cramps in the abdomen.  Constipation or diarrhea.  Pain in the lower left side of the abdomen.  How is this diagnosed? This condition is most often diagnosed during an exam for other colon problems. Because diverticulosis usually has no symptoms, it often cannot be diagnosed independently. This condition may be diagnosed by:  Using a flexible scope to examine the colon (colonoscopy).  Taking an X-ray of the colon after dye has been put into the colon (barium enema).  Doing a CT  scan.  How is this treated? You may not need treatment for this condition if you have never developed an infection related to diverticulosis. If you have had an infection before, treatment may include:  Eating a high-fiber diet. This may include eating more fruits, vegetables, and grains.  Taking a fiber supplement.  Taking a live bacteria supplement (probiotic).  Taking medicine to relax your colon.  Taking antibiotic medicines.  Follow these instructions at home:  Drink 6-8 glasses of water or more each day to prevent constipation.  Try not to strain when you have a bowel movement.  If you have had an infection before: ? Eat more fiber as directed by your health care provider or your diet and nutrition specialist (dietitian). ? Take a fiber supplement or probiotic, if your health care provider approves.  Take over-the-counter and prescription medicines only as told by your health care provider.  If you were prescribed an antibiotic, take it as told by your health care provider. Do not stop taking the antibiotic even if you start to feel better.  Keep all follow-up visits as told by your health care provider. This is important. Contact a health care provider if:  You have pain in your abdomen.  You have bloating.  You have cramps.  You have not had a bowel movement in 3 days. Get help right away if:  Your pain gets worse.  Your bloating becomes very bad.  You have a fever or chills, and your symptoms suddenly get worse.  You vomit.  You have bowel movements that are bloody or black.  You have bleeding from your rectum. Summary  Diverticulosis is a condition that develops when small pouches (diverticula) form in the wall of the large intestine (colon).  You may have a few pouches or many of them.  This condition is most often diagnosed during an exam for other colon problems.  If you have had an infection related to diverticulosis, treatment may include  increasing the fiber in your diet, taking supplements, or taking medicines. This information is not intended to replace advice given to you by your health care provider. Make sure you discuss any questions you have with your health care provider. Document Released: 03/24/2004 Document Revised: 05/16/2016 Document Reviewed: 05/16/2016 Elsevier Interactive Patient Education  2017 [employer].  Hemorrhoids Hemorrhoids are swollen veins in and around the rectum or anus. There are two types of hemorrhoids:  Internal hemorrhoids. These occur in the veins that are just inside the rectum. They may poke through to the outside and become irritated and painful.  External hemorrhoids. These occur in the veins that are outside of the anus and can be felt as a painful swelling or hard lump near the anus.  Most hemorrhoids do not cause serious problems, and they can be managed with home treatments such as diet and lifestyle changes. If home treatments do not help your symptoms, procedures can be done to shrink or remove the hemorrhoids. What are the causes? This condition is caused by increased  pressure in the anal area. This pressure may result from various things, including:  Constipation.  Straining to have a bowel movement.  Diarrhea.  Pregnancy.  Obesity.  Sitting for long periods of time.  Heavy lifting or other activity that causes you to strain.  Anal sex.  What are the signs or symptoms? Symptoms of this condition include:  Pain.  Anal itching or irritation.  Rectal bleeding.  Leakage of stool (feces).  Anal swelling.  One or more lumps around the anus.  How is this diagnosed? This condition can often be diagnosed through a visual exam. Other exams or tests may also be done, such as:  Examination of the rectal area with a gloved hand (digital rectal exam).  Examination of the anal canal using a small tube (anoscope).  A blood test, if you have lost a significant  amount of blood.  A test to look inside the colon (sigmoidoscopy or colonoscopy).  How is this treated? This condition can usually be treated at home. However, various procedures may be done if dietary changes, lifestyle changes, and other home treatments do not help your symptoms. These procedures can help make the hemorrhoids smaller or remove them completely. Some of these procedures involve surgery, and others do not. Common procedures include:  Rubber band ligation. Rubber bands are placed at the base of the hemorrhoids to cut off the blood supply to them.  Sclerotherapy. Medicine is injected into the hemorrhoids to shrink them.  Infrared coagulation. A type of light energy is used to get rid of the hemorrhoids.  Hemorrhoidectomy surgery. The hemorrhoids are surgically removed, and the veins that supply them are tied off.  Stapled hemorrhoidopexy surgery. A circular stapling device is used to remove the hemorrhoids and use staples to cut off the blood supply to them.  Follow these instructions at home: Eating and drinking  Eat foods that have a lot of fiber in them, such as whole grains, beans, nuts, fruits, and vegetables. Ask your health care provider about taking products that have added fiber (fiber supplements).  Drink enough fluid to keep your urine clear or pale yellow. Managing pain and swelling  Take warm sitz baths for 20 minutes, 3-4 times a day to ease pain and discomfort.  If directed, apply ice to the affected area. Using ice packs between sitz baths may be helpful. ? Put ice in a plastic bag. ? Place a towel between your skin and the bag. ? Leave the ice on for 20 minutes, 2-3 times a day. General instructions  Take over-the-counter and prescription medicines only as told by your health care provider.  Use medicated creams or suppositories as told.  Exercise regularly.  Go to the bathroom when you have the urge to have a bowel movement. Do not wait.  Avoid  straining to have bowel movements.  Keep the anal area dry and clean. Use wet toilet paper or moist towelettes after a bowel movement.  Do not sit on the toilet for long periods of time. This increases blood pooling and pain. Contact a health care provider if:  You have increasing pain and swelling that are not controlled by treatment or medicine.  You have uncontrolled bleeding.  You have difficulty having a bowel movement, or you are unable to have a bowel movement.  You have pain or inflammation outside the area of the hemorrhoids. This information is not intended to replace advice given to you by your health care provider. Make sure you discuss any questions  you have with your health care provider. Document Released: 06/24/2000 Document Revised: 11/25/2015 Document Reviewed: 03/11/2015 Elsevier Interactive Patient Education  [employer].

## 2018-04-19 NOTE — Op Note (Signed)
[facility] Patient Name: Ana Nelson Procedure Date: 04/19/2018 1:40 PM MRN: [id number] Date of Birth: [birth year] Attending MD: Hildred Laser , MD CSN: [id number] Age: 77 Admit Type: Outpatient Procedure:                Colonoscopy Indications:              Rectal bleeding Providers:                Hildred Laser, MD, Jeanann Lewandowsky. Sharon Seller, RN, Nelma Rothman, Technician Referring MD:             Jasper Loser. [name], MD Medicines:                Meperidine 100 mg IV, Midazolam 10 mg IV Complications:            No immediate complications. Estimated Blood Loss:     Estimated blood loss: none. Procedure:                Pre-Anesthesia Assessment:                           - Prior to the procedure, a History and Physical                            was performed, and patient medications and                            allergies were reviewed. The patient's tolerance of                            previous anesthesia was also reviewed. The risks                            and benefits of the procedure and the sedation                            options and risks were discussed with the patient.                            All questions were answered, and informed consent                            was obtained. Prior Anticoagulants: The patient has                            taken no previous anticoagulant or antiplatelet                            agents. ASA Grade Assessment: III - A patient with                            severe systemic disease. After reviewing the risks  and benefits, the patient was deemed in                            satisfactory condition to undergo the procedure.                           After obtaining informed consent, the colonoscope                            was passed under direct vision. Throughout the                            procedure, the patient's blood pressure, pulse, and   oxygen saturations were monitored continuously. The                            [id number]-[id number] ([id number]) scope was introduced through                            the anus and advanced to the the cecum, identified                            by appendiceal orifice and ileocecal valve. The                            colonoscopy was technically difficult and complex                            due to restricted mobility of the colon and                            significant looping. Successful completion of the                            procedure was aided by increasing the dose of                            sedation medication, withdrawing and reinserting                            the scope and withdrawing the scope and replacing                            with the 'babyscope'. The patient tolerated the                            procedure well. The quality of the bowel                            preparation was good. The ileocecal valve,                            appendiceal orifice, and rectum were photographed. Scope In: 1:46:21 PM Scope Out: 2:29:21 PM Scope Withdrawal Time: 0  hours 5 minutes 49 seconds  Total Procedure Duration: 0 hours 43 minutes 0 seconds  Findings:      The perianal and digital rectal examinations were normal.      Scattered small and medium-mouthed diverticula were found in the sigmoid       colon.      The exam was otherwise normal throughout the examined colon.      External hemorrhoids were found during retroflexion. The hemorrhoids       were small. Impression:               - Diverticulosis in the sigmoid colon.                           - External hemorrhoids.                           - No specimens collected. Moderate Sedation:      Moderate (conscious) sedation was administered by the endoscopy nurse       and supervised by the endoscopist. The following parameters were       monitored: oxygen saturation, heart rate, blood pressure, CO2       capnography  and response to care. Total physician intraservice time was       50 minutes. Recommendation:           - Patient has a contact number available for                            emergencies. The signs and symptoms of potential                            delayed complications were discussed with the                            patient. Return to normal activities tomorrow.                            Written discharge instructions were provided to the                            patient.                           - High fiber diet today.                           - Continue present medications.                           - No recommendation at this time regarding repeat                            colonoscopy. Procedure Code(s):        --- Professional ---                           [phone], Colonoscopy, flexible; diagnostic, including  collection of specimen(s) by brushing or washing,                            when performed (separate procedure)                           99153, Moderate sedation; each additional 15                            minutes intraservice time                           99153, Moderate sedation; each additional 15                            minutes intraservice time                           G0500, Moderate sedation services provided by the                            same physician or other qualified health care                            professional performing a gastrointestinal                            endoscopic service that sedation supports,                            requiring the presence of an independent trained                            observer to assist in the monitoring of the                            patient's level of consciousness and physiological                            status; initial 15 minutes of intra-service time;                            patient age 22 years or older (additional time may                             be reported with [phone], as appropriate) Diagnosis Code(s):        --- Professional ---                           K64.4, Residual hemorrhoidal skin tags                           K62.5, Hemorrhage of anus and rectum                           K57.30,  Diverticulosis of large intestine without                            perforation or abscess without bleeding CPT copyright 2018 American Medical Association. All rights reserved. The codes documented in this report are preliminary and upon coder review may  be revised to meet current compliance requirements. Hildred Laser, MD Hildred Laser, MD 04/19/2018 2:35:30 PM This report has been signed electronically. Number of Addenda: 0

## 2018-04-24 ENCOUNTER — Ambulatory Visit (HOSPITAL_COMMUNITY): Payer: Medicare Other

## 2018-04-25 ENCOUNTER — Encounter (HOSPITAL_COMMUNITY): Payer: Self-pay | Admitting: Internal Medicine

## 2018-04-27 ENCOUNTER — Ambulatory Visit (INDEPENDENT_AMBULATORY_CARE_PROVIDER_SITE_OTHER): Payer: Self-pay | Admitting: Physical Medicine and Rehabilitation

## 2018-04-30 DIAGNOSIS — I480 Paroxysmal atrial fibrillation: Secondary | ICD-10-CM | POA: Diagnosis not present

## 2018-04-30 DIAGNOSIS — C9211 Chronic myeloid leukemia, BCR/ABL-positive, in remission: Secondary | ICD-10-CM | POA: Diagnosis not present

## 2018-04-30 DIAGNOSIS — D34 Benign neoplasm of thyroid gland: Secondary | ICD-10-CM | POA: Diagnosis not present

## 2018-04-30 DIAGNOSIS — E890 Postprocedural hypothyroidism: Secondary | ICD-10-CM | POA: Diagnosis not present

## 2018-05-04 ENCOUNTER — Encounter (INDEPENDENT_AMBULATORY_CARE_PROVIDER_SITE_OTHER): Payer: Self-pay | Admitting: Physical Medicine and Rehabilitation

## 2018-05-04 ENCOUNTER — Telehealth (INDEPENDENT_AMBULATORY_CARE_PROVIDER_SITE_OTHER): Payer: Self-pay | Admitting: Physical Medicine and Rehabilitation

## 2018-05-04 ENCOUNTER — Ambulatory Visit (INDEPENDENT_AMBULATORY_CARE_PROVIDER_SITE_OTHER): Payer: Medicare Other | Admitting: Physical Medicine and Rehabilitation

## 2018-05-04 ENCOUNTER — Telehealth (INDEPENDENT_AMBULATORY_CARE_PROVIDER_SITE_OTHER): Payer: Self-pay | Admitting: *Deleted

## 2018-05-04 VITALS — BP 103/52 | HR 49 | Ht 60.0 in | Wt 108.8 lb

## 2018-05-04 DIAGNOSIS — G894 Chronic pain syndrome: Secondary | ICD-10-CM

## 2018-05-04 DIAGNOSIS — M47816 Spondylosis without myelopathy or radiculopathy, lumbar region: Secondary | ICD-10-CM

## 2018-05-04 DIAGNOSIS — G8929 Other chronic pain: Secondary | ICD-10-CM

## 2018-05-04 DIAGNOSIS — M5416 Radiculopathy, lumbar region: Secondary | ICD-10-CM

## 2018-05-04 DIAGNOSIS — M48062 Spinal stenosis, lumbar region with neurogenic claudication: Secondary | ICD-10-CM

## 2018-05-04 DIAGNOSIS — M545 Low back pain: Secondary | ICD-10-CM

## 2018-05-04 MED ORDER — GABAPENTIN 100 MG PO CAPS: 100.0000 mg | ORAL_CAPSULE | Freq: Every day | ORAL | 1 refills | Status: AC

## 2018-05-04 NOTE — Progress Notes (Signed)
.  Numeric Pain Rating Scale and Functional Assessment Average Pain 10 Pain Right Now 7 My pain is constant and sharp Pain is worse with: sitting and some activites Pain improves with: rest   In the last MONTH (on 0-10 scale) has pain interfered with the following?  1. General activity like being  able to carry out your everyday physical activities such as walking, climbing stairs, carrying groceries, or moving a chair?  Rating(6)  2. Relation with others like being able to carry out your usual social activities and roles such as  activities at home, at work and in your community. Rating(6)  3. Enjoyment of life such that you have  been bothered by emotional problems such as feeling anxious, depressed or irritable?  Rating(5)

## 2018-05-04 NOTE — Telephone Encounter (Signed)
done

## 2018-05-04 NOTE — Telephone Encounter (Signed)
Called pt and advised.  

## 2018-05-04 NOTE — Telephone Encounter (Signed)
Medicare is primary insurance for pt and no PA is needed for procedure.

## 2018-05-15 ENCOUNTER — Ambulatory Visit (INDEPENDENT_AMBULATORY_CARE_PROVIDER_SITE_OTHER): Payer: Medicare Other | Admitting: Physical Medicine and Rehabilitation

## 2018-05-15 ENCOUNTER — Encounter (INDEPENDENT_AMBULATORY_CARE_PROVIDER_SITE_OTHER): Payer: Self-pay | Admitting: Physical Medicine and Rehabilitation

## 2018-05-15 ENCOUNTER — Ambulatory Visit (INDEPENDENT_AMBULATORY_CARE_PROVIDER_SITE_OTHER): Payer: Self-pay

## 2018-05-15 VITALS — BP 126/57 | HR 57 | Temp 98.7°F

## 2018-05-15 DIAGNOSIS — M5416 Radiculopathy, lumbar region: Secondary | ICD-10-CM

## 2018-05-15 DIAGNOSIS — M48062 Spinal stenosis, lumbar region with neurogenic claudication: Secondary | ICD-10-CM

## 2018-05-15 MED ORDER — METHYLPREDNISOLONE ACETATE 80 MG/ML IJ SUSP
80.0000 mg | Freq: Once | INTRAMUSCULAR | Status: AC
  Administered 2018-05-15: 80 mg

## 2018-05-15 NOTE — Progress Notes (Signed)
.     Numeric Pain Rating Scale and Functional Assessment Average Pain 10   In the last MONTH (on 0-10 scale) has pain interfered with the following?  1. General activity like being  able to carry out your everyday physical activities such as walking, climbing stairs, carrying groceries, or moving a chair?  Rating(7)   +Driver, -BT, -Dye Allergies.  

## 2018-05-15 NOTE — Patient Instructions (Signed)

## 2018-05-23 ENCOUNTER — Inpatient Hospital Stay (HOSPITAL_COMMUNITY): Payer: Medicare Other | Attending: Hematology

## 2018-05-23 DIAGNOSIS — C9210 Chronic myeloid leukemia, BCR/ABL-positive, not having achieved remission: Secondary | ICD-10-CM

## 2018-05-23 DIAGNOSIS — N189 Chronic kidney disease, unspecified: Secondary | ICD-10-CM | POA: Diagnosis not present

## 2018-05-23 DIAGNOSIS — R21 Rash and other nonspecific skin eruption: Secondary | ICD-10-CM | POA: Diagnosis not present

## 2018-05-23 DIAGNOSIS — R7989 Other specified abnormal findings of blood chemistry: Secondary | ICD-10-CM

## 2018-05-23 DIAGNOSIS — Z79899 Other long term (current) drug therapy: Secondary | ICD-10-CM | POA: Diagnosis not present

## 2018-05-23 LAB — CBC WITH DIFFERENTIAL/PLATELET
Abs Immature Granulocytes: 0.02 10*3/uL (ref 0.00–0.07)
BASOS ABS: 0 10*3/uL (ref 0.0–0.1)
BASOS PCT: 1 %
EOS ABS: 0.1 10*3/uL (ref 0.0–0.5)
EOS PCT: 2 %
HCT: 34.6 % — ABNORMAL LOW (ref 36.0–46.0)
Hemoglobin: 10.8 g/dL — ABNORMAL LOW (ref 12.0–15.0)
Immature Granulocytes: 0 %
LYMPHS PCT: 19 %
Lymphs Abs: 1 10*3/uL (ref 0.7–4.0)
MCH: 33.3 pg (ref 26.0–34.0)
MCHC: 31.2 g/dL (ref 30.0–36.0)
MCV: 106.8 fL — ABNORMAL HIGH (ref 80.0–100.0)
Monocytes Absolute: 0.5 10*3/uL (ref 0.1–1.0)
Monocytes Relative: 10 %
NRBC: 0 % (ref 0.0–0.2)
Neutro Abs: 3.6 10*3/uL (ref 1.7–7.7)
Neutrophils Relative %: 68 %
PLATELETS: 121 10*3/uL — AB (ref 150–400)
RBC: 3.24 MIL/uL — AB (ref 3.87–5.11)
RDW: 16.1 % — AB (ref 11.5–15.5)
WBC: 5.3 10*3/uL (ref 4.0–10.5)

## 2018-05-23 LAB — COMPREHENSIVE METABOLIC PANEL
ALBUMIN: 3.9 g/dL (ref 3.5–5.0)
ALT: 36 U/L (ref 0–44)
AST: 64 U/L — ABNORMAL HIGH (ref 15–41)
Alkaline Phosphatase: 66 U/L (ref 38–126)
Anion gap: 6 (ref 5–15)
BUN: 30 mg/dL — ABNORMAL HIGH (ref 8–23)
CO2: 27 mmol/L (ref 22–32)
Calcium: 9 mg/dL (ref 8.9–10.3)
Chloride: 108 mmol/L (ref 98–111)
Creatinine, Ser: 1.81 mg/dL — ABNORMAL HIGH (ref 0.44–1.00)
GFR calc non Af Amer: 26 mL/min — ABNORMAL LOW (ref >60)
GFR, EST AFRICAN AMERICAN: 30 mL/min — AB (ref >60)
GLUCOSE: 77 mg/dL (ref 70–99)
POTASSIUM: 4.3 mmol/L (ref 3.5–5.1)
SODIUM: 141 mmol/L (ref 135–145)
Total Bilirubin: 0.5 mg/dL (ref 0.3–1.2)
Total Protein: 6.5 g/dL (ref 6.5–8.1)

## 2018-05-23 LAB — IRON AND TIBC
IRON: 100 ug/dL (ref 28–170)
Saturation Ratios: 34 % — ABNORMAL HIGH (ref 10.4–31.8)
TIBC: 298 ug/dL (ref 250–450)
UIBC: 198 ug/dL

## 2018-05-23 LAB — LACTATE DEHYDROGENASE: LDH: 287 U/L — ABNORMAL HIGH (ref 98–192)

## 2018-05-23 LAB — FERRITIN: FERRITIN: 91 ng/mL (ref 11–307)

## 2018-05-24 DIAGNOSIS — C9290 Myeloid leukemia, unspecified, not having achieved remission: Secondary | ICD-10-CM | POA: Diagnosis not present

## 2018-05-24 DIAGNOSIS — I10 Essential (primary) hypertension: Secondary | ICD-10-CM | POA: Diagnosis not present

## 2018-05-24 DIAGNOSIS — K210 Gastro-esophageal reflux disease with esophagitis: Secondary | ICD-10-CM | POA: Diagnosis not present

## 2018-05-24 DIAGNOSIS — E039 Hypothyroidism, unspecified: Secondary | ICD-10-CM | POA: Diagnosis not present

## 2018-05-28 NOTE — Procedures (Signed)
Lumbar Epidural Steroid Injection - Interlaminar Approach with Fluoroscopic Guidance  Patient: Ana Nelson      Date of Birth: 11-21-1940 MRN: [id number] PCP: Sinda Du, MD      Visit Date: 05/15/2018   Universal Protocol:     Consent Given By: the patient  Position: PRONE  Additional Comments: Vital signs were monitored before and after the procedure. Patient was prepped and draped in the usual sterile fashion. The correct patient, procedure, and site was verified.   Injection Procedure Details:  Procedure Site One Meds Administered:  Meds ordered this encounter  Medications  . methylPREDNISolone acetate (DEPO-MEDROL) injection 80 mg     Laterality: Right  Location/Site:  L4-L5  Needle size: 20 G  Needle type: Tuohy  Needle Placement: Paramedian epidural  Findings:   -Comments: Excellent flow of contrast into the epidural space.  We had difficulty getting into the interlaminar space at L3-4 which was a combination of bony narrowing and arthritis as well as probably positioning of the patient.  Despite repositioning in different angles just could not obtain good flow of contrast and loss of resistance.  We did move to the L4-5 space at the patient's consent and this went exceedingly well and easy.  Procedure Details: Using a paramedian approach from the side mentioned above, the region overlying the inferior lamina was localized under fluoroscopic visualization and the soft tissues overlying this structure were infiltrated with 4 ml. of 1% Lidocaine without Epinephrine. The Tuohy needle was inserted into the epidural space using a paramedian approach.   The epidural space was localized using loss of resistance along with lateral and bi-planar fluoroscopic views.  After negative aspirate for air, blood, and CSF, a 2 ml. volume of Isovue-250 was injected into the epidural space and the flow of contrast was observed. Radiographs were obtained for documentation  purposes.    The injectate was administered into the level noted above.   Additional Comments:  The patient tolerated the procedure well Dressing: Band-Aid    Post-procedure details: Patient was observed during the procedure. Post-procedure instructions were reviewed.  Patient left the clinic in stable condition.

## 2018-05-28 NOTE — Progress Notes (Signed)
Ana Nelson - 77 y.o. female MRN [id number]  Date of birth: [birth year]  Office Visit Note: Visit Date: 05/15/2018 PCP: Sinda Du, MD Referred by: Sinda Du, MD  Subjective: Chief Complaint  Patient presents with  . Lower Back - Pain  . Right Thigh - Pain   HPI:  Ana Nelson is a 77 y.o. female who comes in today For planned right L3-4 interlaminar epidural steroid injection for chronic worsening low back and buttock pain more right than left thigh pain.  As noted in the procedure note below we really could not enter the L3-4 space do to bony arthritis and narrowing.  We were able to enter at L4-5 without difficulty and a complete L4-5 interlaminar injection and this was discussed with the patient.  In the future would look at transforaminal approach versus repeat MRI of the lumbar spine.  ROS Otherwise per HPI.  Assessment & Plan: Visit Diagnoses:  1. Lumbar radiculopathy   2. Spinal stenosis of lumbar region with neurogenic claudication     Plan: No additional findings.   Meds & Orders:  Meds ordered this encounter  Medications  . methylPREDNISolone acetate (DEPO-MEDROL) injection 80 mg    Orders Placed This Encounter  Procedures  . XR C-ARM NO REPORT  . Epidural Steroid injection    Follow-up: No follow-ups on file.   Procedures: No procedures performed  Lumbar Epidural Steroid Injection - Interlaminar Approach with Fluoroscopic Guidance  Patient: Ana Nelson      Date of Birth: 02-Oct-1940 MRN: [id number] PCP: Sinda Du, MD      Visit Date: 05/15/2018   Universal Protocol:     Consent Given By: the patient  Position: PRONE  Additional Comments: Vital signs were monitored before and after the procedure. Patient was prepped and draped in the usual sterile fashion. The correct patient, procedure, and site was verified.   Injection Procedure Details:  Procedure Site One Meds Administered:  Meds ordered this encounter    Medications  . methylPREDNISolone acetate (DEPO-MEDROL) injection 80 mg     Laterality: Right  Location/Site:  L4-L5  Needle size: 20 G  Needle type: Tuohy  Needle Placement: Paramedian epidural  Findings:   -Comments: Excellent flow of contrast into the epidural space.  We had difficulty getting into the interlaminar space at L3-4 which was a combination of bony narrowing and arthritis as well as probably positioning of the patient.  Despite repositioning in different angles just could not obtain good flow of contrast and loss of resistance.  We did move to the L4-5 space at the patient's consent and this went exceedingly well and easy.  Procedure Details: Using a paramedian approach from the side mentioned above, the region overlying the inferior lamina was localized under fluoroscopic visualization and the soft tissues overlying this structure were infiltrated with 4 ml. of 1% Lidocaine without Epinephrine. The Tuohy needle was inserted into the epidural space using a paramedian approach.   The epidural space was localized using loss of resistance along with lateral and bi-planar fluoroscopic views.  After negative aspirate for air, blood, and CSF, a 2 ml. volume of Isovue-250 was injected into the epidural space and the flow of contrast was observed. Radiographs were obtained for documentation purposes.    The injectate was administered into the level noted above.   Additional Comments:  The patient tolerated the procedure well Dressing: Band-Aid    Post-procedure details: Patient was observed during the procedure. Post-procedure instructions were reviewed.  Patient  left the clinic in stable condition.     Clinical History: MRI LUMBAR SPINE WITHOUT CONTRAST  TECHNIQUE: Multiplanar, multisequence MR imaging of the lumbar spine was performed. No intravenous contrast was administered.  COMPARISON:  Lumbar spine radiographs 10/01/2013.  FINDINGS: Segmentation:  The numbering convention used for this exam termed L5-S1 as the last intervertebral disc space. Five lumbar type vertebral bodies identified on prior radiographs.  Alignment: Mild dextroconvex curve of the lumbar spine with the apex at L3. Grade I retrolisthesis of L1 on L2 is present measuring 2 mm. This is secondary to collapse of the disc space. Similar retrolisthesis of L 2 on L3.  Vertebrae: Severe degenerative endplate changes are present at T12-L1. Degenerative endplate changes are also present at other levels. No compression fracture or aggressive osseous lesions.  Conus medullaris: Normal termination at T12-L1.  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 Otherwise the paraspinal soft tissues are within normal limits.  Disc levels:  Severe multilevel lumbar disc degeneration. The degenerative disc disease is most severe at T12-L1.  T11-T12:  Sagittal imaging only.  Disc desiccation but no stenosis.  T12-L1: Severe disc degeneration with severe degenerative endplate changes. Vacuum disc. There is a circumferential disc bulge. Broad-based posterior disc bulge component produces mild central stenosis. Facet arthrosis contributes to the central stenosis. There is mild bilateral foraminal stenosis.  L1-L2: LEFT facet arthrosis. Disc degeneration with circumferential disc bulging. Broad-based disc bulging produces mild central stenosis. There is LEFT subarticular stenosis secondary to bulging disc and facet arthrosis. Mild symmetric bilateral foraminal stenosis.  L2-L3: Moderate multifactorial central stenosis. The disc is degenerated with circumferential disc bulging. Central disc extrusion with mild caudal migration of disc material. There is bilateral RIGHT-greater-than-LEFT subarticular stenosis potentially affecting the descending L3 nerves, greater on the RIGHT than LEFT. Mild to moderate LEFT and mild RIGHT  foraminal stenosis is multifactorial. Severe bilateral facet arthrosis. LEFT facet arthritis is present with periarticular edema and bone marrow edema.  L3-L4: Moderate multifactorial central stenosis. The disc is severely degenerated with broad-based posterior bulging. RIGHT neural foramen is patent. Mild to moderate LEFT foraminal stenosis is multifactorial. Disc bulging and facet arthrosis contributes to the stenosis. There is narrowing of both subarticular zones but no neural compression.  L4-L5: Moderate multifactorial central stenosis. The disc is severely degenerated with circumferential disc bulging. Broad-based RIGHT eccentric disc bulging is present. Moderate RIGHT foraminal stenosis secondary to severe facet arthrosis and bulging disc. The LEFT neural foramen appears adequately patent. RIGHT-greater-than-LEFT subarticular stenosis potentially affecting the descending L5 nerves.  L5-S1: Disc desiccation. Severe RIGHT facet arthrosis with mild LEFT facet arthrosis. Posterior ligamentum flavum redundancy. Minimal central stenosis. Subarticular zones are both narrowed due to facet arthrosis and ligamentum flavum redundancy but there is no neural compression. Both neural foramina are patent.  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 There is multilevel subarticular and foraminal stenosis that could be implicated in bilateral lower extremity radiculopathy.   Electronically Signed   By: Dereck Ligas M.D.   On: 08/21/2014 16:54     Objective:  VS:  HT:    WT:   BMI:     BP:(!) 126/57  HR:(!) 57bpm  TEMP:98.7 F (37.1 C)(Oral)  RESP:  Physical Exam  Ortho Exam Imaging: No results found.

## 2018-05-28 NOTE — Progress Notes (Signed)
Ana Nelson - 77 y.o. female MRN [id number]  Date of birth: 13-May-1941  Office Visit Note: Visit Date: 05/04/2018 PCP: Sinda Du, MD Referred by: Sinda Du, MD  Subjective: Chief Complaint  Patient presents with  . Lower Back - Pain   HPI: Ana Nelson is a 77 y.o. female who comes in today For evaluation of chronic long-term recalcitrant back and radicular hip and buttock pain.  I have seen the patient many years ago and completed epidural injection with decent relief temporarily of her symptoms.  She has a long complicated history of chronic pain management with opioid management.  She reports years of worsening lower back pain worse with standing and ambulating.  She also reports worsening with lifting but also sometimes sitting for a long time but it is when she first starts to get up.  She reports constant sharp pain that refers to the bilateral buttocks.  She really does not report x-ray anything down the legs or legs.  She gets some relief with rest no relief with medications although she continues to use those.  She is a very frail-appearing 77 year old.  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  She has had physical therapy in the past and tries to stay active.  She has not had that recently and does not really feel like she needs to go to physical therapy.  She has had a knee replaced and is followed by orthopedics in our office.  She denies any focal weakness.  She is had no bowel or bladder changes.  No changes in gait or fevers chills or night sweats.  In general she feels weakness.  MRI was completed in 2016 and reviewed below.  This was reviewed with the patient today with spine models.  She has multilevel severe arthritis with very mild curvature and moderate multifactorial stenosis at L3-4 and L4-5 central canal and lateral recess and foraminal.  Review of Systems  Constitutional: Positive for malaise/fatigue. Negative for chills, fever and weight loss.    HENT: Negative for hearing loss and sinus pain.   Eyes: Negative for blurred vision, double vision and photophobia.  Respiratory: Negative for cough and shortness of breath.   Cardiovascular: Negative for chest pain, palpitations and leg swelling.  Gastrointestinal: Negative for abdominal pain, nausea and vomiting.  Genitourinary: Negative for flank pain.  Musculoskeletal: Positive for back pain and joint pain. Negative for myalgias.  Skin: Negative for itching and rash.  Neurological: Negative for tremors, focal weakness and weakness.  Endo/Heme/Allergies: Negative.   Psychiatric/Behavioral: Negative for depression.  All other systems reviewed and are negative.  Otherwise per HPI.  Assessment & Plan: Visit Diagnoses:  1. Spinal stenosis of lumbar region with neurogenic claudication   2. Lumbar radiculopathy   3. Spondylosis without myelopathy or radiculopathy, lumbar region   4. Chronic bilateral low back pain without sciatica   5. Chronic pain syndrome     Plan: Findings:  Chronic recalcitrant longtime history of lumbar pain and generalized chronic pain.  She is failed multiple opioid medications and other secondary medications.  She has been using Lidoderm patches and other medication without relief.  She has not been taking any gabapentin or other adjunctive treatment.  She had epidural injection some years ago with some relief.  She has multilevel degenerative disease of the spine multiple levels of stenosis both lateral recess foraminal and central canal.  I am going to start a small amount of gabapentin and try to taper this up.  We  are also going to get her in for lumbar interlaminar epidural steroid injection.  Depending on relief would look at updating MRI versus trying a transforaminal approach.  She could indeed have some pain is facet mediated as well.  Complicated picture including history of leukemia.    Meds & Orders:  Meds ordered this encounter  Medications  .  gabapentin (NEURONTIN) 100 MG capsule    Sig: Take 1 capsule (100 mg total) by mouth at bedtime. May increase as tolerated up tp 3 capsules at night    Dispense:  90 capsule    Refill:  1   No orders of the defined types were placed in this encounter.   Follow-up: Return for L3-4 interlaminar esi.   Procedures: No procedures performed  No notes on file   Clinical History: MRI LUMBAR SPINE WITHOUT CONTRAST  TECHNIQUE: Multiplanar, multisequence MR imaging of the lumbar spine was performed. No intravenous contrast was administered.  COMPARISON:  Lumbar spine radiographs 10/01/2013.  FINDINGS: Segmentation: The numbering convention used for this exam termed L5-S1 as the last intervertebral disc space. Five lumbar type vertebral bodies identified on prior radiographs.  Alignment: Mild dextroconvex curve of the lumbar spine with the apex at L3. Grade I retrolisthesis of L1 on L2 is present measuring 2 mm. This is secondary to collapse of the disc space. Similar retrolisthesis of L 2 on L3.  Vertebrae: Severe degenerative endplate changes are present at T12-L1. Degenerative endplate changes are also present at other levels. No compression fracture or aggressive osseous lesions.  Conus medullaris: Normal termination at T12-L1.  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 Otherwise the paraspinal soft tissues are within normal limits.  Disc levels:  Severe multilevel lumbar disc degeneration. The degenerative disc disease is most severe at T12-L1.  T11-T12:  Sagittal imaging only.  Disc desiccation but no stenosis.  T12-L1: Severe disc degeneration with severe degenerative endplate changes. Vacuum disc. There is a circumferential disc bulge. Broad-based posterior disc bulge component produces mild central stenosis. Facet arthrosis contributes to the central stenosis. There is mild bilateral foraminal stenosis.  L1-L2:  LEFT facet arthrosis. Disc degeneration with circumferential disc bulging. Broad-based disc bulging produces mild central stenosis. There is LEFT subarticular stenosis secondary to bulging disc and facet arthrosis. Mild symmetric bilateral foraminal stenosis.  L2-L3: Moderate multifactorial central stenosis. The disc is degenerated with circumferential disc bulging. Central disc extrusion with mild caudal migration of disc material. There is bilateral RIGHT-greater-than-LEFT subarticular stenosis potentially affecting the descending L3 nerves, greater on the RIGHT than LEFT. Mild to moderate LEFT and mild RIGHT foraminal stenosis is multifactorial. Severe bilateral facet arthrosis. LEFT facet arthritis is present with periarticular edema and bone marrow edema.  L3-L4: Moderate multifactorial central stenosis. The disc is severely degenerated with broad-based posterior bulging. RIGHT neural foramen is patent. Mild to moderate LEFT foraminal stenosis is multifactorial. Disc bulging and facet arthrosis contributes to the stenosis. There is narrowing of both subarticular zones but no neural compression.  L4-L5: Moderate multifactorial central stenosis. The disc is severely degenerated with circumferential disc bulging. Broad-based RIGHT eccentric disc bulging is present. Moderate RIGHT foraminal stenosis secondary to severe facet arthrosis and bulging disc. The LEFT neural foramen appears adequately patent. RIGHT-greater-than-LEFT subarticular stenosis potentially affecting the descending L5 nerves.  L5-S1: Disc desiccation. Severe RIGHT facet arthrosis with mild LEFT facet arthrosis. Posterior ligamentum flavum redundancy. Minimal central stenosis. Subarticular zones are both narrowed due to facet arthrosis and ligamentum flavum redundancy but there  is no neural compression. Both neural foramina are patent.  IMPRESSION: Severe multilevel lumbar degenerative disc and facet  disease detailed above. There is multilevel subarticular and foraminal stenosis that could be implicated in bilateral lower extremity radiculopathy.   Electronically Signed   By: Dereck Ligas M.D.   On: 08/21/2014 16:54   She reports that she has never smoked. She has never used smokeless tobacco. No results for input(s): HGBA1C, LABURIC in the last 8760 hours.  Objective:  VS:  HT:5' (152.4 cm)   WT:108 lb 12.8 oz (49.4 kg)  BMI:21.25    BP:(!) 103/52  HR:(!) 49bpm  TEMP: ( )  RESP:100 % Physical Exam  Constitutional: She is oriented to person, place, and time. She appears well-developed. No distress.  Frail-appearing  HENT:  Head: Normocephalic and atraumatic.  Nose: Nose normal.  Mouth/Throat: Oropharynx is clear and moist.  Eyes: Pupils are equal, round, and reactive to light. Conjunctivae are normal.  Neck: Neck supple. No JVD present. No tracheal deviation present.  Cardiovascular: Regular rhythm and intact distal pulses.  Pulmonary/Chest: Effort normal. No respiratory distress.  Abdominal: She exhibits no distension. There is no guarding.  Musculoskeletal: She exhibits no edema.  Examination of the lumbar spine shows pain with extension and facet joint loading.  She has no pain over the greater trochanters but some tenderness of the paraspinal musculature.  No pain over the PSIS to palpation.  She has no pain with hip rotation.  There is some stiffness here bilaterally.  She has good distal strength without clonus.  Neurological: She is alert and oriented to person, place, and time. She exhibits normal muscle tone. Coordination normal.  Skin: Skin is warm. No rash noted. No erythema.  Psychiatric: She has a normal mood and affect. Her behavior is normal.  Nursing note and vitals reviewed.   Ortho Exam Imaging: No results found.  Past Medical/Family/Surgical/Social History: Medications & Allergies reviewed per EMR, new medications updated. Patient Active Problem  List   Diagnosis Date Noted  . Family hx of colon cancer 04/17/2018  . Left upper quadrant pain 04/17/2018  . Rectal bleeding 04/17/2018  . 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 Preoperative clearance 10/27/2016  . Chronic right shoulder pain 07/22/2016  . Unilateral primary osteoarthritis, right knee 06/15/2016  . CML (chronic myelocytic leukemia) ([place]) 05/04/2012   Past Medical History:  Diagnosis Date  . Blindness of right eye   . CML (chronic myelocytic leukemia) ([place])   . CML (chronic myelocytic leukemia) ([place]) 05/04/2012  . Depression   . Diverticulitis   . Drooping eyelid    left eye  . GERD (gastroesophageal reflux disease)   . H/O: CML (chronic myeloid leukemia)   . History of hiatal hernia   . History of migraine headaches    "continuous migraines" per Heme/Onc note 04/2015  . Hypothyroidism   . Injury of left shoulder   . Osteoarthritis   . Osteoarthritis    right knee  . Right knee pain   . Shingles   . Wears glasses    Family History  Problem Relation Age of Onset  . Cancer Maternal Uncle        bone  . Cancer Paternal Aunt        uterine  . Cancer Maternal Grandmother        breast  . Cancer Maternal Grandfather        throat  . Cancer Paternal Grandmother        leukemia  .  Cancer Son        colon  . Stroke Mother   . COPD Father    Past Surgical History:  Procedure Laterality Date  . APPENDECTOMY    . Bone Marrow Bx & Asp    . bowel blockage surgery    . COLONOSCOPY    . COLONOSCOPY N/A 04/19/2018   Procedure: COLONOSCOPY;  Surgeon: Rogene Houston, MD;  Location: [facility];  Service: Endoscopy;  Laterality: N/A;  . right kidney removed  1980  . Rt. Cataract surgery    . TOTAL KNEE ARTHROPLASTY Right 01/18/2017  . TOTAL KNEE ARTHROPLASTY Right 01/18/2017   Procedure: RIGHT TOTAL KNEE ARTHROPLASTY;  Surgeon: Newt Minion, MD;  Location: [facility];  Service: Orthopedics;  Laterality: Right;   Social History   Occupational  History  . Not on file  Tobacco Use  . Smoking status: Never Smoker  . Smokeless tobacco: Never Used  Substance and Sexual Activity  . Alcohol use: No  . Drug use: No  . Sexual activity: Never

## 2018-05-30 ENCOUNTER — Encounter (HOSPITAL_COMMUNITY): Payer: Self-pay | Admitting: Hematology

## 2018-05-30 ENCOUNTER — Inpatient Hospital Stay (HOSPITAL_BASED_OUTPATIENT_CLINIC_OR_DEPARTMENT_OTHER): Payer: Medicare Other | Admitting: Hematology

## 2018-05-30 VITALS — BP 167/65 | HR 59 | Temp 98.1°F | Resp 18 | Wt 113.7 lb

## 2018-05-30 DIAGNOSIS — R21 Rash and other nonspecific skin eruption: Secondary | ICD-10-CM

## 2018-05-30 DIAGNOSIS — N189 Chronic kidney disease, unspecified: Secondary | ICD-10-CM

## 2018-05-30 DIAGNOSIS — R7989 Other specified abnormal findings of blood chemistry: Secondary | ICD-10-CM

## 2018-05-30 DIAGNOSIS — Z79899 Other long term (current) drug therapy: Secondary | ICD-10-CM

## 2018-05-30 DIAGNOSIS — C9210 Chronic myeloid leukemia, BCR/ABL-positive, not having achieved remission: Secondary | ICD-10-CM

## 2018-05-30 LAB — BCR-ABL1, CML/ALL, PCR, QUANT

## 2018-05-30 LAB — HEMOCHROMATOSIS DNA-PCR(C282Y,H63D)

## 2018-05-30 NOTE — Patient Instructions (Signed)
[place] at [facility] Discharge Instructions  Follow up in 6 months with labs one week prior.    Thank you for choosing [place] at [facility] to provide your oncology and hematology care.  To afford each patient quality time with our provider, please arrive at least 15 minutes before your scheduled appointment time.   If you have a lab appointment with the [facility] please come in thru the  Main Entrance and check in at the main information desk  You need to re-schedule your appointment should you arrive 10 or more minutes late.  We strive to give you quality time with our providers, and arriving late affects you and other patients whose appointments are after yours.  Also, if you no show three or more times for appointments you may be dismissed from the clinic at the providers discretion.     Again, thank you for choosing [facility].  Our hope is that these requests will decrease the amount of time that you wait before being seen by our physicians.       _____________________________________________________________  Should you have questions after your visit to [facility], please contact our office at [phone] between the hours of 8:00 a.m. and 4:30 p.m.  Voicemails left after 4:00 p.m. will not be returned until the following business day.  For prescription refill requests, have your pharmacy contact our office and allow 72 hours.    Cancer Center Support Programs:   > Cancer Support Group  2nd Tuesday of the month 1pm-2pm, Journey Room

## 2018-05-30 NOTE — Assessment & Plan Note (Signed)
1.  Chronic myeloid leukemia: -Originally diagnosed in 2000.  She was started on Gleevec 400 mg daily. - She has been on complete molecular response last few years. -She is tolerating Gleevec very well at full dose of 400 mg daily. -Today's physical examination did not reveal any hepatosplenomegaly.  She denies any B symptoms.  She has some rash on her abdomen which appears to be petechial.  She reports that the rash develops when she scratches. - I have reviewed results of BCR/ABL by quantitative PCR dated 05/23/2018 which was negative. -She will come back in 6 months for follow-up.  We will plan to repeat her blood work.  2.  Elevated ferritin: -There was a concern for elevated ferritin at last visit.  I have reviewed ferritin which is 91.  Percent saturation was 34. - No further testing for hemochromatosis needed at this time.  3.  CKD: -Creatinine is stable around 1.8.

## 2018-05-30 NOTE — Progress Notes (Signed)
Ana Nelson,  [id number]   CLINIC:  Medical Oncology/Hematology  PCP:  Sinda Du, MD [address] [place] [address] [phone]   REASON FOR VISIT: Follow-up for chronic myelocytic leukemia  CURRENT THERAPY: (Gleevac) imatinib   INTERVAL HISTORY:  Ana Nelson 77 y.o. female returns for routine follow-up for chronic myelocytic leukemia. She is here today with her husband and doing well tolerating her oral treatment. She does complain of intermittent constipation and diarrhea but it is manageable with OTC medication. She denies any new pains. Denies any nausea or vomiting. Denies any fevers or recent infections. She reports her appetite and energy level at 100%. She was recently loosing weight and fatigued all the time until she cut her synthroid in half and she has started gaining weight and returned to her normal activity.     REVIEW OF SYSTEMS:  Review of Systems  HENT:   Positive for trouble swallowing.   Gastrointestinal: Positive for constipation and diarrhea.  All other systems reviewed and are negative.    PAST MEDICAL/SURGICAL HISTORY:  Past Medical History:  Diagnosis Date  . Blindness of right eye   . CML (chronic myelocytic leukemia) ([place])   . CML (chronic myelocytic leukemia) ([place]) 05/04/2012  . Depression   . Diverticulitis   . Drooping eyelid    left eye  . GERD (gastroesophageal reflux disease)   . H/O: CML (chronic myeloid leukemia)   . History of hiatal hernia   . History of migraine headaches    "continuous migraines" per Heme/Onc note 04/2015  . Hypothyroidism   . Injury of left shoulder   . Osteoarthritis   . Osteoarthritis    right knee  . Right knee pain   . Shingles   . Wears glasses    Past Surgical History:  Procedure Laterality Date  . APPENDECTOMY    . Bone Marrow Bx & Asp    . bowel blockage surgery    . COLONOSCOPY    . COLONOSCOPY N/A 04/19/2018   Procedure:  COLONOSCOPY;  Surgeon: Rogene Houston, MD;  Location: [facility];  Service: Endoscopy;  Laterality: N/A;  . right kidney removed  1980  . Rt. Cataract surgery    . TOTAL KNEE ARTHROPLASTY Right 01/18/2017  . TOTAL KNEE ARTHROPLASTY Right 01/18/2017   Procedure: RIGHT TOTAL KNEE ARTHROPLASTY;  Surgeon: Newt Minion, MD;  Location: [facility];  Service: Orthopedics;  Laterality: Right;     SOCIAL HISTORY:  Social History   Socioeconomic History  . Marital status: Married    Spouse name: Not on file  . Number of children: Not on file  . Years of education: Not on file  . Highest education level: Not on file  Occupational History  . Not on file  Social Needs  . Financial resource strain: Not on file  . Food insecurity:    Worry: Not on file    Inability: Not on file  . Transportation needs:    Medical: Not on file    Non-medical: Not on file  Tobacco Use  . Smoking status: Never Smoker  . Smokeless tobacco: Never Used  Substance and Sexual Activity  . Alcohol use: No  . Drug use: No  . Sexual activity: Never  Lifestyle  . Physical activity:    Days per week: Not on file    Minutes per session: Not on file  . Stress: Not on file  Relationships  . Social connections:  Talks on phone: Not on file    Gets together: Not on file    Attends religious service: Not on file    Active member of club or organization: Not on file    Attends meetings of clubs or organizations: Not on file    Relationship status: Not on file  . Intimate partner violence:    Fear of current or ex partner: Not on file    Emotionally abused: Not on file    Physically abused: Not on file    Forced sexual activity: Not on file  Other Topics Concern  . Not on file  Social History Narrative  . Not on file    FAMILY HISTORY:  Family History  Problem Relation Age of Onset  . Cancer Maternal Uncle        bone  . Cancer Paternal Aunt        uterine  . Cancer Maternal Grandmother        breast    . Cancer Maternal Grandfather        throat  . Cancer Paternal Grandmother        leukemia  . Cancer Son        colon  . Stroke Mother   . COPD Father     CURRENT MEDICATIONS:  Outpatient Encounter Medications as of 05/30/2018  Medication Sig Note  . ALPRAZolam (XANAX) 1 MG tablet Take 1 mg by mouth 2 (two) times daily as needed for anxiety or sleep.    . butorphanol (STADOL) 10 MG/ML nasal spray Place 1 spray into the nose every 4 (four) hours as needed for migraine (migraines).    . cyanocobalamin (,VITAMIN B-12,) 1000 MCG/ML injection INJECT 1ML EVERY MONTH   . gabapentin (NEURONTIN) 100 MG capsule Take 1 capsule (100 mg total) by mouth at bedtime. May increase as tolerated up tp 3 capsules at night (Patient not taking: Reported on 05/30/2018) 05/30/2018: Stopped taking caused her right hand to swell  . imatinib (GLEEVEC) 400 MG tablet TAKE 1 TABLET BY MOUTH DAILY WITH FOOD AND A LARGE GLASS OF WATER *ALWAYS USE SECONDARY INS XL HEALTH ON THIS MEDICATION*   . lidocaine (LIDODERM) 5 % Place 1 patch onto the skin daily as needed (pain). Remove & Discard patch within 12 hours or as directed by MD   . loperamide (IMODIUM) 2 MG capsule Take 2 mg by mouth as needed for diarrhea or loose stools.   . Multiple Vitamin (MULTIVITAMIN) tablet Take 1 tablet by mouth daily.   . NON FORMULARY Vitamin B12 sq once a month   . potassium chloride (K-DUR,KLOR-CON) 10 MEQ tablet    . promethazine (PHENERGAN) 25 MG tablet Take 12.5 mg by mouth 4 (four) times daily as needed for nausea or vomiting (when experiencing a migraine).    . pyridOXINE (VITAMIN B-6) 100 MG tablet Take 100 mg by mouth 2 (two) times daily.   . sertraline (ZOLOFT) 50 MG tablet Take 50 mg by mouth daily.   Marland Kitchen SYNTHROID 100 MCG tablet Take 100 mcg by mouth daily before breakfast.     No facility-administered encounter medications on file as of 05/30/2018.     ALLERGIES:  Allergies  Allergen Reactions  . Nubain [Nalbuphine Hcl]  Swelling    Tongue swells  . Perphenazine     seizures  . Vistaril [Hydroxyzine Hcl] Swelling and Other (See Comments)    TONGUE SWELLS  . Aspirin     History of Leukemia     PHYSICAL EXAM:  ECOG Performance status: 1  Vitals:   05/30/18 0930  BP: (!) 167/65  Pulse: (!) 59  Resp: 18  Temp: 98.1 F (36.7 C)  SpO2: 100%   Filed Weights   05/30/18 0930  Weight: 113 lb 11.2 oz (51.6 kg)    Physical Exam  Constitutional: She is oriented to person, place, and time. She appears well-developed and well-nourished.  Cardiovascular: Normal rate, regular rhythm and normal heart sounds.  Pulmonary/Chest: Effort normal and breath sounds normal.  Musculoskeletal: Normal range of motion.  Neurological: She is alert and oriented to person, place, and time.  Skin: Skin is warm and dry.  Psychiatric: She has a normal mood and affect. Her behavior is normal. Judgment and thought content normal.  Abdomen: No splenomegaly.   LABORATORY DATA:  I have reviewed the labs as listed.  CBC    Component Value Date/Time   WBC 5.3 05/23/2018 1329   RBC 3.24 (L) 05/23/2018 1329   HGB 10.8 (L) 05/23/2018 1329   HCT 34.6 (L) 05/23/2018 1329   PLT 121 (L) 05/23/2018 1329   MCV 106.8 (H) 05/23/2018 1329   MCH 33.3 05/23/2018 1329   MCHC 31.2 05/23/2018 1329   RDW 16.1 (H) 05/23/2018 1329   LYMPHSABS 1.0 05/23/2018 1329   MONOABS 0.5 05/23/2018 1329   EOSABS 0.1 05/23/2018 1329   BASOSABS 0.0 05/23/2018 1329   CMP Latest Ref Rng & Units 05/23/2018 04/05/2018 12/06/2017  Glucose 70 - 99 mg/dL 77 95 85  BUN 8 - 23 mg/dL 30(H) 17 21(H)  Creatinine 0.44 - 1.00 mg/dL 1.81(H) 1.39(H) 1.83(H)  Sodium 135 - 145 mmol/L 141 137 136  Potassium 3.5 - 5.1 mmol/L 4.3 4.1 4.0  Chloride 98 - 111 mmol/L 108 100 103  CO2 22 - 32 mmol/L 27 29 27   Calcium 8.9 - 10.3 mg/dL 9.0 9.1 8.2(L)  Total Protein 6.5 - 8.1 g/dL 6.5 7.0 5.7(L)  Total Bilirubin 0.3 - 1.2 mg/dL 0.5 0.5 0.6  Alkaline Phos 38 - 126 U/L 66  78 62  AST 15 - 41 U/L 64(H) 49(H) 50(H)  ALT 0 - 44 U/L 36 32 20         ASSESSMENT & PLAN:   CML (chronic myelocytic leukemia) 1.  Chronic myeloid leukemia: -Originally diagnosed in 2000.  She was started on Gleevec 400 mg daily. - She has been on complete molecular response last few years. -She is tolerating Gleevec very well at full dose of 400 mg daily. -Today's physical examination did not reveal any hepatosplenomegaly.  She denies any B symptoms.  She has some rash on her abdomen which appears to be petechial.  She reports that the rash develops when she scratches. - I have reviewed results of BCR/ABL by quantitative PCR dated 05/23/2018 which was negative. -She will come back in 6 months for follow-up.  We will plan to repeat her blood work.  2.  Elevated ferritin: -There was a concern for elevated ferritin at last visit.  I have reviewed ferritin which is 91.  Percent saturation was 34. - No further testing for hemochromatosis needed at this time.  3.  CKD: -Creatinine is stable around 1.8.      Orders placed this encounter:  Orders Placed This Encounter  Procedures  . CBC with Differential/Platelet  . Comprehensive metabolic panel  . BCR-ABL1, CML/ALL, PCR, QUANT  . Lactate dehydrogenase      Derek Jack, MD [place] [phone]

## 2018-05-31 ENCOUNTER — Other Ambulatory Visit (HOSPITAL_COMMUNITY): Payer: Self-pay | Admitting: Internal Medicine

## 2018-05-31 DIAGNOSIS — C9210 Chronic myeloid leukemia, BCR/ABL-positive, not having achieved remission: Secondary | ICD-10-CM

## 2018-06-12 ENCOUNTER — Other Ambulatory Visit (HOSPITAL_COMMUNITY): Payer: Medicare Other

## 2018-06-13 ENCOUNTER — Other Ambulatory Visit (HOSPITAL_COMMUNITY): Payer: Self-pay | Admitting: Internal Medicine

## 2018-06-13 ENCOUNTER — Telehealth (HOSPITAL_COMMUNITY): Payer: Self-pay | Admitting: Emergency Medicine

## 2018-06-13 DIAGNOSIS — C9210 Chronic myeloid leukemia, BCR/ABL-positive, not having achieved remission: Secondary | ICD-10-CM

## 2018-06-13 NOTE — Telephone Encounter (Signed)
Left message on pts VM letting her know that her BCR-ABL was within normal limits and to call if she had any questions.

## 2018-06-19 ENCOUNTER — Ambulatory Visit (HOSPITAL_COMMUNITY): Payer: Medicare Other | Admitting: Hematology

## 2018-06-25 DIAGNOSIS — E559 Vitamin D deficiency, unspecified: Secondary | ICD-10-CM | POA: Diagnosis not present

## 2018-06-25 DIAGNOSIS — R809 Proteinuria, unspecified: Secondary | ICD-10-CM | POA: Diagnosis not present

## 2018-06-25 DIAGNOSIS — Z79899 Other long term (current) drug therapy: Secondary | ICD-10-CM | POA: Diagnosis not present

## 2018-06-25 DIAGNOSIS — D509 Iron deficiency anemia, unspecified: Secondary | ICD-10-CM | POA: Diagnosis not present

## 2018-06-25 DIAGNOSIS — N183 Chronic kidney disease, stage 3 (moderate): Secondary | ICD-10-CM | POA: Diagnosis not present

## 2018-06-26 ENCOUNTER — Telehealth (HOSPITAL_COMMUNITY): Payer: Self-pay | Admitting: *Deleted

## 2018-07-09 DIAGNOSIS — R809 Proteinuria, unspecified: Secondary | ICD-10-CM | POA: Diagnosis not present

## 2018-07-09 DIAGNOSIS — N183 Chronic kidney disease, stage 3 (moderate): Secondary | ICD-10-CM | POA: Diagnosis not present

## 2018-07-09 DIAGNOSIS — E870 Hyperosmolality and hypernatremia: Secondary | ICD-10-CM | POA: Diagnosis not present

## 2018-07-10 ENCOUNTER — Ambulatory Visit (HOSPITAL_COMMUNITY): Payer: Medicare Other

## 2018-07-10 ENCOUNTER — Encounter (HOSPITAL_COMMUNITY): Payer: Medicare Other

## 2018-07-10 ENCOUNTER — Encounter (HOSPITAL_COMMUNITY): Payer: Self-pay

## 2018-07-10 ENCOUNTER — Other Ambulatory Visit (HOSPITAL_COMMUNITY): Payer: Medicare Other

## 2018-07-12 ENCOUNTER — Encounter (HOSPITAL_COMMUNITY): Payer: Medicare Other

## 2018-07-17 ENCOUNTER — Encounter (HOSPITAL_COMMUNITY): Payer: Medicare Other

## 2018-07-20 DIAGNOSIS — D34 Benign neoplasm of thyroid gland: Secondary | ICD-10-CM | POA: Diagnosis not present

## 2018-07-20 DIAGNOSIS — C9211 Chronic myeloid leukemia, BCR/ABL-positive, in remission: Secondary | ICD-10-CM | POA: Diagnosis not present

## 2018-07-20 DIAGNOSIS — R1310 Dysphagia, unspecified: Secondary | ICD-10-CM | POA: Diagnosis not present

## 2018-07-20 DIAGNOSIS — I480 Paroxysmal atrial fibrillation: Secondary | ICD-10-CM | POA: Diagnosis not present

## 2018-07-20 DIAGNOSIS — E890 Postprocedural hypothyroidism: Secondary | ICD-10-CM | POA: Diagnosis not present

## 2018-07-24 ENCOUNTER — Ambulatory Visit (HOSPITAL_COMMUNITY)
Admission: RE | Admit: 2018-07-24 | Discharge: 2018-07-24 | Disposition: A | Discharge status: Home / Self Care | Payer: Medicare Other | Source: Ambulatory Visit | Attending: Pulmonary Disease | Admitting: Pulmonary Disease

## 2018-07-24 DIAGNOSIS — IMO0002 Reserved for concepts with insufficient information to code with codable children: Secondary | ICD-10-CM

## 2018-07-24 DIAGNOSIS — R922 Inconclusive mammogram: Secondary | ICD-10-CM | POA: Diagnosis not present

## 2018-07-24 DIAGNOSIS — Z09 Encounter for follow-up examination after completed treatment for conditions other than malignant neoplasm: Secondary | ICD-10-CM

## 2018-07-24 DIAGNOSIS — N6321 Unspecified lump in the left breast, upper outer quadrant: Secondary | ICD-10-CM | POA: Diagnosis not present

## 2018-07-24 DIAGNOSIS — R229 Localized swelling, mass and lump, unspecified: Principal | ICD-10-CM

## 2018-07-26 DIAGNOSIS — I480 Paroxysmal atrial fibrillation: Secondary | ICD-10-CM | POA: Diagnosis not present

## 2018-07-26 DIAGNOSIS — E890 Postprocedural hypothyroidism: Secondary | ICD-10-CM | POA: Diagnosis not present

## 2018-07-26 DIAGNOSIS — C9211 Chronic myeloid leukemia, BCR/ABL-positive, in remission: Secondary | ICD-10-CM | POA: Diagnosis not present

## 2018-07-26 DIAGNOSIS — D34 Benign neoplasm of thyroid gland: Secondary | ICD-10-CM | POA: Diagnosis not present

## 2018-07-26 DIAGNOSIS — E0520 Thyrotoxicosis with toxic multinodular goiter without thyrotoxic crisis or storm: Secondary | ICD-10-CM | POA: Diagnosis not present

## 2018-07-30 ENCOUNTER — Other Ambulatory Visit (HOSPITAL_COMMUNITY): Payer: Self-pay | Admitting: Endocrinology

## 2018-07-30 ENCOUNTER — Other Ambulatory Visit: Payer: Self-pay | Admitting: Endocrinology

## 2018-07-30 DIAGNOSIS — R1310 Dysphagia, unspecified: Secondary | ICD-10-CM

## 2018-08-01 ENCOUNTER — Telehealth (INDEPENDENT_AMBULATORY_CARE_PROVIDER_SITE_OTHER): Payer: Self-pay | Admitting: Physical Medicine and Rehabilitation

## 2018-08-01 NOTE — Telephone Encounter (Signed)
Yes if helped 

## 2018-08-02 NOTE — Telephone Encounter (Signed)
Notification or Prior Authorization is not required for the requested services  This [employer] Medicare Advantage members plan does not currently require a prior authorization for [id number]  Decision ID #:[id number]

## 2018-08-02 NOTE — Telephone Encounter (Signed)
Can you call to schedule 

## 2018-08-02 NOTE — Telephone Encounter (Signed)
Is auth needed? Scheduled for 2/6 with driver and no blood thinners.

## 2018-08-09 ENCOUNTER — Ambulatory Visit (HOSPITAL_COMMUNITY): Payer: Medicare Other

## 2018-08-10 ENCOUNTER — Encounter (HOSPITAL_COMMUNITY): Payer: Self-pay

## 2018-08-10 ENCOUNTER — Other Ambulatory Visit (HOSPITAL_COMMUNITY): Payer: Medicare Other

## 2018-08-10 ENCOUNTER — Ambulatory Visit (HOSPITAL_COMMUNITY): Payer: Medicare Other

## 2018-08-10 DIAGNOSIS — E890 Postprocedural hypothyroidism: Secondary | ICD-10-CM | POA: Diagnosis not present

## 2018-08-10 DIAGNOSIS — E0520 Thyrotoxicosis with toxic multinodular goiter without thyrotoxic crisis or storm: Secondary | ICD-10-CM | POA: Diagnosis not present

## 2018-08-10 DIAGNOSIS — D34 Benign neoplasm of thyroid gland: Secondary | ICD-10-CM | POA: Diagnosis not present

## 2018-08-10 DIAGNOSIS — C9211 Chronic myeloid leukemia, BCR/ABL-positive, in remission: Secondary | ICD-10-CM | POA: Diagnosis not present

## 2018-08-10 DIAGNOSIS — I480 Paroxysmal atrial fibrillation: Secondary | ICD-10-CM | POA: Diagnosis not present

## 2018-08-16 ENCOUNTER — Ambulatory Visit (INDEPENDENT_AMBULATORY_CARE_PROVIDER_SITE_OTHER): Payer: Self-pay

## 2018-08-16 ENCOUNTER — Ambulatory Visit (INDEPENDENT_AMBULATORY_CARE_PROVIDER_SITE_OTHER): Payer: Medicare Other | Admitting: Physical Medicine and Rehabilitation

## 2018-08-16 ENCOUNTER — Encounter (INDEPENDENT_AMBULATORY_CARE_PROVIDER_SITE_OTHER): Payer: Self-pay | Admitting: Physical Medicine and Rehabilitation

## 2018-08-16 VITALS — BP 138/65 | HR 60 | Temp 98.9°F

## 2018-08-16 DIAGNOSIS — M5416 Radiculopathy, lumbar region: Secondary | ICD-10-CM

## 2018-08-16 MED ORDER — METHYLPREDNISOLONE ACETATE 80 MG/ML IJ SUSP
80.0000 mg | Freq: Once | INTRAMUSCULAR | Status: AC
  Administered 2018-08-16: 80 mg

## 2018-08-16 NOTE — Progress Notes (Signed)
 .  Numeric Pain Rating Scale and Functional Assessment Average Pain 7   In the last MONTH (on 0-10 scale) has pain interfered with the following?  1. General activity like being  able to carry out your everyday physical activities such as walking, climbing stairs, carrying groceries, or moving a chair?  Rating(6)   +Driver, -BT, -Dye Allergies.  

## 2018-08-17 NOTE — Progress Notes (Signed)
Ana Nelson - 78 y.o. female MRN [id number]  Date of birth: Sep 24, 1940  Office Visit Note: Visit Date: 08/16/2018 PCP: Sinda Du, MD Referred by: Sinda Du, MD  Subjective: Chief Complaint  Patient presents with  . Lower Back - Pain  . Right Leg - Pain   HPI:  Ana Nelson is a 78 y.o. female who comes in today For planned repeat lumbar epidural injection.  We last saw the patient in November and completed right L4-5 interlaminar epidural steroid injection with really good relief of her symptoms up until just recently.  She has had no new trauma or falls or other problems.  She is getting pain in the low back and right hip and leg with standing and walking consistent with arthritis and stenosis.  MRI evidence of moderate multifactorial stenosis at L3-4 and L4-5 with multilevel facet arthropathy.  She also suffers from chronic leukemia.  She has had chronic pain for a number of years.  Her primary care physician provides her with butorphanol as well as breakthrough oxycodone.  Epidural injection did give her quite a bit of relief so we will repeat this today.  Exam is nonfocal patient ambulates without aid.  ROS Otherwise per HPI.  Assessment & Plan: Visit Diagnoses:  1. Lumbar radiculopathy     Plan: No additional findings.   Meds & Orders:  Meds ordered this encounter  Medications  . methylPREDNISolone acetate (DEPO-MEDROL) injection 80 mg    Orders Placed This Encounter  Procedures  . XR C-ARM NO REPORT  . Epidural Steroid injection    Follow-up: Return if symptoms worsen or fail to improve.   Procedures: No procedures performed  Lumbar Epidural Steroid Injection - Interlaminar Approach with Fluoroscopic Guidance  Patient: Ana Nelson      Date of Birth: 11-Dec-1940 MRN: [id number] PCP: Sinda Du, MD      Visit Date: 08/16/2018   Universal Protocol:     Consent Given By: the patient  Position: PRONE  Additional Comments: Vital  signs were monitored before and after the procedure. Patient was prepped and draped in the usual sterile fashion. The correct patient, procedure, and site was verified.   Injection Procedure Details:  Procedure Site One Meds Administered:  Meds ordered this encounter  Medications  . methylPREDNISolone acetate (DEPO-MEDROL) injection 80 mg     Laterality: Right  Location/Site:  L4-L5  Needle size: 20 G  Needle type: Tuohy  Needle Placement: Paramedian epidural  Findings:   -Comments: Excellent flow of contrast into the epidural space.  Procedure Details: Using a paramedian approach from the side mentioned above, the region overlying the inferior lamina was localized under fluoroscopic visualization and the soft tissues overlying this structure were infiltrated with 4 ml. of 1% Lidocaine without Epinephrine. The Tuohy needle was inserted into the epidural space using a paramedian approach.   The epidural space was localized using loss of resistance along with lateral and bi-planar fluoroscopic views.  After negative aspirate for air, blood, and CSF, a 2 ml. volume of Isovue-250 was injected into the epidural space and the flow of contrast was observed. Radiographs were obtained for documentation purposes.    The injectate was administered into the level noted above.   Additional Comments:  The patient tolerated the procedure well Dressing: Band-Aid    Post-procedure details: Patient was observed during the procedure. Post-procedure instructions were reviewed.  Patient left the clinic in stable condition.   Clinical History: MRI LUMBAR SPINE WITHOUT CONTRAST  TECHNIQUE: Multiplanar, multisequence MR imaging of the lumbar spine was performed. No intravenous contrast was administered.  COMPARISON:  Lumbar spine radiographs 10/01/2013.  FINDINGS: Segmentation: The numbering convention used for this exam termed L5-S1 as the last intervertebral disc space. Five  lumbar type vertebral bodies identified on prior radiographs.  Alignment: Mild dextroconvex curve of the lumbar spine with the apex at L3. Grade I retrolisthesis of L1 on L2 is present measuring 2 mm. This is secondary to collapse of the disc space. Similar retrolisthesis of L 2 on L3.  Vertebrae: Severe degenerative endplate changes are present at T12-L1. Degenerative endplate changes are also present at other levels. No compression fracture or aggressive osseous lesions.  Conus medullaris: Normal termination at T12-L1.  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 Otherwise the paraspinal soft tissues are within normal limits.  Disc levels:  Severe multilevel lumbar disc degeneration. The degenerative disc disease is most severe at T12-L1.  T11-T12:  Sagittal imaging only.  Disc desiccation but no stenosis.  T12-L1: Severe disc degeneration with severe degenerative endplate changes. Vacuum disc. There is a circumferential disc bulge. Broad-based posterior disc bulge component produces mild central stenosis. Facet arthrosis contributes to the central stenosis. There is mild bilateral foraminal stenosis.  L1-L2: LEFT facet arthrosis. Disc degeneration with circumferential disc bulging. Broad-based disc bulging produces mild central stenosis. There is LEFT subarticular stenosis secondary to bulging disc and facet arthrosis. Mild symmetric bilateral foraminal stenosis.  L2-L3: Moderate multifactorial central stenosis. The disc is degenerated with circumferential disc bulging. Central disc extrusion with mild caudal migration of disc material. There is bilateral RIGHT-greater-than-LEFT subarticular stenosis potentially affecting the descending L3 nerves, greater on the RIGHT than LEFT. Mild to moderate LEFT and mild RIGHT foraminal stenosis is multifactorial. Severe bilateral facet arthrosis. LEFT facet arthritis is present  with periarticular edema and bone marrow edema.  L3-L4: Moderate multifactorial central stenosis. The disc is severely degenerated with broad-based posterior bulging. RIGHT neural foramen is patent. Mild to moderate LEFT foraminal stenosis is multifactorial. Disc bulging and facet arthrosis contributes to the stenosis. There is narrowing of both subarticular zones but no neural compression.  L4-L5: Moderate multifactorial central stenosis. The disc is severely degenerated with circumferential disc bulging. Broad-based RIGHT eccentric disc bulging is present. Moderate RIGHT foraminal stenosis secondary to severe facet arthrosis and bulging disc. The LEFT neural foramen appears adequately patent. RIGHT-greater-than-LEFT subarticular stenosis potentially affecting the descending L5 nerves.  L5-S1: Disc desiccation. Severe RIGHT facet arthrosis with mild LEFT facet arthrosis. Posterior ligamentum flavum redundancy. Minimal central stenosis. Subarticular zones are both narrowed due to facet arthrosis and ligamentum flavum redundancy but there is no neural compression. Both neural foramina are patent.  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 There is multilevel subarticular and foraminal stenosis that could be implicated in bilateral lower extremity radiculopathy.   Electronically Signed   By: Dereck Ligas M.D.   On: 08/21/2014 16:54     Objective:  VS:  HT:    WT:   BMI:     BP:138/65  HR:60bpm  TEMP:98.9 F (37.2 C)(Oral)  RESP:  Physical Exam  Ortho Exam Imaging: Xr C-arm No Report  Result Date: 08/16/2018 Please see Notes tab for imaging impression.

## 2018-08-17 NOTE — Procedures (Signed)
Lumbar Epidural Steroid Injection - Interlaminar Approach with Fluoroscopic Guidance  Patient: Ana Nelson      Date of Birth: [birth year] MRN: [id number] PCP: Sinda Du, MD      Visit Date: 08/16/2018   Universal Protocol:     Consent Given By: the patient  Position: PRONE  Additional Comments: Vital signs were monitored before and after the procedure. Patient was prepped and draped in the usual sterile fashion. The correct patient, procedure, and site was verified.   Injection Procedure Details:  Procedure Site One Meds Administered:  Meds ordered this encounter  Medications  . methylPREDNISolone acetate (DEPO-MEDROL) injection 80 mg     Laterality: Right  Location/Site:  L4-L5  Needle size: 20 G  Needle type: Tuohy  Needle Placement: Paramedian epidural  Findings:   -Comments: Excellent flow of contrast into the epidural space.  Procedure Details: Using a paramedian approach from the side mentioned above, the region overlying the inferior lamina was localized under fluoroscopic visualization and the soft tissues overlying this structure were infiltrated with 4 ml. of 1% Lidocaine without Epinephrine. The Tuohy needle was inserted into the epidural space using a paramedian approach.   The epidural space was localized using loss of resistance along with lateral and bi-planar fluoroscopic views.  After negative aspirate for air, blood, and CSF, a 2 ml. volume of Isovue-250 was injected into the epidural space and the flow of contrast was observed. Radiographs were obtained for documentation purposes.    The injectate was administered into the level noted above.   Additional Comments:  The patient tolerated the procedure well Dressing: Band-Aid    Post-procedure details: Patient was observed during the procedure. Post-procedure instructions were reviewed.  Patient left the clinic in stable condition.

## 2018-08-24 DIAGNOSIS — C9290 Myeloid leukemia, unspecified, not having achieved remission: Secondary | ICD-10-CM | POA: Diagnosis not present

## 2018-08-24 DIAGNOSIS — E785 Hyperlipidemia, unspecified: Secondary | ICD-10-CM | POA: Diagnosis not present

## 2018-08-24 DIAGNOSIS — I10 Essential (primary) hypertension: Secondary | ICD-10-CM | POA: Diagnosis not present

## 2018-08-24 DIAGNOSIS — N19 Unspecified kidney failure: Secondary | ICD-10-CM | POA: Diagnosis not present

## 2018-08-28 DIAGNOSIS — N183 Chronic kidney disease, stage 3 (moderate): Secondary | ICD-10-CM | POA: Diagnosis not present

## 2018-08-28 DIAGNOSIS — G43909 Migraine, unspecified, not intractable, without status migrainosus: Secondary | ICD-10-CM | POA: Diagnosis not present

## 2018-08-28 DIAGNOSIS — I129 Hypertensive chronic kidney disease with stage 1 through stage 4 chronic kidney disease, or unspecified chronic kidney disease: Secondary | ICD-10-CM | POA: Diagnosis not present

## 2018-08-28 DIAGNOSIS — C9290 Myeloid leukemia, unspecified, not having achieved remission: Secondary | ICD-10-CM | POA: Diagnosis not present

## 2018-09-06 ENCOUNTER — Telehealth (INDEPENDENT_AMBULATORY_CARE_PROVIDER_SITE_OTHER): Payer: Self-pay | Admitting: Physical Medicine and Rehabilitation

## 2018-09-06 NOTE — Telephone Encounter (Signed)
If no help at all then right vs bilateral TF esi - L3 or L4

## 2018-09-06 NOTE — Telephone Encounter (Signed)
Notification or Prior Authorization is not required for the requested services  This [employer]vantage members plan does not currently require a prior authorization for [id number]  Decision ID #:[id number]

## 2018-09-06 NOTE — Telephone Encounter (Signed)
Is auth needed for this code? Patient is scheduled for 3/12.

## 2018-09-12 ENCOUNTER — Ambulatory Visit: Payer: Medicare Other | Admitting: Neurology

## 2018-09-16 ENCOUNTER — Inpatient Hospital Stay (HOSPITAL_COMMUNITY)
Admission: EM | Admit: 2018-09-16 | Discharge: 2018-10-10 | DRG: 064 | Disposition: E | Payer: Medicare Other | Attending: Pulmonary Disease | Admitting: Pulmonary Disease

## 2018-09-16 ENCOUNTER — Encounter (HOSPITAL_COMMUNITY): Payer: Self-pay | Admitting: *Deleted

## 2018-09-16 ENCOUNTER — Emergency Department (HOSPITAL_COMMUNITY): Payer: Medicare Other

## 2018-09-16 DIAGNOSIS — Z79899 Other long term (current) drug therapy: Secondary | ICD-10-CM

## 2018-09-16 DIAGNOSIS — K449 Diaphragmatic hernia without obstruction or gangrene: Secondary | ICD-10-CM | POA: Diagnosis not present

## 2018-09-16 DIAGNOSIS — I610 Nontraumatic intracerebral hemorrhage in hemisphere, subcortical: Principal | ICD-10-CM | POA: Diagnosis present

## 2018-09-16 DIAGNOSIS — Z4659 Encounter for fitting and adjustment of other gastrointestinal appliance and device: Secondary | ICD-10-CM

## 2018-09-16 DIAGNOSIS — I129 Hypertensive chronic kidney disease with stage 1 through stage 4 chronic kidney disease, or unspecified chronic kidney disease: Secondary | ICD-10-CM | POA: Diagnosis present

## 2018-09-16 DIAGNOSIS — G43909 Migraine, unspecified, not intractable, without status migrainosus: Secondary | ICD-10-CM | POA: Diagnosis not present

## 2018-09-16 DIAGNOSIS — K219 Gastro-esophageal reflux disease without esophagitis: Secondary | ICD-10-CM | POA: Diagnosis present

## 2018-09-16 DIAGNOSIS — I619 Nontraumatic intracerebral hemorrhage, unspecified: Secondary | ICD-10-CM | POA: Diagnosis not present

## 2018-09-16 DIAGNOSIS — Z888 Allergy status to other drugs, medicaments and biological substances status: Secondary | ICD-10-CM | POA: Diagnosis not present

## 2018-09-16 DIAGNOSIS — G935 Compression of brain: Secondary | ICD-10-CM | POA: Diagnosis not present

## 2018-09-16 DIAGNOSIS — C9210 Chronic myeloid leukemia, BCR/ABL-positive, not having achieved remission: Secondary | ICD-10-CM | POA: Diagnosis not present

## 2018-09-16 DIAGNOSIS — G919 Hydrocephalus, unspecified: Secondary | ICD-10-CM | POA: Diagnosis present

## 2018-09-16 DIAGNOSIS — Z66 Do not resuscitate: Secondary | ICD-10-CM | POA: Diagnosis not present

## 2018-09-16 DIAGNOSIS — H5461 Unqualified visual loss, right eye, normal vision left eye: Secondary | ICD-10-CM | POA: Diagnosis not present

## 2018-09-16 DIAGNOSIS — Z79890 Hormone replacement therapy (postmenopausal): Secondary | ICD-10-CM

## 2018-09-16 DIAGNOSIS — R400 Somnolence: Secondary | ICD-10-CM | POA: Diagnosis not present

## 2018-09-16 DIAGNOSIS — Z515 Encounter for palliative care: Secondary | ICD-10-CM

## 2018-09-16 DIAGNOSIS — R0689 Other abnormalities of breathing: Secondary | ICD-10-CM | POA: Diagnosis not present

## 2018-09-16 DIAGNOSIS — I615 Nontraumatic intracerebral hemorrhage, intraventricular: Secondary | ICD-10-CM | POA: Diagnosis not present

## 2018-09-16 DIAGNOSIS — F419 Anxiety disorder, unspecified: Secondary | ICD-10-CM | POA: Diagnosis present

## 2018-09-16 DIAGNOSIS — I4891 Unspecified atrial fibrillation: Secondary | ICD-10-CM | POA: Diagnosis not present

## 2018-09-16 DIAGNOSIS — I612 Nontraumatic intracerebral hemorrhage in hemisphere, unspecified: Secondary | ICD-10-CM | POA: Diagnosis not present

## 2018-09-16 DIAGNOSIS — N183 Chronic kidney disease, stage 3 (moderate): Secondary | ICD-10-CM | POA: Diagnosis present

## 2018-09-16 DIAGNOSIS — I629 Nontraumatic intracranial hemorrhage, unspecified: Secondary | ICD-10-CM

## 2018-09-16 DIAGNOSIS — Z7189 Other specified counseling: Secondary | ICD-10-CM

## 2018-09-16 DIAGNOSIS — R4020 Unspecified coma: Secondary | ICD-10-CM | POA: Diagnosis not present

## 2018-09-16 DIAGNOSIS — E039 Hypothyroidism, unspecified: Secondary | ICD-10-CM | POA: Diagnosis present

## 2018-09-16 DIAGNOSIS — Z886 Allergy status to analgesic agent status: Secondary | ICD-10-CM | POA: Diagnosis not present

## 2018-09-16 DIAGNOSIS — Z4682 Encounter for fitting and adjustment of non-vascular catheter: Secondary | ICD-10-CM | POA: Diagnosis not present

## 2018-09-16 DIAGNOSIS — W19XXXA Unspecified fall, initial encounter: Secondary | ICD-10-CM | POA: Diagnosis present

## 2018-09-16 DIAGNOSIS — Z01818 Encounter for other preprocedural examination: Secondary | ICD-10-CM

## 2018-09-16 DIAGNOSIS — I10 Essential (primary) hypertension: Secondary | ICD-10-CM | POA: Diagnosis not present

## 2018-09-16 DIAGNOSIS — R404 Transient alteration of awareness: Secondary | ICD-10-CM | POA: Diagnosis not present

## 2018-09-16 DIAGNOSIS — Z96651 Presence of right artificial knee joint: Secondary | ICD-10-CM | POA: Diagnosis present

## 2018-09-16 DIAGNOSIS — F329 Major depressive disorder, single episode, unspecified: Secondary | ICD-10-CM | POA: Diagnosis present

## 2018-09-16 DIAGNOSIS — R4182 Altered mental status, unspecified: Secondary | ICD-10-CM | POA: Diagnosis present

## 2018-09-16 LAB — PROTIME-INR
INR: 1 (ref 0.8–1.2)
Prothrombin Time: 13.4 seconds (ref 11.4–15.2)

## 2018-09-16 LAB — CBC
HCT: 34.8 % — ABNORMAL LOW (ref 36.0–46.0)
Hemoglobin: 11.6 g/dL — ABNORMAL LOW (ref 12.0–15.0)
MCH: 35.9 pg — AB (ref 26.0–34.0)
MCHC: 33.3 g/dL (ref 30.0–36.0)
MCV: 107.7 fL — ABNORMAL HIGH (ref 80.0–100.0)
Platelets: 106 10*3/uL — ABNORMAL LOW (ref 150–400)
RBC: 3.23 MIL/uL — ABNORMAL LOW (ref 3.87–5.11)
RDW: 15.2 % (ref 11.5–15.5)
WBC: 6.7 10*3/uL (ref 4.0–10.5)
nRBC: 0 % (ref 0.0–0.2)

## 2018-09-16 LAB — COMPREHENSIVE METABOLIC PANEL
ALT: 44 U/L (ref 0–44)
AST: 72 U/L — ABNORMAL HIGH (ref 15–41)
Albumin: 4.5 g/dL (ref 3.5–5.0)
Alkaline Phosphatase: 86 U/L (ref 38–126)
Anion gap: 9 (ref 5–15)
BUN: 31 mg/dL — ABNORMAL HIGH (ref 8–23)
CO2: 25 mmol/L (ref 22–32)
CREATININE: 1.6 mg/dL — AB (ref 0.44–1.00)
Calcium: 9.3 mg/dL (ref 8.9–10.3)
Chloride: 107 mmol/L (ref 98–111)
GFR calc Af Amer: 35 mL/min — ABNORMAL LOW (ref >60)
GFR calc non Af Amer: 31 mL/min — ABNORMAL LOW (ref >60)
Glucose, Bld: 137 mg/dL — ABNORMAL HIGH (ref 70–99)
Potassium: 3.5 mmol/L (ref 3.5–5.1)
Sodium: 141 mmol/L (ref 135–145)
Total Bilirubin: 0.3 mg/dL (ref 0.3–1.2)
Total Protein: 7 g/dL (ref 6.5–8.1)

## 2018-09-16 LAB — DIFFERENTIAL
Abs Immature Granulocytes: 0.08 10*3/uL — ABNORMAL HIGH (ref 0.00–0.07)
Basophils Absolute: 0 10*3/uL (ref 0.0–0.1)
Basophils Relative: 1 %
Eosinophils Absolute: 0.3 10*3/uL (ref 0.0–0.5)
Eosinophils Relative: 4 %
Immature Granulocytes: 1 %
Lymphocytes Relative: 42 %
Lymphs Abs: 2.8 10*3/uL (ref 0.7–4.0)
MONOS PCT: 8 %
Monocytes Absolute: 0.5 10*3/uL (ref 0.1–1.0)
Neutro Abs: 2.9 10*3/uL (ref 1.7–7.7)
Neutrophils Relative %: 44 %

## 2018-09-16 LAB — APTT: aPTT: 26 seconds (ref 24–36)

## 2018-09-16 LAB — TROPONIN I: Troponin I: 0.03 ng/mL (ref <0.03)

## 2018-09-16 LAB — CBG MONITORING, ED: Glucose-Capillary: 132 mg/dL — ABNORMAL HIGH (ref 70–99)

## 2018-09-16 LAB — BRAIN NATRIURETIC PEPTIDE: B Natriuretic Peptide: 378 pg/mL — ABNORMAL HIGH (ref 0.0–100.0)

## 2018-09-16 MED ORDER — NALOXONE HCL 2 MG/2ML IJ SOSY
PREFILLED_SYRINGE | INTRAMUSCULAR | Status: AC | PRN
  Administered 2018-09-16: 2 mg via INTRAVENOUS

## 2018-09-16 MED ORDER — NICARDIPINE HCL IN NACL 20-0.86 MG/200ML-% IV SOLN
0.0000 mg/h | INTRAVENOUS | Status: DC
  Administered 2018-09-17 (×3): 5 mg/h via INTRAVENOUS
  Filled 2018-09-16 (×3): qty 200

## 2018-09-16 MED ORDER — PROPOFOL 1000 MG/100ML IV EMUL
5.0000 ug/kg/min | INTRAVENOUS | Status: DC
  Administered 2018-09-16: 5 ug/kg/min via INTRAVENOUS

## 2018-09-16 MED ORDER — NALOXONE HCL 2 MG/2ML IJ SOSY
PREFILLED_SYRINGE | INTRAMUSCULAR | Status: AC
  Filled 2018-09-16: qty 2

## 2018-09-16 MED ORDER — SODIUM CHLORIDE 0.9% FLUSH
3.0000 mL | Freq: Once | INTRAVENOUS | Status: DC
Start: 2018-09-16 — End: 2018-09-17

## 2018-09-16 MED ORDER — PROPOFOL 1000 MG/100ML IV EMUL
INTRAVENOUS | Status: AC
  Filled 2018-09-16: qty 100

## 2018-09-16 MED ORDER — ROCURONIUM BROMIDE 50 MG/5ML IV SOLN
INTRAVENOUS | Status: AC | PRN
  Administered 2018-09-16: 50 mg via INTRAVENOUS

## 2018-09-16 MED ORDER — ETOMIDATE 2 MG/ML IV SOLN
INTRAVENOUS | Status: AC | PRN
  Administered 2018-09-16: 10 mg via INTRAVENOUS

## 2018-09-16 NOTE — ED Notes (Signed)
Hospitalist at bedside 

## 2018-09-16 NOTE — Code Documentation (Signed)
Pt unresponsive to stimuli, c-collar placed due to fall at home, pupils non reactive per Dr Sabra Heck,

## 2018-09-16 NOTE — ED Provider Notes (Signed)
[facility] EMERGENCY DEPARTMENT Provider Note   CSN: [id number] Arrival date & time: 09/14/2018  2150    History   Chief Complaint Chief Complaint  Patient presents with  . Altered Mental Status    HPI Ana Nelson is a 78 y.o. female.     HPI  The patient is a 78 year old female, she has a known history of migraine headaches according to the paramedics who took report from the patient's family member, according to the family member the patient had been complaining of a headache this evening which was behind her eye, she states that it was severe, she then stood up to go to the other room and there was a fall that was heard by her husband who was in another room, when he came in she was on the ground and was initially able to talk talking about her severe headache but over the next couple of minutes she stopped talking altogether and went unresponsive.  Paramedics said that the patient was agonal, they did not need to do CPR but they did try to intubate on the scene.  She vomited a large amount of material when they tried to intubate so they withdrew the tube and gave her bag-valve-mask ventilation on the way to the hospital.  They had no other abnormal vital signs except for her agonal respirations.  The patient is unable to give me any information secondary to her obtunded state, she is not on any anticoagulants according to the family members,  Level 5 caveat applies.  Past Medical History:  Diagnosis Date  . Blindness of right eye   . CML (chronic myelocytic leukemia) ([place])   . 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 Depression   . Diverticulitis   . Drooping eyelid    left eye  . GERD (gastroesophageal reflux disease)   . H/O: CML (chronic myeloid leukemia)   . History of hiatal hernia   . History of migraine headaches    "continuous migraines" per Heme/Onc note 04/2015  . Hypothyroidism   . Injury of left shoulder   . Osteoarthritis   .  Osteoarthritis    right knee  . Right knee pain   . Shingles   . Wears glasses     Patient Active Problem List   Diagnosis Date Noted  . Family hx of colon cancer 04/17/2018  . Left upper quadrant pain 04/17/2018  . Rectal bleeding 04/17/2018  . 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 Preoperative clearance 10/27/2016  . Chronic right shoulder pain 07/22/2016  . Unilateral primary osteoarthritis, right knee 06/15/2016  . 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 APPENDECTOMY    . Bone Marrow Bx & Asp    . bowel blockage surgery    . COLONOSCOPY    . COLONOSCOPY N/A 04/19/2018   Procedure: COLONOSCOPY;  Surgeon: Rogene Houston, MD;  Location[facility];  Service: Endoscopy;  Laterality: N/A;  . right kidney removed  1980  . Rt. Cataract surgery    . TOTAL KNEE ARTHROPLASTY Right 01/18/2017  . TOTAL KNEE ARTHROPLASTY Right 01/18/2017   Procedure: RIGHT TOTAL KNEE ARTHROPLASTY;  Surgeon: Newt Minion, MD;  Location: [place];  Service: Orthopedics;  Laterality: Right;     OB History   No obstetric history on file.      Home Medications    Prior to Admission medications   Medication Sig Start Date  End Date Taking? Authorizing Provider  ALPRAZolam Duanne Moron) 1 MG tablet Take 1 mg by mouth 2 (two) times daily as needed for anxiety or sleep.     [provider]  butorphanol (STADOL) 10 MG/ML nasal spray Place 1 spray into the nose every 4 (four) hours as needed for migraine (migraines).     [provider]  cyanocobalamin (,VITAMIN B-12,) 1000 MCG/ML injection INJECT 1ML EVERY MONTH 04/16/18   [provider]  gabapentin (NEURONTIN) 100 MG capsule Take 1 capsule (100 mg total) by mouth at bedtime. May increase as tolerated up tp 3 capsules at night 05/04/18   Magnus Sinning, MD  imatinib (GLEEVEC) 400 MG tablet TAKE 1 TABLET BY MOUTH EVERY DAY with food AND a large GLASS  of water *always USE secondary insurance [employer] ON this rx 06/13/18   Higgs, Mathis Dad, MD  lidocaine (LIDODERM) 5 % Place 1 patch onto the skin daily as needed (pain). Remove & Discard patch within 12 hours or as directed by MD    [provider]  loperamide (IMODIUM) 2 MG capsule Take 2 mg by mouth as needed for diarrhea or loose stools.    [provider]  Multiple Vitamin (MULTIVITAMIN) tablet Take 1 tablet by mouth daily.    [provider]  NON FORMULARY Vitamin B12 sq once a month    [provider]  potassium chloride (K-DUR,KLOR-CON) 10 MEQ tablet  05/29/18   [provider]  promethazine (PHENERGAN) 25 MG tablet Take 12.5 mg by mouth 4 (four) times daily as needed for nausea or vomiting (when experiencing a migraine).  01/23/17   [provider]  pyridOXINE (VITAMIN B-6) 100 MG tablet Take 100 mg by mouth 2 (two) times daily.    [provider]  sertraline (ZOLOFT) 50 MG tablet Take 50 mg by mouth daily.    [provider]  SUMAtriptan (IMITREX) 100 MG tablet Take 100 mg by mouth every 2 (two) hours as needed for migraine. May repeat in 2 hours if headache persists or recurs.    [provider]  SYNTHROID 100 MCG tablet Take 100 mcg by mouth daily before breakfast.  05/08/17   [provider]    Family History Family History  Problem Relation Age of Onset  . Cancer Maternal Uncle        bone  . Cancer Paternal Aunt        uterine  . Cancer Maternal Grandmother        breast  . Cancer Maternal Grandfather        throat  . Cancer Paternal Grandmother        leukemia  . Cancer Son        colon  . Stroke Mother   . COPD Father     Social History Social History   Tobacco Use  . Smoking status: Never Smoker  . Smokeless tobacco: Never Used  Substance Use Topics  . Alcohol use: No  . Drug use: No     Allergies   Nubain [nalbuphine hcl]; Perphenazine; Vistaril [hydroxyzine hcl]; and  Aspirin   Review of Systems Review of Systems  Unable to perform ROS: Acuity of condition     Physical Exam Updated Vital Signs BP (!) 184/87   Pulse 93   Resp (!) 29   Ht 1.524 m (5')   Wt 51.6 kg   SpO2 100%   BMI 22.22 kg/m   Physical Exam Vitals signs and nursing note reviewed.  Constitutional:      General: She is in acute distress.     Appearance: She is well-developed. She is ill-appearing, toxic-appearing and diaphoretic.  HENT:     Head: Normocephalic and atraumatic.     Mouth/Throat:     Pharynx: No oropharyngeal exudate.  Eyes:     General: No scleral icterus.       Right eye: No discharge.        Left eye: No discharge.     Conjunctiva/sclera: Conjunctivae normal.     Comments: Pupils are 6 mm and nonreactive bilaterally  Neck:     Thyroid: No thyromegaly.     Vascular: No JVD.  Cardiovascular:     Rate and Rhythm: Normal rate and regular rhythm.     Heart sounds: Normal heart sounds. No murmur. No friction rub. No gallop.   Pulmonary:     Effort: Respiratory distress present.     Breath sounds: Rhonchi present. No wheezing or rales.     Comments: Assisted ventilations with rhonchi bilaterally, spontaneously the patient is breathing very slowly. Abdominal:     General: Bowel sounds are normal. There is no distension.     Palpations: Abdomen is soft. There is no mass.     Tenderness: There is no abdominal tenderness.  Musculoskeletal: Normal range of motion.        General: No tenderness.  Lymphadenopathy:     Cervical: No cervical adenopathy.  Skin:    General: Skin is warm.     Findings: No erythema or rash.  Neurological:     Comments: Unresponsive, no response to painful stimuli  Psychiatric:        Behavior: Behavior normal.      ED Treatments / Results  Labs (all labs ordered are listed, but only abnormal results are displayed) Labs Reviewed  CBC - Abnormal; Notable for the following components:      Result Value   RBC 3.23 (*)      Hemoglobin 11.6 (*)    HCT 34.8 (*)    MCV 107.7 (*)    MCH 35.9 (*)    Platelets 106 (*)    All other components within normal limits  DIFFERENTIAL - Abnormal; Notable for the following components:   Abs Immature Granulocytes 0.08 (*)    All other components within normal limits  CBG MONITORING, ED - Abnormal; Notable for the following components:   Glucose-Capillary 132 (*)    All other components within normal limits  PROTIME-INR  APTT  COMPREHENSIVE METABOLIC PANEL  TROPONIN I  BRAIN NATRIURETIC PEPTIDE  CBG MONITORING, ED    EKG EKG Interpretation  Date/Time:  Sunday September 16 2018 22:03:15 EDT Ventricular Rate:  85 PR Interval:    QRS Duration: 131 QT Interval:  393 QTC Calculation: 468 R Axis:   50 Text Interpretation:  Sinus rhythm Prolonged PR interval Nonspecific intraventricular conduction delay Since last tracing rate faster Confirmed by Noemi Chapel [phone]) on 09/14/2018 11:00:40 PM   Radiology Ct Cervical Spine Wo Contrast  Result Date: 09/19/2018 CLINICAL DATA:  Code stroke. Altered level of consciousness. Unresponsive. Nonreactive pupils. EXAM: CT HEAD WITHOUT CONTRAST CT CERVICAL SPINE WITHOUT CONTRAST TECHNIQUE: Multidetector CT imaging of the head and cervical spine was performed following the standard protocol without intravenous contrast. Multiplanar CT image reconstructions of the cervical spine were also generated. COMPARISON:  None. FINDINGS: CT HEAD FINDINGS BRAIN: 5.1 x 7.8 x 4.4 cm (volume = 92 cc) RIGHT temporoparietal, operculum, insula and RIGHT basal ganglia  hemorrhage with intraventricular extension. Surrounding vasogenic edema and regional mass effect. 16 mm RIGHT to LEFT midline shift. Effaced RIGHT lateral ventricle with LEFT ventricle entrapment. Dependent blood products within the ventricles including fourth ventricle. Basal cisterns effacement. RIGHT uncal herniation. No acute large vascular territory infarct. VASCULAR: Moderate calcific  atherosclerosis of the carotid siphons. SKULL: No skull fracture. No significant scalp soft tissue swelling. SINUSES/ORBITS: Trace paranasal sinus mucosal thickening. Mastoid air cells are well aerated.The included ocular globes and orbital contents are non-suspicious. OTHER: Life support lines in place. ASPECTS [facility] Stroke Program Early CT Score) -not applicable. CT CERVICAL SPINE FINDINGS ALIGNMENT: Maintained lordosis. Grade 1 C2-3 and C3-4 retrolisthesis. Minimal grade 1 C5-6 anterolisthesis. SKULL BASE AND VERTEBRAE: Vertebral bodies intact. LEFT C5-6 facets fused on degenerative basis. Multilevel severe RIGHT facet arthropathy. Moderate C2-3, C3-4 and C6-7 disc height loss with endplate spurring compatible with degenerative discs. No destructive bony lesions. C1-2 articulation maintained. SOFT TISSUES AND SPINAL CANAL: Nonacute. Life support lines in place. Moderate calcific atherosclerosis carotid arteries. DISC LEVELS: Severe canal stenosis C3-4. Severe bilateral C3-4 and LEFT C6-7 neural foraminal narrowing. UPPER CHEST: Lung apices are clear. OTHER: None. IMPRESSION: CT HEAD: 1. Acute 92 cc RIGHT cerebral hemorrhage with intraventricular extension. RIGHT uncal herniation and 16 mm RIGHT to LEFT midline shift with LEFT ventricle entrapment. CT CERVICAL SPINE: 1. No fracture. C2-3 and C3-4 grade 1 spondylolisthesis on degenerative basis. 2. Severe canal stenosis C3-4. Severe C3-4 and C6-7 neural foraminal narrowing. Critical Value/emergent results were called by telephone at the time of interpretation on 10/04/2018 at 10:37 pm to Dr. Noemi Chapel , who verbally acknowledged these results. Electronically Signed   By: Elon Alas M.D.   On: 09/13/2018 22:40   Dg Chest Portable 1 View  Result Date: 09/29/2018 CLINICAL DATA:  Post intubation.  NG tube placement. EXAM: PORTABLE CHEST 1 VIEW COMPARISON:  April 05, 2017 FINDINGS: The ETT is in good position. The NG tube terminates below today's film.  No pneumothorax. The lungs are clear. Cardiomediastinal silhouette is unchanged. IMPRESSION: Support apparatus as above.  No other acute abnormalities. Electronically Signed   By: Dorise Bullion III M.D   On: 10/09/2018 22:18   Ct Head Code Stroke Wo Contrast  Result Date: 09/25/2018 CLINICAL DATA:  Code stroke. Altered level of consciousness. Unresponsive. Nonreactive pupils. EXAM: CT HEAD WITHOUT CONTRAST CT CERVICAL SPINE WITHOUT CONTRAST TECHNIQUE: Multidetector CT imaging of the head and cervical spine was performed following the standard protocol without intravenous contrast. Multiplanar CT image reconstructions of the cervical spine were also generated. COMPARISON:  None. FINDINGS: CT HEAD FINDINGS BRAIN: 5.1 x 7.8 x 4.4 cm (volume = 92 cc) RIGHT temporoparietal, operculum, insula and RIGHT basal ganglia hemorrhage with intraventricular extension. Surrounding vasogenic edema and regional mass effect. 16 mm RIGHT to LEFT midline shift. Effaced RIGHT lateral ventricle with LEFT ventricle entrapment. Dependent blood products within the ventricles including fourth ventricle. Basal cisterns effacement. RIGHT uncal herniation. No acute large vascular territory infarct. VASCULAR: Moderate calcific atherosclerosis of the carotid siphons. SKULL: No skull fracture. No significant scalp soft tissue swelling. SINUSES/ORBITS: Trace paranasal sinus mucosal thickening. Mastoid air cells are well aerated.The included ocular globes and orbital contents are non-suspicious. OTHER: Life support lines in place. ASPECTS [facility] Stroke Program Early CT Score) -not applicable. CT CERVICAL SPINE FINDINGS ALIGNMENT: Maintained lordosis. Grade 1 C2-3 and C3-4 retrolisthesis. Minimal grade 1 C5-6 anterolisthesis. SKULL BASE AND VERTEBRAE: Vertebral bodies intact. LEFT C5-6 facets fused on degenerative basis. Multilevel severe RIGHT  facet arthropathy. Moderate C2-3, C3-4 and C6-7 disc height loss with endplate spurring compatible  with degenerative discs. No destructive bony lesions. C1-2 articulation maintained. SOFT TISSUES AND SPINAL CANAL: Nonacute. Life support lines in place. Moderate calcific atherosclerosis carotid arteries. DISC LEVELS: Severe canal stenosis C3-4. Severe bilateral C3-4 and LEFT C6-7 neural foraminal narrowing. UPPER CHEST: Lung apices are clear. OTHER: None. IMPRESSION: CT HEAD: 1. Acute 92 cc RIGHT cerebral hemorrhage with intraventricular extension. RIGHT uncal herniation and 16 mm RIGHT to LEFT midline shift with LEFT ventricle entrapment. CT CERVICAL SPINE: 1. No fracture. C2-3 and C3-4 grade 1 spondylolisthesis on degenerative basis. 2. Severe canal stenosis C3-4. Severe C3-4 and C6-7 neural foraminal narrowing. Critical Value/emergent results were called by telephone at the time of interpretation on 10/09/2018 at 10:37 pm to Dr. Noemi Chapel , who verbally acknowledged these results. Electronically Signed   By: Elon Alas M.D.   On: 09/09/2018 22:40    Procedures .Critical Care Performed by: Noemi Chapel, MD Authorized by: Noemi Chapel, MD   Critical care provider statement:    Critical care time (minutes):  35   Critical care time was exclusive of:  Separately billable procedures and treating other patients and teaching time   Critical care was necessary to treat or prevent imminent or life-threatening deterioration of the following conditions:  Respiratory failure   Critical care was time spent personally by me on the following activities:  Blood draw for specimens, development of treatment plan with patient or surrogate, discussions with consultants, evaluation of patient's response to treatment, examination of patient, obtaining history from patient or surrogate, ordering and performing treatments and interventions, ordering and review of laboratory studies, ordering and review of radiographic studies, pulse oximetry, re-evaluation of patient's condition and review of old  charts Procedure Name: Intubation Date/Time: 09/29/2018 10:15 PM Performed by: Noemi Chapel, MD Pre-anesthesia Checklist: Patient identified, Patient being monitored, Emergency Drugs available, Timeout performed and Suction available Oxygen Delivery Method: Non-rebreather mask Preoxygenation: Pre-oxygenation with 100% oxygen Induction Type: Rapid sequence Ventilation: Mask ventilation without difficulty Laryngoscope Size: Mac and 4 Grade View: Grade II Tube size: 7.0 mm Number of attempts: 1 Airway Equipment and Method: Stylet Placement Confirmation: ETT inserted through vocal cords under direct vision,  CO2 detector and Breath sounds checked- equal and bilateral Secured at: 22 cm Tube secured with: ETT holder Dental Injury: Teeth and Oropharynx as per pre-operative assessment  Difficulty Due To: Difficulty was anticipated Comments: The patient had edema of her bilateral vocal cords.      (including critical care time)  Medications Ordered in ED Medications  sodium chloride flush (NS) 0.9 % injection 3 mL (3 mLs Intravenous Not Given 09/26/2018 2220)  propofol (DIPRIVAN) 1000 MG/100ML infusion (5 mcg/kg/min  51.6 kg Intravenous New Bag/Given 09/20/2018 2216)  propofol (DIPRIVAN) 1000 MG/100ML infusion (has no administration in time range)  nicardipine (CARDENE) 64m in 0.86% saline 2096mIV infusion (0.1 mg/ml) (has no administration in time range)  nalox[facility] ([facility] ( Intravenous Canceled Entry 09/27/2018 2200)  etomidate (AMIDATE) injection (10 mg Intravenous Given 10/07/2018 2154)  rocuronium (ZEMURON) injection (50 mg Intravenous Given 09/24/2018 2156)     Initial Impression / Assessment and Plan / ED Course  I have reviewed the triage vital signs and the nursing notes.  Pertinent labs & imaging results that were available during my care of the patient were reviewed by me and considered in my medical decision making (see chart for details).        The  patient is very  ill-appearing, she has not required CPR and has a good blood pressure, heart rate in the 60s to 80s in sinus rhythm, she has no other findings on her neurologic exam as she is essentially flaccid.  She does have some stiffness to her jaw but there is no signs of seizure activity.  She has been intubated by myself to protect her airway as she is not oxygenating appropriately.  The patient will need a CT scan of the head in case this is an aneurysm rupture or subarachnoid hemorrhage or subdural from the fall.  We will also need to have further evaluation as to the cause of her respiratory arrest.  I personally looked at the CT scan, unfortunately there is a very large severe intracerebral hemorrhage which is causing hydrocephalus.  And unfortunately appears unsurvivable.  I have discussed his care with Dr. Trenton Gammon on call for the neurosurgery service who agrees that this is a nonsurvivable and a nonsurgical injury.  Cardene has been started as a drip, the patient will be moved to the intensive care unit, will discuss with hospitalist  Dr. Darrick Meigs is aware and will admit the patient  Final Clinical Impressions(s) / ED Diagnoses   Final diagnoses:  Intracranial hemorrhage ([place])      Noemi Chapel, MD 09/15/2018 2314

## 2018-09-16 NOTE — Progress Notes (Signed)
Patient transported to CT and back to ED room 2 successfully on vent.

## 2018-09-16 NOTE — ED Notes (Signed)
Date and time results received: 09/10/2018 23:10 (use smartphrase ".now" to insert current time)  Test: trop Critical Value: 0.03  Name of Provider Notified: Dr Sabra Heck  No additional orders given,

## 2018-09-16 NOTE — ED Notes (Signed)
Patient transported to CT 

## 2018-09-16 NOTE — ED Triage Notes (Signed)
Pt arrived to er by ems with altered mental status, pt unresponsive to stimuli, per ems report pt had complained of headache to her spouse and then proceeded to stand up to get her medication and "collapsed", upon ems arrival pt agonal respirations, hypertensive,

## 2018-09-17 DIAGNOSIS — I615 Nontraumatic intracerebral hemorrhage, intraventricular: Secondary | ICD-10-CM | POA: Diagnosis present

## 2018-09-17 DIAGNOSIS — Z515 Encounter for palliative care: Secondary | ICD-10-CM | POA: Diagnosis not present

## 2018-09-17 DIAGNOSIS — W19XXXA Unspecified fall, initial encounter: Secondary | ICD-10-CM | POA: Diagnosis present

## 2018-09-17 DIAGNOSIS — F329 Major depressive disorder, single episode, unspecified: Secondary | ICD-10-CM | POA: Diagnosis present

## 2018-09-17 DIAGNOSIS — R4182 Altered mental status, unspecified: Secondary | ICD-10-CM | POA: Diagnosis present

## 2018-09-17 DIAGNOSIS — Z79890 Hormone replacement therapy (postmenopausal): Secondary | ICD-10-CM | POA: Diagnosis not present

## 2018-09-17 DIAGNOSIS — K219 Gastro-esophageal reflux disease without esophagitis: Secondary | ICD-10-CM | POA: Diagnosis present

## 2018-09-17 DIAGNOSIS — G919 Hydrocephalus, unspecified: Secondary | ICD-10-CM | POA: Diagnosis present

## 2018-09-17 DIAGNOSIS — N183 Chronic kidney disease, stage 3 (moderate): Secondary | ICD-10-CM | POA: Diagnosis present

## 2018-09-17 DIAGNOSIS — K449 Diaphragmatic hernia without obstruction or gangrene: Secondary | ICD-10-CM | POA: Diagnosis present

## 2018-09-17 DIAGNOSIS — Z66 Do not resuscitate: Secondary | ICD-10-CM | POA: Diagnosis not present

## 2018-09-17 DIAGNOSIS — Z7189 Other specified counseling: Secondary | ICD-10-CM

## 2018-09-17 DIAGNOSIS — I129 Hypertensive chronic kidney disease with stage 1 through stage 4 chronic kidney disease, or unspecified chronic kidney disease: Secondary | ICD-10-CM | POA: Diagnosis present

## 2018-09-17 DIAGNOSIS — H5461 Unqualified visual loss, right eye, normal vision left eye: Secondary | ICD-10-CM | POA: Diagnosis present

## 2018-09-17 DIAGNOSIS — Z888 Allergy status to other drugs, medicaments and biological substances status: Secondary | ICD-10-CM | POA: Diagnosis not present

## 2018-09-17 DIAGNOSIS — J9611 Chronic respiratory failure with hypoxia: Secondary | ICD-10-CM | POA: Diagnosis not present

## 2018-09-17 DIAGNOSIS — I612 Nontraumatic intracerebral hemorrhage in hemisphere, unspecified: Secondary | ICD-10-CM

## 2018-09-17 DIAGNOSIS — I619 Nontraumatic intracerebral hemorrhage, unspecified: Secondary | ICD-10-CM | POA: Diagnosis present

## 2018-09-17 DIAGNOSIS — Z96651 Presence of right artificial knee joint: Secondary | ICD-10-CM | POA: Diagnosis present

## 2018-09-17 DIAGNOSIS — Z886 Allergy status to analgesic agent status: Secondary | ICD-10-CM | POA: Diagnosis not present

## 2018-09-17 DIAGNOSIS — F419 Anxiety disorder, unspecified: Secondary | ICD-10-CM | POA: Diagnosis present

## 2018-09-17 DIAGNOSIS — I610 Nontraumatic intracerebral hemorrhage in hemisphere, subcortical: Secondary | ICD-10-CM | POA: Diagnosis present

## 2018-09-17 DIAGNOSIS — G43909 Migraine, unspecified, not intractable, without status migrainosus: Secondary | ICD-10-CM | POA: Diagnosis present

## 2018-09-17 DIAGNOSIS — Z79899 Other long term (current) drug therapy: Secondary | ICD-10-CM | POA: Diagnosis not present

## 2018-09-17 DIAGNOSIS — G935 Compression of brain: Secondary | ICD-10-CM | POA: Diagnosis present

## 2018-09-17 DIAGNOSIS — E039 Hypothyroidism, unspecified: Secondary | ICD-10-CM | POA: Diagnosis present

## 2018-09-17 DIAGNOSIS — I629 Nontraumatic intracranial hemorrhage, unspecified: Secondary | ICD-10-CM | POA: Diagnosis present

## 2018-09-17 DIAGNOSIS — C9210 Chronic myeloid leukemia, BCR/ABL-positive, not having achieved remission: Secondary | ICD-10-CM | POA: Diagnosis present

## 2018-09-17 LAB — MRSA PCR SCREENING: MRSA by PCR: NEGATIVE

## 2018-09-17 MED ORDER — CHLORHEXIDINE GLUCONATE 0.12 % MT SOLN
15.0000 mL | Freq: Two times a day (BID) | OROMUCOSAL | Status: DC
  Administered 2018-09-17: 15 mL via OROMUCOSAL

## 2018-09-17 MED ORDER — MORPHINE SULFATE (PF) 2 MG/ML IV SOLN: 2.0000 mg | INTRAVENOUS | Status: DC | PRN

## 2018-09-17 MED ORDER — MORPHINE SULFATE (PF) 2 MG/ML IV SOLN
1.0000 mg | INTRAVENOUS | Status: DC | PRN
  Filled 2018-09-17: qty 1

## 2018-09-17 MED ORDER — ACETAMINOPHEN 325 MG PO TABS: 650.0000 mg | ORAL_TABLET | Freq: Four times a day (QID) | ORAL | Status: DC | PRN

## 2018-09-17 MED ORDER — ORAL CARE MOUTH RINSE
15.0000 mL | Freq: Two times a day (BID) | OROMUCOSAL | Status: DC
  Administered 2018-09-17: 15 mL via OROMUCOSAL

## 2018-09-17 MED ORDER — DEXTROSE 5 % IV SOLN: INTRAVENOUS | Status: DC

## 2018-09-17 MED ORDER — BIOTENE DRY MOUTH MT LIQD: 15.0000 mL | OROMUCOSAL | Status: DC | PRN

## 2018-09-17 MED ORDER — ACETAMINOPHEN 650 MG RE SUPP: 650.0000 mg | Freq: Four times a day (QID) | RECTAL | Status: DC | PRN

## 2018-09-17 MED ORDER — MIDAZOLAM HCL 2 MG/2ML IJ SOLN: 2.0000 mg | INTRAMUSCULAR | Status: DC | PRN

## 2018-09-17 MED ORDER — HYDROMORPHONE HCL 1 MG/ML IJ SOLN
1.0000 mg | INTRAMUSCULAR | Status: DC | PRN
  Administered 2018-09-17: 1 mg via INTRAVENOUS
  Filled 2018-09-17: qty 1

## 2018-09-17 MED ORDER — GLYCOPYRROLATE 1 MG PO TABS: 1.0000 mg | ORAL_TABLET | ORAL | Status: DC | PRN

## 2018-09-17 MED ORDER — GLYCOPYRROLATE 0.2 MG/ML IJ SOLN: 0.2000 mg | INTRAMUSCULAR | Status: DC | PRN

## 2018-09-17 MED ORDER — CHLORHEXIDINE GLUCONATE CLOTH 2 % EX PADS: 6.0000 | MEDICATED_PAD | Freq: Every day | CUTANEOUS | Status: DC

## 2018-09-17 MED ORDER — DIPHENHYDRAMINE HCL 50 MG/ML IJ SOLN: 25.0000 mg | INTRAMUSCULAR | Status: DC | PRN

## 2018-09-17 MED ORDER — POLYVINYL ALCOHOL 1.4 % OP SOLN: 1.0000 [drp] | Freq: Four times a day (QID) | OPHTHALMIC | Status: DC | PRN

## 2018-09-17 MED ORDER — MORPHINE 100MG IN NS 100ML (1MG/ML) PREMIX INFUSION
0.0000 mg/h | INTRAVENOUS | Status: DC
  Filled 2018-09-17: qty 100

## 2018-09-17 MED ORDER — MORPHINE SULFATE (PF) 2 MG/ML IV SOLN
2.0000 mg | INTRAVENOUS | Status: DC | PRN
  Administered 2018-09-17 (×2): 2 mg via INTRAVENOUS
  Filled 2018-09-17: qty 1
  Filled 2018-09-17: qty 2

## 2018-09-17 MED ORDER — LORAZEPAM 2 MG/ML IJ SOLN
2.0000 mg | INTRAMUSCULAR | Status: DC | PRN
  Administered 2018-09-17: 2 mg via INTRAVENOUS
  Filled 2018-09-17: qty 1

## 2018-09-17 MED ORDER — MORPHINE BOLUS VIA INFUSION
5.0000 mg | INTRAVENOUS | Status: DC | PRN
  Filled 2018-09-17: qty 5

## 2018-09-18 DIAGNOSIS — Z515 Encounter for palliative care: Secondary | ICD-10-CM

## 2018-09-18 DIAGNOSIS — Z7189 Other specified counseling: Secondary | ICD-10-CM

## 2018-09-20 ENCOUNTER — Encounter (INDEPENDENT_AMBULATORY_CARE_PROVIDER_SITE_OTHER): Payer: Medicare Other | Admitting: Physical Medicine and Rehabilitation

## 2018-10-10 NOTE — Procedures (Signed)
Extubation Procedure Note Terminal extubation per family request.  Patient Details:   Name: Ana Nelson DOB: 12/04/1940 MRN: [id number]   Airway Documentation:  Airway (Active)    Patients ETT and mouth were suctioned, patient then extubated to room air per doctor order.   Evaluation  Complications: No apparent complications Patient did tolerate procedure well. Bilateral Breath Sounds: Diminished   No  Pete Pelt 2018-10-08, 2:26 PM

## 2018-10-10 NOTE — Progress Notes (Signed)
Present with family for emotional and spiritual support.  

## 2018-10-10 NOTE — H&P (Signed)
TRH H&P    Patient Demographics:    Ana Nelson, is a 78 y.o. female  MRN: [id number]  DOB - Dec 29, 1940  Admit Date - 10/08/2018  Referring MD/NP/PA: Noemi Chapel  Outpatient Primary MD for the patient is Sinda Du, MD  Patient coming from: Home  Chief complaint-altered mental status   HPI:    Ana Nelson  is a 78 y.o. female, with history of migraine headaches was brought to the hospital with altered mental status.  As per patient's husband patient has been complaining of headache this evening behind her eye and stated it was very severe.  Then she stood up to go to the other room and patient's husband heard a thud to the floor.  When he went to see patient she was talking but then started developing slurred speech and became unresponsive.  EMS was called. As per EMS patient was agonal they did not need to do CPR but they tried to intubate her on the scene.  Patient vomited so they did not intubate and she was brought with bag mask ventilation on the way to hospital. In the ED patient was intubated and started on mechanical ventilation. CT of the head showed acute 92 cc right cerebral hemorrhage with intraventricular extension.  Right uncal herniation and 16 mm right-to-left midline shift with left ventricular entrapment.  Neurosurgery on call Dr Annette Stable was consulted by ED physician Dr. Sabra Heck. As per neurosurgery this is nonsurvivable and nonsurgical injury and comfort care was advised.  Patient was started on Cardene drip.    Review of systems:    Unable to obtain as patient is obtunded   Past History of the following :    Past Medical History:  Diagnosis Date  . Blindness of right eye   . CML (chronic myelocytic leukemia) ([place])   . 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 Depression   . Diverticulitis   . Drooping eyelid    left eye  . GERD (gastroesophageal reflux  disease)   . H/O: CML (chronic myeloid leukemia)   . History of hiatal hernia   . History of migraine headaches    "continuous migraines" per Heme/Onc note 04/2015  . Hypothyroidism   . Injury of left shoulder   . Osteoarthritis   . Osteoarthritis    right knee  . Right knee pain   . Shingles   . Wears glasses       Past Surgical History:  Procedure Laterality Date  . APPENDECTOMY    . Bone Marrow Bx & Asp    . bowel blockage surgery    . COLONOSCOPY    . COLONOSCOPY N/A 04/19/2018   Procedure: COLONOSCOPY;  Surgeon: Rogene Houston, MD;  [facility];  Service: Endoscopy;  Laterality: N/A;  . right kidney removed  1980  . Rt. Cataract surgery    . TOTAL KNEE ARTHROPLASTY Right 01/18/2017  . TOTAL KNEE ARTHROPLASTY Right 01/18/2017   Procedure: RIGHT TOTAL KNEE ARTHROPLASTY;  Surgeon: Newt Minion, MD;  Location: [facility];  Service:  Orthopedics;  Laterality: Right;      Social History:      Social History   Tobacco Use  . Smoking status: Never Smoker  . Smokeless tobacco: Never Used  Substance Use Topics  . Alcohol use: No       Family History :     Family History  Problem Relation Age of Onset  . Cancer Maternal Uncle        bone  . Cancer Paternal Aunt        uterine  . Cancer Maternal Grandmother        breast  . Cancer Maternal Grandfather        throat  . Cancer Paternal Grandmother        leukemia  . Cancer Son        colon  . Stroke Mother   . COPD Father       Home Medications:   Prior to Admission medications   Medication Sig Start Date End Date Taking? Authorizing Provider  ALPRAZolam Duanne Moron) 1 MG tablet Take 1 mg by mouth 2 (two) times daily as needed for anxiety or sleep.     [provider]  butorphanol (STADOL) 10 MG/ML nasal spray Place 1 spray into the nose every 4 (four) hours as needed for migraine (migraines).     [provider]  cyanocobalamin (,VITAMIN B-12,) 1000 MCG/ML injection INJECT 1ML  EVERY MONTH 04/16/18   [provider]  gabapentin (NEURONTIN) 100 MG capsule Take 1 capsule (100 mg total) by mouth at bedtime. May increase as tolerated up tp 3 capsules at night 05/04/18   Magnus Sinning, MD  imatinib (GLEEVEC) 400 MG tablet TAKE 1 TABLET BY MOUTH EVERY DAY with food AND a large GLASS of water *always USE secondary insurance [employer] ON this rx 06/13/18   Higgs, Mathis Dad, MD  lidocaine (LIDODERM) 5 % Place 1 patch onto the skin daily as needed (pain). Remove & Discard patch within 12 hours or as directed by MD    [provider]  loperamide (IMODIUM) 2 MG capsule Take 2 mg by mouth as needed for diarrhea or loose stools.    [provider]  Multiple Vitamin (MULTIVITAMIN) tablet Take 1 tablet by mouth daily.    [provider]  NON FORMULARY Vitamin B12 sq once a month    [provider]  potassium chloride (K-DUR,KLOR-CON) 10 MEQ tablet  05/29/18   [provider]  promethazine (PHENERGAN) 25 MG tablet Take 12.5 mg by mouth 4 (four) times daily as needed for nausea or vomiting (when experiencing a migraine).  01/23/17   [provider]  pyridOXINE (VITAMIN B-6) 100 MG tablet Take 100 mg by mouth 2 (two) times daily.    [provider]  sertraline (ZOLOFT) 50 MG tablet Take 50 mg by mouth daily.    [provider]  SUMAtriptan (IMITREX) 100 MG tablet Take 100 mg by mouth every 2 (two) hours as needed for migraine. May repeat in 2 hours if headache persists or recurs.    [provider]  SYNTHROID 100 MCG tablet Take 100 mcg by mouth daily before breakfast.  05/08/17   [provider]     Allergies:     Allergies  Allergen Reactions  . Nubain [Nalbuphine Hcl] Swelling    Tongue swells  . Perphenazine     seizures  . Vistaril [Hydroxyzine Hcl] Swelling and Other (See Comments)    TONGUE SWELLS  . Aspirin  History of Leukemia     Physical Exam:   Vitals  Blood pressure  (!) 157/51, pulse (!) 59, temperature 98.2 F (36.8 C), resp. rate 16, height 5' (1.524 m), weight 51.6 kg, SpO2 100 %.  1.  General: Patient intubated, sedated  2. Psychiatric: Not tested  3. Neurologic: Not tested, patient sedated on mechanical ventilation  4. HEENMT:  Atraumatic normocephalic  5. Respiratory : Clear to auscultation bilaterally, no wheezing or crackles  6. Cardiovascular : S1-S2, regular, no murmur auscultated  7. Gastrointestinal:  Abdomen is soft, nontender, no organomegaly    Data Review:    CBC Recent Labs  Lab 09/30/2018 2235  WBC 6.7  HGB 11.6*  HCT 34.8*  PLT 106*  MCV 107.7*  MCH 35.9*  MCHC 33.3  RDW 15.2  LYMPHSABS 2.8  MONOABS 0.5  EOSABS 0.3  BASOSABS 0.0   ------------------------------------------------------------------------------------------------------------------  Results for orders placed or performed during the hospital encounter of 10/02/2018 (from the past 48 hour(s))  CBG monitoring, ED     Status: Abnormal   Collection Time: 09/19/2018  9:55 PM  Result Value Ref Range   Glucose-Capillary 132 (H) 70 - 99 mg/dL  Protime-INR     Status: None   Collection Time: 09/22/2018 10:35 PM  Result Value Ref Range   Prothrombin Time 13.4 11.4 - 15.2 seconds   INR 1.0 0.8 - 1.2    Comment: (NOTE) INR goal varies based on device and disease states. Performed at [facility], [address] [place], [place] [address]   APTT     Status: None   Collection Time: 09/09/2018 10:35 PM  Result Value Ref Range   aPTT 26 24 - 36 seconds    Comment: Performed at [facility], [address] [place], [place] [address]  CBC     Status: Abnormal   Collection Time: 09/29/2018 10:35 PM  Result Value Ref Range   WBC 6.7 4.0 - 10.5 K/uL   RBC 3.23 (L) 3.87 - 5.11 MIL/uL   Hemoglobin 11.6 (L) 12.0 - 15.0 g/dL   HCT 34.8 (L) 36.0 - 46.0 %   MCV 107.7 (H) 80.0 - 100.0 fL   MCH 35.9 (H) 26.0 - 34.0 pg   MCHC 33.3 30.0 - 36.0 g/dL   RDW 15.2 11.5 -  15.5 %   Platelets 106 (L) 150 - 400 K/uL    Comment: SPECIMEN CHECKED FOR CLOTS   nRBC 0.0 0.0 - 0.2 %    Comment: Performed at [facility], [address], [place], [place] [address]  Differential     Status: Abnormal   Collection Time: 09/19/2018 10:35 PM  Result Value Ref Range   Neutrophils Relative % 44 %   Neutro Abs 2.9 1.7 - 7.7 K/uL   Lymphocytes Relative 42 %   Lymphs Abs 2.8 0.7 - 4.0 K/uL   Monocytes Relative 8 %   Monocytes Absolute 0.5 0.1 - 1.0 K/uL   Eosinophils Relative 4 %   Eosinophils Absolute 0.3 0.0 - 0.5 K/uL   Basophils Relative 1 %   Basophils Absolute 0.0 0.0 - 0.1 K/uL   Immature Granulocytes 1 %   Abs Immature Granulocytes 0.08 (H) 0.00 - 0.07 K/uL    Comment: Performed at [facility], [address], [place],  [address]  Comprehensive metabolic panel     Status: Abnormal   Collection Time: 10/04/2018 10:35 PM  Result Value Ref Range   Sodium 141 135 - 145 mmol/L   Potassium 3.5 3.5 -  5.1 mmol/L   Chloride 107 98 - 111 mmol/L   CO2 25 22 - 32 mmol/L   Glucose, Bld 137 (H) 70 - 99 mg/dL   BUN 31 (H) 8 - 23 mg/dL   Creatinine, Ser 1.60 (H) 0.44 - 1.00 mg/dL   Calcium 9.3 8.9 - 10.3 mg/dL   Total Protein 7.0 6.5 - 8.1 g/dL   Albumin 4.5 3.5 - 5.0 g/dL   AST 72 (H) 15 - 41 U/L   ALT 44 0 - 44 U/L   Alkaline Phosphatase 86 38 - 126 U/L   Total Bilirubin 0.3 0.3 - 1.2 mg/dL   GFR calc non Af Amer 31 (L) >60 mL/min   GFR calc Af Amer 35 (L) >60 mL/min   Anion gap 9 5 - 15    Comment: Performed at [facility], [address], [place], [place] [address]  Troponin I - ONCE - STAT     Status: Abnormal   Collection Time: 09/19/2018 10:35 PM  Result Value Ref Range   Troponin I 0.03 (HH) <0.03 ng/mL    Comment: CRITICAL RESULT CALLED TO, READ BACK BY AND VERIFIED WITH: POINDEXTER,M @ 2306 ON 09/23/2018 BY JUW Performed at [facility], [address] [place], [place] [address]   Brain natriuretic peptide     Status: Abnormal   Collection Time: 09/09/2018  10:35 PM  Result Value Ref Range   B Natriuretic Peptide 378.0 (H) 0.0 - 100.0 pg/mL    Comment: Performed at [facility], [address] [place], [place] [address]    Chemistries  Recent Labs  Lab 10/06/2018 2235  NA 141  K 3.5  CL 107  CO2 25  GLUCOSE 137*  BUN 31*  CREATININE 1.60*  CALCIUM 9.3  AST 72*  ALT 44  ALKPHOS 86  BILITOT 0.3   ------------------------------------------------------------------------------------------------------------------  ------------------------------------------------------------------------------------------------------------------ GFR: Estimated Creatinine Clearance: 20.8 mL/min (A) (by C-G formula based on SCr of 1.6 mg/dL (H)). Liver Function Tests: Recent Labs  Lab 10/01/2018 2235  AST 72*  ALT 44  ALKPHOS 86  BILITOT 0.3  PROT 7.0  ALBUMIN 4.5   No results for input(s): LIPASE, AMYLASE in the last 168 hours. No results for input(s): AMMONIA in the last 168 hours. Coagulation Profile: Recent Labs  Lab 09/12/2018 2235  INR 1.0   Cardiac Enzymes: Recent Labs  Lab 09/28/2018 2235  TROPONINI 0.03*   CBG: Recent Labs  Lab 10/08/2018 2155  GLUCAP 132*    --------------------------------------------------------------------------------------------------------------- Urine analysis:    Component Value Date/Time   COLORURINE YELLOW 04/05/2018 1533   APPEARANCEUR CLEAR 04/05/2018 1533   LABSPEC 1.005 04/05/2018 1533   PHURINE 6.0 04/05/2018 1533   GLUCOSEU NEGATIVE 04/05/2018 1533   HGBUR SMALL (A) 04/05/2018 1533   BILIRUBINUR NEGATIVE 04/05/2018 1533   KETONESUR NEGATIVE 04/05/2018 1533   PROTEINUR NEGATIVE 04/05/2018 1533   UROBILINOGEN 0.2 09/02/2012 0233   NITRITE NEGATIVE 04/05/2018 1533   LEUKOCYTESUR NEGATIVE 04/05/2018 1533      Imaging Results:    Ct Cervical Spine Wo Contrast  Result Date: 09/09/2018 CLINICAL DATA:  Code stroke. Altered level of consciousness. Unresponsive. Nonreactive pupils. EXAM: CT  HEAD WITHOUT CONTRAST CT CERVICAL SPINE WITHOUT CONTRAST TECHNIQUE: Multidetector CT imaging of the head and cervical spine was performed following the standard protocol without intravenous contrast. Multiplanar CT image reconstructions of the cervical spine were also generated. COMPARISON:  None. FINDINGS: CT HEAD FINDINGS BRAIN: 5.1 x 7.8 x 4.4 cm (volume = 92 cc) RIGHT temporoparietal, operculum, insula  and RIGHT basal ganglia hemorrhage with intraventricular extension. Surrounding vasogenic edema and regional mass effect. 16 mm RIGHT to LEFT midline shift. Effaced RIGHT lateral ventricle with LEFT ventricle entrapment. Dependent blood products within the ventricles including fourth ventricle. Basal cisterns effacement. RIGHT uncal herniation. No acute large vascular territory infarct. VASCULAR: Moderate calcific atherosclerosis of the carotid siphons. SKULL: No skull fracture. No significant scalp soft tissue swelling. SINUSES/ORBITS: Trace paranasal sinus mucosal thickening. Mastoid air cells are well aerated.The included ocular globes and orbital contents are non-suspicious. OTHER: Life support lines in place. ASPECTS [facility] Stroke Program Early CT Score) -not applicable. CT CERVICAL SPINE FINDINGS ALIGNMENT: Maintained lordosis. Grade 1 C2-3 and C3-4 retrolisthesis. Minimal grade 1 C5-6 anterolisthesis. SKULL BASE AND VERTEBRAE: Vertebral bodies intact. LEFT C5-6 facets fused on degenerative basis. Multilevel severe RIGHT facet arthropathy. Moderate C2-3, C3-4 and C6-7 disc height loss with endplate spurring compatible with degenerative discs. No destructive bony lesions. C1-2 articulation maintained. SOFT TISSUES AND SPINAL CANAL: Nonacute. Life support lines in place. Moderate calcific atherosclerosis carotid arteries. DISC LEVELS: Severe canal stenosis C3-4. Severe bilateral C3-4 and LEFT C6-7 neural foraminal narrowing. UPPER CHEST: Lung apices are clear. OTHER: None. IMPRESSION: CT HEAD: 1. Acute 92 cc  RIGHT cerebral hemorrhage with intraventricular extension. RIGHT uncal herniation and 16 mm RIGHT to LEFT midline shift with LEFT ventricle entrapment. CT CERVICAL SPINE: 1. No fracture. C2-3 and C3-4 grade 1 spondylolisthesis on degenerative basis. 2. Severe canal stenosis C3-4. Severe C3-4 and C6-7 neural foraminal narrowing. Critical Value/emergent results were called by telephone at the time of interpretation on 09/18/2018 at 10:37 pm to Dr. Noemi Chapel , who verbally acknowledged these results. Electronically Signed   By: Elon Alas M.D.   On: 09/20/2018 22:40   Dg Chest Portable 1 View  Result Date: 09/26/2018 CLINICAL DATA:  Post intubation.  NG tube placement. EXAM: PORTABLE CHEST 1 VIEW COMPARISON:  April 05, 2017 FINDINGS: The ETT is in good position. The NG tube terminates below today's film. No pneumothorax. The lungs are clear. Cardiomediastinal silhouette is unchanged. IMPRESSION: Support apparatus as above.  No other acute abnormalities. Electronically Signed   By: Dorise Bullion III M.D   On: 10/08/2018 22:18   Ct Head Code Stroke Wo Contrast  Result Date: 09/16/2018 CLINICAL DATA:  Code stroke. Altered level of consciousness. Unresponsive. Nonreactive pupils. EXAM: CT HEAD WITHOUT CONTRAST CT CERVICAL SPINE WITHOUT CONTRAST TECHNIQUE: Multidetector CT imaging of the head and cervical spine was performed following the standard protocol without intravenous contrast. Multiplanar CT image reconstructions of the cervical spine were also generated. COMPARISON:  None. FINDINGS: CT HEAD FINDINGS BRAIN: 5.1 x 7.8 x 4.4 cm (volume = 92 cc) RIGHT temporoparietal, operculum, insula and RIGHT basal ganglia hemorrhage with intraventricular extension. Surrounding vasogenic edema and regional mass effect. 16 mm RIGHT to LEFT midline shift. Effaced RIGHT lateral ventricle with LEFT ventricle entrapment. Dependent blood products within the ventricles including fourth ventricle. Basal cisterns  effacement. RIGHT uncal herniation. No acute large vascular territory infarct. VASCULAR: Moderate calcific atherosclerosis of the carotid siphons. SKULL: No skull fracture. No significant scalp soft tissue swelling. SINUSES/ORBITS: Trace paranasal sinus mucosal thickening. Mastoid air cells are well aerated.The included ocular globes and orbital contents are non-suspicious. OTHER: Life support lines in place. ASPECTS [facility] Stroke Program Early CT Score) -not applicable. CT CERVICAL SPINE FINDINGS ALIGNMENT: Maintained lordosis. Grade 1 C2-3 and C3-4 retrolisthesis. Minimal grade 1 C5-6 anterolisthesis. SKULL BASE AND VERTEBRAE: Vertebral bodies intact. LEFT C5-6 facets fused on degenerative  basis. Multilevel severe RIGHT facet arthropathy. Moderate C2-3, C3-4 and C6-7 disc height loss with endplate spurring compatible with degenerative discs. No destructive bony lesions. C1-2 articulation maintained. SOFT TISSUES AND SPINAL CANAL: Nonacute. Life support lines in place. Moderate calcific atherosclerosis carotid arteries. DISC LEVELS: Severe canal stenosis C3-4. Severe bilateral C3-4 and LEFT C6-7 neural foraminal narrowing. UPPER CHEST: Lung apices are clear. OTHER: None. IMPRESSION: CT HEAD: 1. Acute 92 cc RIGHT cerebral hemorrhage with intraventricular extension. RIGHT uncal herniation and 16 mm RIGHT to LEFT midline shift with LEFT ventricle entrapment. CT CERVICAL SPINE: 1. No fracture. C2-3 and C3-4 grade 1 spondylolisthesis on degenerative basis. 2. Severe canal stenosis C3-4. Severe C3-4 and C6-7 neural foraminal narrowing. Critical Value/emergent results were called by telephone at the time of interpretation on 09/10/2018 at 10:37 pm to Dr. Noemi Chapel , who verbally acknowledged these results. Electronically Signed   By: Elon Alas M.D.   On: 09/28/2018 22:40    My personal review of EKG: Rhythm NSR   Assessment & Plan:    Active Problems:   Intracerebral bleed ([place])   1. Right cerebral  hemorrhage with intraventricular extension-patient is not a surgical candidate as per neurosurgery.  ED physician discussed with Dr. Annette Stable in [place].  I discussed with family and they understand patient's grave prognosis.  At this time family is opting for comfort measures only.  However they want to continue with mechanical ventilation till other members of the family arrive.  Will obtain palliative care consultation in a.m. plan would be to wean off life support in next 24 hours and initiate comfort measures.  Anticipate hospital death.   2. Hypertensive urgency-blood pressure was found to be elevated, patient started on Cardene drip.  We will continue with Cardene drip at this time.    DVT Prophylaxis-   SCDs  AM Labs Ordered, also please review Full Orders  Family Communication: Admission, patients condition and plan of care including tests being ordered have been discussed with the patient's husband on phone* who indicate understanding and agree with the plan and Code Status.  Code Status: DNR  Admission status: Inpatient: Based on patients clinical presentation and evaluation of above clinical data, I have made determination that patient meets Inpatient criteria at this time.  Time spent in minutes : 60 minutes   Oswald Hillock M.D on Oct 11, 2018 at 1:57 AM

## 2018-10-10 NOTE — Discharge Summary (Signed)
Physician Discharge Summary  Patient ID: NYEMA HACHEY MRN: [id number] DOB/AGE: July 25, 1940 78 y.o. Primary Care Physician:Marymargaret Kirker, Percell Miller, MD Admit date: 10/07/2018 Discharge date: 09/18/2018    Discharge Diagnoses:   Active Problems:   Intracerebral bleed (HCC)   Intracerebral hemorrhage (HCC) Syncope related to intracerebral bleed Chronic anxiety Chronic migraine headaches Chronic renal failure stage III Chronic myelocytic leukemia  Allergies as of 2018/09/20      Reactions   Nubain [nalbuphine Hcl] Swelling   Tongue swells   Perphenazine    seizures   Vistaril [hydroxyzine Hcl] Swelling, Other (See Comments)   TONGUE SWELLS   Aspirin    History of Leukemia      Medication List    ASK your doctor about these medications   ALPRAZolam 1 MG tablet Commonly known as:  XANAX Take 1 mg by mouth 2 (two) times daily as needed for anxiety or sleep.   butorphanol 10 MG/ML nasal spray Commonly known as:  STADOL Place 1 spray into the nose every 4 (four) hours as needed for migraine (migraines).   cyanocobalamin 1000 MCG/ML injection Commonly known as:  (VITAMIN B-12) INJECT 1ML EVERY MONTH   gabapentin 100 MG capsule Commonly known as:  NEURONTIN Take 1 capsule (100 mg total) by mouth at bedtime. May increase as tolerated up tp 3 capsules at night   imatinib 400 MG tablet Commonly known as:  GLEEVEC TAKE 1 TABLET BY MOUTH EVERY DAY with food AND a large GLASS of water *always USE secondary insurance [employer] ON this rx   lidocaine 5 % Commonly known as:  LIDODERM Place 1 patch onto the skin daily as needed (pain). Remove & Discard patch within 12 hours or as directed by MD   loperamide 2 MG capsule Commonly known as:  IMODIUM Take 2 mg by mouth as needed for diarrhea or loose stools.   multivitamin tablet Take 1 tablet by mouth daily.   NON FORMULARY Vitamin B12 sq once a month   potassium chloride 10 MEQ tablet Commonly known as:  K-DUR,KLOR-CON    promethazine 25 MG tablet Commonly known as:  PHENERGAN Take 12.5 mg by mouth 4 (four) times daily as needed for nausea or vomiting (when experiencing a migraine).   pyridOXINE 100 MG tablet Commonly known as:  VITAMIN B-6 Take 100 mg by mouth 2 (two) times daily.   sertraline 50 MG tablet Commonly known as:  ZOLOFT Take 50 mg by mouth daily.   SUMAtriptan 100 MG tablet Commonly known as:  IMITREX Take 100 mg by mouth every 2 (two) hours as needed for migraine. May repeat in 2 hours if headache persists or recurs.   Synthroid 100 MCG tablet Generic drug:  levothyroxine Take 100 mcg by mouth daily before breakfast.       Discharged Condition: Deceased    Consults: Telephone with neurosurgery via emergency department  Significant Diagnostic Studies: Ct Cervical Spine Wo Contrast  Result Date: 09/24/2018 CLINICAL DATA:  Code stroke. Altered level of consciousness. Unresponsive. Nonreactive pupils. EXAM: CT HEAD WITHOUT CONTRAST CT CERVICAL SPINE WITHOUT CONTRAST TECHNIQUE: Multidetector CT imaging of the head and cervical spine was performed following the standard protocol without intravenous contrast. Multiplanar CT image reconstructions of the cervical spine were also generated. COMPARISON:  None. FINDINGS: CT HEAD FINDINGS BRAIN: 5.1 x 7.8 x 4.4 cm (volume = 92 cc) RIGHT temporoparietal, operculum, insula and RIGHT basal ganglia hemorrhage with intraventricular extension. Surrounding vasogenic edema and regional mass effect. 16 mm RIGHT to LEFT midline shift.  Effaced RIGHT lateral ventricle with LEFT ventricle entrapment. Dependent blood products within the ventricles including fourth ventricle. Basal cisterns effacement. RIGHT uncal herniation. No acute large vascular territory infarct. VASCULAR: Moderate calcific atherosclerosis of the carotid siphons. SKULL: No skull fracture. No significant scalp soft tissue swelling. SINUSES/ORBITS: Trace paranasal sinus mucosal thickening.  Mastoid air cells are well aerated.The included ocular globes and orbital contents are non-suspicious. OTHER: Life support lines in place. ASPECTS [facility] Stroke Program Early CT Score) -not applicable. CT CERVICAL SPINE FINDINGS ALIGNMENT: Maintained lordosis. Grade 1 C2-3 and C3-4 retrolisthesis. Minimal grade 1 C5-6 anterolisthesis. SKULL BASE AND VERTEBRAE: Vertebral bodies intact. LEFT C5-6 facets fused on degenerative basis. Multilevel severe RIGHT facet arthropathy. Moderate C2-3, C3-4 and C6-7 disc height loss with endplate spurring compatible with degenerative discs. No destructive bony lesions. C1-2 articulation maintained. SOFT TISSUES AND SPINAL CANAL: Nonacute. Life support lines in place. Moderate calcific atherosclerosis carotid arteries. DISC LEVELS: Severe canal stenosis C3-4. Severe bilateral C3-4 and LEFT C6-7 neural foraminal narrowing. UPPER CHEST: Lung apices are clear. OTHER: None. IMPRESSION: CT HEAD: 1. Acute 92 cc RIGHT cerebral hemorrhage with intraventricular extension. RIGHT uncal herniation and 16 mm RIGHT to LEFT midline shift with LEFT ventricle entrapment. CT CERVICAL SPINE: 1. No fracture. C2-3 and C3-4 grade 1 spondylolisthesis on degenerative basis. 2. Severe canal stenosis C3-4. Severe C3-4 and C6-7 neural foraminal narrowing. Critical Value/emergent results were called by telephone at the time of interpretation on 10/08/2018 at 10:37 pm to Dr. Noemi Chapel , who verbally acknowledged these results. Electronically Signed   By: Elon Alas M.D.   On: 09/10/2018 22:40   Dg Chest Portable 1 View  Result Date: 09/27/2018 CLINICAL DATA:  Post intubation.  NG tube placement. EXAM: PORTABLE CHEST 1 VIEW COMPARISON:  April 05, 2017 FINDINGS: The ETT is in good position. The NG tube terminates below today's film. No pneumothorax. The lungs are clear. Cardiomediastinal silhouette is unchanged. IMPRESSION: Support apparatus as above.  No other acute abnormalities. Electronically  Signed   By: Dorise Bullion III M.D   On: 09/10/2018 22:18   Ct Head Code Stroke Wo Contrast  Result Date: 10/03/2018 CLINICAL DATA:  Code stroke. Altered level of consciousness. Unresponsive. Nonreactive pupils. EXAM: CT HEAD WITHOUT CONTRAST CT CERVICAL SPINE WITHOUT CONTRAST TECHNIQUE: Multidetector CT imaging of the head and cervical spine was performed following the standard protocol without intravenous contrast. Multiplanar CT image reconstructions of the cervical spine were also generated. COMPARISON:  None. FINDINGS: CT HEAD FINDINGS BRAIN: 5.1 x 7.8 x 4.4 cm (volume = 92 cc) RIGHT temporoparietal, operculum, insula and RIGHT basal ganglia hemorrhage with intraventricular extension. Surrounding vasogenic edema and regional mass effect. 16 mm RIGHT to LEFT midline shift. Effaced RIGHT lateral ventricle with LEFT ventricle entrapment. Dependent blood products within the ventricles including fourth ventricle. Basal cisterns effacement. RIGHT uncal herniation. No acute large vascular territory infarct. VASCULAR: Moderate calcific atherosclerosis of the carotid siphons. SKULL: No skull fracture. No significant scalp soft tissue swelling. SINUSES/ORBITS: Trace paranasal sinus mucosal thickening. Mastoid air cells are well aerated.The included ocular globes and orbital contents are non-suspicious. OTHER: Life support lines in place. ASPECTS [facility] Stroke Program Early CT Score) -not applicable. CT CERVICAL SPINE FINDINGS ALIGNMENT: Maintained lordosis. Grade 1 C2-3 and C3-4 retrolisthesis. Minimal grade 1 C5-6 anterolisthesis. SKULL BASE AND VERTEBRAE: Vertebral bodies intact. LEFT C5-6 facets fused on degenerative basis. Multilevel severe RIGHT facet arthropathy. Moderate C2-3, C3-4 and C6-7 disc height loss with endplate spurring compatible with degenerative discs. No  destructive bony lesions. C1-2 articulation maintained. SOFT TISSUES AND SPINAL CANAL: Nonacute. Life support lines in place. Moderate  calcific atherosclerosis carotid arteries. DISC LEVELS: Severe canal stenosis C3-4. Severe bilateral C3-4 and LEFT C6-7 neural foraminal narrowing. UPPER CHEST: Lung apices are clear. OTHER: None. IMPRESSION: CT HEAD: 1. Acute 92 cc RIGHT cerebral hemorrhage with intraventricular extension. RIGHT uncal herniation and 16 mm RIGHT to LEFT midline shift with LEFT ventricle entrapment. CT CERVICAL SPINE: 1. No fracture. C2-3 and C3-4 grade 1 spondylolisthesis on degenerative basis. 2. Severe canal stenosis C3-4. Severe C3-4 and C6-7 neural foraminal narrowing. Critical Value/emergent results were called by telephone at the time of interpretation on 10/08/2018 at 10:37 pm to Dr. Noemi Chapel , who verbally acknowledged these results. Electronically Signed   By: Elon Alas M.D.   On: 10/07/2018 22:40    Lab Results: Basic Metabolic Panel: Recent Labs    10/06/2018 2235  NA 141  K 3.5  CL 107  CO2 25  GLUCOSE 137*  BUN 31*  CREATININE 1.60*  CALCIUM 9.3   Liver Function Tests: Recent Labs    09/13/2018 2235  AST 72*  ALT 44  ALKPHOS 86  BILITOT 0.3  PROT 7.0  ALBUMIN 4.5     CBC: Recent Labs    09/23/2018 2235  WBC 6.7  NEUTROABS 2.9  HGB 11.6*  HCT 34.8*  MCV 107.7*  PLT 106*    Recent Results (from the past 240 hour(s))  MRSA PCR Screening     Status: None   Collection Time: 2018/09/25  6:13 AM  Result Value Ref Range Status   MRSA by PCR NEGATIVE NEGATIVE Final    Comment:        The GeneXpert MRSA Assay (FDA approved for NASAL specimens only), is one component of a comprehensive MRSA colonization surveillance program. It is not intended to diagnose MRSA infection nor to guide or monitor treatment for MRSA infections. Performed at [facility], [address], [place], [place] [address]      Hospital Course: This was a 78 year old who had been in her usual state of fairly poor health at home when she awoke complaining of a severe headache.  She has migraine  headaches at baseline so she was taking her regular medication for that when she collapsed.  According to her husband she had agonal respirations and he called EMS for help.  When EMS arrived he was unresponsive.  She eventually was intubated and placed on the ventilator and then had CT that showed a large intracerebral bleed with right uncal herniation.  Telephone consultation with neurosurgery from the emergency department physician revealed that this was not a lesion that could be treated surgically and that it was likely not survivable.  She was taken to use the intensive care unit remained on the ventilator pending family arrival.  She had compassionate extubation on the ninth after family arrived and passed away soon after.  Discharge Exam: Blood pressure (!) 108/47, pulse 96, temperature 99.9 F (37.7 C), resp. rate 15, height 5' (1.524 m), weight 53.9 kg, SpO2 100 %. Not applicable  Disposition: Released to funeral home      Signed: [name]   09/18/2018, 8:34 AM

## 2018-10-10 NOTE — Progress Notes (Signed)
Subjective: She was admitted with a large intracerebral bleed which does not appear to be a survivable event.  Her situation was discussed with neurosurgery while she was in the emergency department and they did not see any opportunity for surgical treatment.  She was intubated and started on the ventilator in the ER and remains on the ventilator now.  Discussed with family at bedside and they are waiting for other family members to arrive.  Objective: Vital signs in last 24 hours: Temp:  [93.2 F (34 C)-99.3 F (37.4 C)] 99.3 F (37.4 C) (03/09 0700) Pulse Rate:  [45-93] 80 (03/09 0700) Resp:  [14-29] 17 (03/09 0700) BP: (119-184)/(41-108) 119/48 (03/09 0700) SpO2:  [99 %-100 %] 100 % (03/09 0700) FiO2 (%):  [35 %-50 %] 35 % (03/09 0600) Weight:  [51.6 kg-53.9 kg] 53.9 kg (03/09 0600) Weight change:     Intake/Output from previous day: 03/08 0701 - 03/09 0700 In: 117.9 [I.V.:117.9] Out: 1050 [Urine:1050]  PHYSICAL EXAM General appearance: Intubated sedated on mechanical ventilation Resp: rhonchi bilaterally Cardio: regular rate and rhythm, S1, S2 normal, no murmur, click, rub or gallop GI: soft, non-tender; bowel sounds normal; no masses,  no organomegaly Extremities: extremities normal, atraumatic, no cyanosis or edema  Lab Results:  Results for orders placed or performed during the hospital encounter of 09/27/2018 (from the past 48 hour(s))  CBG monitoring, ED     Status: Abnormal   Collection Time: 10/01/2018  9:55 PM  Result Value Ref Range   Glucose-Capillary 132 (H) 70 - 99 mg/dL  Protime-INR     Status: None   Collection Time: 09/26/2018 10:35 PM  Result Value Ref Range   Prothrombin Time 13.4 11.4 - 15.2 seconds   INR 1.0 0.8 - 1.2    Comment: (NOTE) INR goal varies based on device and disease states. Performed at [facility], [address] [place], [place] [address]   APTT     Status: None   Collection Time: 09/23/2018 10:35 PM  Result Value Ref Range   aPTT 26 24  - 36 seconds    Comment: Performed at [facility], [address] [place], [place] [address]  CBC     Status: Abnormal   Collection Time: 09/24/2018 10:35 PM  Result Value Ref Range   WBC 6.7 4.0 - 10.5 K/uL   RBC 3.23 (L) 3.87 - 5.11 MIL/uL   Hemoglobin 11.6 (L) 12.0 - 15.0 g/dL   HCT 34.8 (L) 36.0 - 46.0 %   MCV 107.7 (H) 80.0 - 100.0 fL   MCH 35.9 (H) 26.0 - 34.0 pg   MCHC 33.3 30.0 - 36.0 g/dL   RDW 15.2 11.5 - 15.5 %   Platelets 106 (L) 150 - 400 K/uL    Comment: SPECIMEN CHECKED FOR CLOTS   nRBC 0.0 0.0 - 0.2 %    Comment: Performed at [facility], [address], [place], [place] [address]  Differential     Status: Abnormal   Collection Time: 09/15/2018 10:35 PM  Result Value Ref Range   Neutrophils Relative % 44 %   Neutro Abs 2.9 1.7 - 7.7 K/uL   Lymphocytes Relative 42 %   Lymphs Abs 2.8 0.7 - 4.0 K/uL   Monocytes Relative 8 %   Monocytes Absolute 0.5 0.1 - 1.0 K/uL   Eosinophils Relative 4 %   Eosinophils Absolute 0.3 0.0 - 0.5 K/uL   Basophils Relative 1 %   Basophils Absolute 0.0 0.0 - 0.1 K/uL   Immature Granulocytes  1 %   Abs Immature Granulocytes 0.08 (H) 0.00 - 0.07 K/uL    Comment: Performed at [facility], [address] [place], [place] [address]  Comprehensive metabolic panel     Status: Abnormal   Collection Time: 09/19/2018 10:35 PM  Result Value Ref Range   Sodium 141 135 - 145 mmol/L   Potassium 3.5 3.5 - 5.1 mmol/L   Chloride 107 98 - 111 mmol/L   CO2 25 22 - 32 mmol/L   Glucose, Bld 137 (H) 70 - 99 mg/dL   BUN 31 (H) 8 - 23 mg/dL   Creatinine, Ser 1.60 (H) 0.44 - 1.00 mg/dL   Calcium 9.3 8.9 - 10.3 mg/dL   Total Protein 7.0 6.5 - 8.1 g/dL   Albumin 4.5 3.5 - 5.0 g/dL   AST 72 (H) 15 - 41 U/L   ALT 44 0 - 44 U/L   Alkaline Phosphatase 86 38 - 126 U/L   Total Bilirubin 0.3 0.3 - 1.2 mg/dL   GFR calc non Af Amer 31 (L) >60 mL/min   GFR calc Af Amer 35 (L) >60 mL/min   Anion gap 9 5 - 15    Comment: Performed at [facility], [address] [place], [place] [address]  Troponin I - ONCE - STAT     Status: Abnormal   Collection Time: 10/01/2018 10:35 PM  Result Value Ref Range   Troponin I 0.03 (HH) <0.03 ng/mL    Comment: CRITICAL RESULT CALLED TO, READ BACK BY AND VERIFIED WITH: POINDEXTER,M @ 2306 ON 09/22/2018 BY JUW Performed at [facility], [address] [place], [place], [place] [address]   Brain natriuretic peptide     Status: Abnormal   Collection Time: 09/26/2018 10:35 PM  Result Value Ref Range   B Natriuretic Peptide 378.0 (H) 0.0 - 100.0 pg/mL    Comment: Performed at [facility], [address] [place], [place] [address]    ABGS No results for input(s): PHART, PO2ART, TCO2, HCO3 in the last 72 hours.  Invalid input(s): PCO2 CULTURES No results found for this or any previous visit (from the past 240 hour(s)). Studies/Results: Ct Cervical Spine Wo Contrast  Result Date: 09/19/2018 CLINICAL DATA:  Code stroke. Altered level of consciousness. Unresponsive. Nonreactive pupils. EXAM: CT HEAD WITHOUT CONTRAST CT CERVICAL SPINE WITHOUT CONTRAST TECHNIQUE: Multidetector CT imaging of the head and cervical spine was performed following the standard protocol without intravenous contrast. Multiplanar CT image reconstructions of the cervical spine were also generated. COMPARISON:  None. FINDINGS: CT HEAD FINDINGS BRAIN: 5.1 x 7.8 x 4.4 cm (volume = 92 cc) RIGHT temporoparietal, operculum, insula and RIGHT basal ganglia hemorrhage with intraventricular extension. Surrounding vasogenic edema and regional mass effect. 16 mm RIGHT to LEFT midline shift. Effaced RIGHT lateral ventricle with LEFT ventricle entrapment. Dependent blood products within the ventricles including fourth ventricle. Basal cisterns effacement. RIGHT uncal herniation. No acute large vascular territory infarct. VASCULAR: Moderate calcific atherosclerosis of the carotid siphons. SKULL: No skull fracture. No significant scalp soft tissue swelling. SINUSES/ORBITS: Trace paranasal  sinus mucosal thickening. Mastoid air cells are well aerated.The included ocular globes and orbital contents are non-suspicious. OTHER: Life support lines in place. ASPECTS [facility] Stroke Program Early CT Score) -not applicable. CT CERVICAL SPINE FINDINGS ALIGNMENT: Maintained lordosis. Grade 1 C2-3 and C3-4 retrolisthesis. Minimal grade 1 C5-6 anterolisthesis. SKULL BASE AND VERTEBRAE: Vertebral bodies intact. LEFT C5-6 facets fused on degenerative basis. Multilevel severe RIGHT facet arthropathy. Moderate C2-3, C3-4 and C6-7 disc height loss  with endplate spurring compatible with degenerative discs. No destructive bony lesions. C1-2 articulation maintained. SOFT TISSUES AND SPINAL CANAL: Nonacute. Life support lines in place. Moderate calcific atherosclerosis carotid arteries. DISC LEVELS: Severe canal stenosis C3-4. Severe bilateral C3-4 and LEFT C6-7 neural foraminal narrowing. UPPER CHEST: Lung apices are clear. OTHER: None. IMPRESSION: CT HEAD: 1. Acute 92 cc RIGHT cerebral hemorrhage with intraventricular extension. RIGHT uncal herniation and 16 mm RIGHT to LEFT midline shift with LEFT ventricle entrapment. CT CERVICAL SPINE: 1. No fracture. C2-3 and C3-4 grade 1 spondylolisthesis on degenerative basis. 2. Severe canal stenosis C3-4. Severe C3-4 and C6-7 neural foraminal narrowing. Critical Value/emergent results were called by telephone at the time of interpretation on 10/07/2018 at 10:37 pm to Dr. Noemi Chapel , who verbally acknowledged these results. Electronically Signed   By: Elon Alas M.D.   On: 09/21/2018 22:40   Dg Chest Portable 1 View  Result Date: 09/22/2018 CLINICAL DATA:  Post intubation.  NG tube placement. EXAM: PORTABLE CHEST 1 VIEW COMPARISON:  April 05, 2017 FINDINGS: The ETT is in good position. The NG tube terminates below today's film. No pneumothorax. The lungs are clear. Cardiomediastinal silhouette is unchanged. IMPRESSION: Support apparatus as above.  No other acute  abnormalities. Electronically Signed   By: Dorise Bullion III M.D   On: 10/02/2018 22:18   Ct Head Code Stroke Wo Contrast  Result Date: 10/05/2018 CLINICAL DATA:  Code stroke. Altered level of consciousness. Unresponsive. Nonreactive pupils. EXAM: CT HEAD WITHOUT CONTRAST CT CERVICAL SPINE WITHOUT CONTRAST TECHNIQUE: Multidetector CT imaging of the head and cervical spine was performed following the standard protocol without intravenous contrast. Multiplanar CT image reconstructions of the cervical spine were also generated. COMPARISON:  None. FINDINGS: CT HEAD FINDINGS BRAIN: 5.1 x 7.8 x 4.4 cm (volume = 92 cc) RIGHT temporoparietal, operculum, insula and RIGHT basal ganglia hemorrhage with intraventricular extension. Surrounding vasogenic edema and regional mass effect. 16 mm RIGHT to LEFT midline shift. Effaced RIGHT lateral ventricle with LEFT ventricle entrapment. Dependent blood products within the ventricles including fourth ventricle. Basal cisterns effacement. RIGHT uncal herniation. No acute large vascular territory infarct. VASCULAR: Moderate calcific atherosclerosis of the carotid siphons. SKULL: No skull fracture. No significant scalp soft tissue swelling. SINUSES/ORBITS: Trace paranasal sinus mucosal thickening. Mastoid air cells are well aerated.The included ocular globes and orbital contents are non-suspicious. OTHER: Life support lines in place. ASPECTS [facility] Stroke Program Early CT Score) -not applicable. CT CERVICAL SPINE FINDINGS ALIGNMENT: Maintained lordosis. Grade 1 C2-3 and C3-4 retrolisthesis. Minimal grade 1 C5-6 anterolisthesis. SKULL BASE AND VERTEBRAE: Vertebral bodies intact. LEFT C5-6 facets fused on degenerative basis. Multilevel severe RIGHT facet arthropathy. Moderate C2-3, C3-4 and C6-7 disc height loss with endplate spurring compatible with degenerative discs. No destructive bony lesions. C1-2 articulation maintained. SOFT TISSUES AND SPINAL CANAL: Nonacute. Life support  lines in place. Moderate calcific atherosclerosis carotid arteries. DISC LEVELS: Severe canal stenosis C3-4. Severe bilateral C3-4 and LEFT C6-7 neural foraminal narrowing. UPPER CHEST: Lung apices are clear. OTHER: None. IMPRESSION: CT HEAD: 1. Acute 92 cc RIGHT cerebral hemorrhage with intraventricular extension. RIGHT uncal herniation and 16 mm RIGHT to LEFT midline shift with LEFT ventricle entrapment. CT CERVICAL SPINE: 1. No fracture. C2-3 and C3-4 grade 1 spondylolisthesis on degenerative basis. 2. Severe canal stenosis C3-4. Severe C3-4 and C6-7 neural foraminal narrowing. Critical Value/emergent results were called by telephone at the time of interpretation on 09/13/2018 at 10:37 pm to Dr. Noemi Chapel , who verbally acknowledged these results.  Electronically Signed   By: Elon Alas M.D.   On: 09/15/2018 22:40    Medications:  Prior to Admission:  Medications Prior to Admission  Medication Sig Dispense Refill Last Dose  . ALPRAZolam (XANAX) 1 MG tablet Take 1 mg by mouth 2 (two) times daily as needed for anxiety or sleep.    Taking  . butorphanol (STADOL) 10 MG/ML nasal spray Place 1 spray into the nose every 4 (four) hours as needed for migraine (migraines).    Taking  . cyanocobalamin (,VITAMIN B-12,) 1000 MCG/ML injection INJECT 1ML EVERY MONTH  0 Taking  . gabapentin (NEURONTIN) 100 MG capsule Take 1 capsule (100 mg total) by mouth at bedtime. May increase as tolerated up tp 3 capsules at night 90 capsule 1 Taking  . imatinib (GLEEVEC) 400 MG tablet TAKE 1 TABLET BY MOUTH EVERY DAY with food AND a large GLASS of water *always USE secondary insurance [employer] ON this rx 30 tablet 5 Taking  . lidocaine (LIDODERM) 5 % Place 1 patch onto the skin daily as needed (pain). Remove & Discard patch within 12 hours or as directed by MD   Taking  . loperamide (IMODIUM) 2 MG capsule Take 2 mg by mouth as needed for diarrhea or loose stools.   Taking  . Multiple Vitamin (MULTIVITAMIN) tablet Take  1 tablet by mouth daily.   Taking  . NON FORMULARY Vitamin B12 sq once a month   Taking  . potassium chloride (K-DUR,KLOR-CON) 10 MEQ tablet    Taking  . promethazine (PHENERGAN) 25 MG tablet Take 12.5 mg by mouth 4 (four) times daily as needed for nausea or vomiting (when experiencing a migraine).   5 Taking  . pyridOXINE (VITAMIN B-6) 100 MG tablet Take 100 mg by mouth 2 (two) times daily.   Taking  . sertraline (ZOLOFT) 50 MG tablet Take 50 mg by mouth daily.   Taking  . SUMAtriptan (IMITREX) 100 MG tablet Take 100 mg by mouth every 2 (two) hours as needed for migraine. May repeat in 2 hours if headache persists or recurs.   Taking  . SYNTHROID 100 MCG tablet Take 100 mcg by mouth daily before breakfast.   2 Taking   Scheduled: . chlorhexidine  15 mL Mouth Rinse BID  . Chlorhexidine Gluconate Cloth  6 each Topical Q0600  . mouth rinse  15 mL Mouth Rinse q12n4p  . sodium chloride flush  3 mL Intravenous Once   Continuous: . niCARDipine 2.5 mg/hr (Oct 12, 2018 0622)  . propofol (DIPRIVAN) infusion 5 mcg/kg/min (09/14/2018 2216)   [id number] **OR** acetaminophen  Assesment: She has an intracerebral bleed.  This is not felt to be a survivable event.  She has what looks like uncal herniation on CT.  She also has severe narrowing in the cervical spine.  She did fall at home.  At baseline she has leukemia and has been on [place] for many years and has done well  At baseline she has severe chronic migraine headaches  She has had hypertension and has been on Cardene drip Active Problems:   Intracerebral bleed ([place])    Plan: For probable compassionate extubation later today after family arrives    LOS: 0 days   [name] 12-Oct-2018, 7:57 AM

## 2018-10-10 NOTE — Consult Note (Addendum)
Consultation Note Date: October 09, 2018   Patient Name: Ana Nelson  DOB: [birth year]  MRN: [id number]  Age / Sex: 78 y.o., female  PCP: Sinda Du, MD Referring Physician: Sinda Du, MD  Reason for Consultation: Terminal Care and Withdrawal of life-sustaining treatment  HPI/Patient Profile: 78 y.o. 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   Clinical Assessment and Goals of Care: I met today at Ana Nelson's bedside after being called by RN. Multiple family members present including husband, children, siblings, and multiple extended family members. Family have good understanding of poor prognosis and that Ana Nelson is at the end of life. They understand and agree with next step to liberate her from ventilator and allow for a more natural passing.   I was present for extubation and assisted RN with guidance for medication administration to ensure comfort. At this time I chose to continue propofol at low dose as I did not wish to delay extubation with a transition to morphine infusion. I provided morphine and ativan bolus prior to extubation to ensure comfort and for seizure prevention. Provided presence and emotional support to family at bedside with assistance of chaplain - appreciate assistance.   Ana Nelson was pronounced deceased at 4 along with RN Nira Conn. Asystole, no breath/cardiac sounds, no pulse.   Primary Decision Maker NEXT OF KIN husband and children    SUMMARY OF RECOMMENDATIONS   - Proceeding with comfort care and extubation  Code Status/Advance Care Planning:  DNR   Symptom Management:   Propofol maintained at 5 mcg/kg/min.  PRN  morphine provided. PRN dilaudid provided when morphine not providing enough relief.  PRN Ativan for seizure prevention.   Palliative Prophylaxis:   Frequent Pain Assessment  Additional Recommendations (Limitations, Scope, Preferences):  Full Comfort Care  Psycho-social/Spiritual:   Desire for further Chaplaincy support:yes  Additional Recommendations: Compassionate Wean Education and Grief/Bereavement Support  Prognosis:   Hours - Days  Discharge Planning: Anticipated Hospital Death      Primary Diagnoses: Present on Admission: . Intracerebral bleed ([place]) . Intracerebral hemorrhage ([place])   I have reviewed the medical record, interviewed the patient and family, and examined the patient. The following aspects are pertinent.  Past Medical History:  Diagnosis Date  . Blindness of right eye   . CML (chronic myelocytic leukemia) ([place])   . CML (chronic myelocytic leukemia) ([place]) 05/04/2012  . Depression   . Diverticulitis   . Drooping eyelid    left eye  . GERD (gastroesophageal reflux disease)   . H/O: CML (chronic myeloid leukemia)   . History of hiatal hernia   . History of migraine headaches    "continuous migraines" per Heme/Onc note 04/2015  . Hypothyroidism   . Injury of left shoulder   . Osteoarthritis   . Osteoarthritis    right knee  . Right knee pain   . Shingles   .  Wears glasses    Social History   Socioeconomic History  . Marital status: Married    Spouse name: Not on file  . Number of children: Not on file  . Years of education: Not on file  . Highest education level: Not on file  Occupational History  . Not on file  Social Needs  . Financial resource strain: Not on file  . Food insecurity:    Worry: Not on file    Inability: Not on file  . Transportation needs:    Medical: Not on file    Non-medical: Not on file  Tobacco Use  . Smoking status: Never Smoker  . Smokeless tobacco: Never Used  Substance and Sexual Activity  . Alcohol  use: No  . Drug use: No  . Sexual activity: Never  Lifestyle  . Physical activity:    Days per week: Not on file    Minutes per session: Not on file  . Stress: Not on file  Relationships  . Social connections:    Talks on phone: Not on file    Gets together: Not on file    Attends religious service: Not on file    Active member of club or organization: Not on file    Attends meetings of clubs or organizations: Not on file    Relationship status: Not on file  Other Topics Concern  . Not on file  Social History Narrative  . Not on file   Family History  Problem Relation Age of Onset  . Cancer Maternal Uncle        bone  . Cancer Paternal Aunt        uterine  . Cancer Maternal Grandmother        breast  . Cancer Maternal Grandfather        throat  . Cancer Paternal Grandmother        leukemia  . Cancer Son        colon  . Stroke Mother   . COPD Father    Scheduled Meds: . chlorhexidine  15 mL Mouth Rinse BID  . Chlorhexidine Gluconate Cloth  6 each Topical Q0600  . mouth rinse  15 mL Mouth Rinse q12n4p  . sodium chloride flush  3 mL Intravenous Once   Continuous Infusions: . dextrose    . niCARDipine 5 mg/hr (2018-09-27 1224)  . propofol (DIPRIVAN) infusion 5 mcg/kg/min (09/23/2018 2216)   PRN Meds:.acetaminophen **OR** acetaminophen, acetaminophen **OR** acetaminophen, diphenhydrAMINE, glycopyrrolate **OR** glycopyrrolate **OR** glycopyrrolate, LORazepam, morphine injection, morphine injection, polyvinyl alcohol Allergies  Allergen Reactions  . Nubain [Nalbuphine Hcl] Swelling    Tongue swells  . Perphenazine     seizures  . Vistaril [Hydroxyzine Hcl] Swelling and Other (See Comments)    TONGUE SWELLS  . Aspirin     History of Leukemia   Review of Systems  Unable to perform ROS: Acuity of condition    Physical Exam Vitals signs and nursing note reviewed.  Constitutional:      Appearance: She is ill-appearing and toxic-appearing.     Comments: Pallor     Pulmonary:     Comments: Agonal respirations Neurological:     Mental Status: She is unresponsive.     Vital Signs: BP (!) 108/47   Pulse 89   Temp 99.4 F (37.4 C)   Resp 14   Ht 5' (1.524 m)   Wt 53.9 kg   SpO2 100%   BMI 23.21 kg/m  Pain Scale: CPOT  SpO2: SpO2: 100 % O2 Device:SpO2: 100 % O2 Flow Rate: .   IO: Intake/output summary:   Intake/Output Summary (Last 24 hours) at September 25, 2018 1347 Last data filed at 2018-09-25 7319 Gross per 24 hour  Intake 117.94 ml  Output 1050 ml  Net -932.06 ml    LBM:   Baseline Weight: Weight: 51.6 kg Most recent weight: Weight: 53.9 kg     Palliative Assessment/Data:   Flowsheet Rows     Most Recent Value  Intake Tab  Referral Department  Hospitalist  Unit at Time of Referral  ICU  Palliative Care Primary Diagnosis  Neurology  Date Notified  Sep 25, 2018  Palliative Care Type  New Palliative care  Reason for referral  Clarify Goals of Care  Date of Admission  09/14/2018  # of days IP prior to Palliative referral  1  Clinical Assessment  Psychosocial & Spiritual Assessment  Palliative Care Outcomes      Time In: 1330 Time Out: 1450 Time Total: 80 min Greater than 50%  of this time was spent counseling and coordinating care related to the above assessment and plan.  Signed by: Vinie Sill, NP Palliative Medicine Team Pager # [phone] (M-F 8a-5p) Team Phone # [phone] (Nights/Weekends)

## 2018-10-10 DEATH — deceased

## 2018-11-21 ENCOUNTER — Other Ambulatory Visit (HOSPITAL_COMMUNITY): Payer: Medicare Other

## 2018-11-28 ENCOUNTER — Ambulatory Visit (HOSPITAL_COMMUNITY): Payer: Medicare Other | Admitting: Hematology
